# Patient Record
Sex: Male | Born: 1940
Health system: Southern US, Community
[De-identification: ages and names within clinical notes are randomized; demographics above are authoritative.]

## PROBLEM LIST (undated history)

## (undated) ENCOUNTER — Ambulatory Visit: Admission: EM

## (undated) DIAGNOSIS — T8859XA Other complications of anesthesia, initial encounter: Secondary | ICD-10-CM

## (undated) DIAGNOSIS — I509 Heart failure, unspecified: Secondary | ICD-10-CM

## (undated) DIAGNOSIS — C44319 Basal cell carcinoma of skin of other parts of face: Secondary | ICD-10-CM

## (undated) DIAGNOSIS — C61 Malignant neoplasm of prostate: Secondary | ICD-10-CM

## (undated) DIAGNOSIS — T4145XA Adverse effect of unspecified anesthetic, initial encounter: Secondary | ICD-10-CM

## (undated) DIAGNOSIS — C189 Malignant neoplasm of colon, unspecified: Secondary | ICD-10-CM

## (undated) DIAGNOSIS — M1990 Unspecified osteoarthritis, unspecified site: Secondary | ICD-10-CM

## (undated) DIAGNOSIS — IMO0002 Reserved for concepts with insufficient information to code with codable children: Secondary | ICD-10-CM

## (undated) DIAGNOSIS — H6120 Impacted cerumen, unspecified ear: Secondary | ICD-10-CM

## (undated) DIAGNOSIS — K2210 Ulcer of esophagus without bleeding: Secondary | ICD-10-CM

## (undated) DIAGNOSIS — I480 Paroxysmal atrial fibrillation: Secondary | ICD-10-CM

## (undated) HISTORY — DX: Reserved for concepts with insufficient information to code with codable children: IMO0002

## (undated) HISTORY — DX: Unspecified osteoarthritis, unspecified site: M19.90

## (undated) HISTORY — DX: Ulcer of esophagus without bleeding: K22.10

## (undated) HISTORY — DX: Basal cell carcinoma of skin of other parts of face: C44.319

## (undated) HISTORY — DX: Malignant neoplasm of prostate: C61

## (undated) HISTORY — DX: Paroxysmal atrial fibrillation: I48.0

## (undated) HISTORY — PX: CATARACT EXTRACTION: SUR2

## (undated) HISTORY — DX: Heart failure, unspecified: I50.9

## (undated) HISTORY — PX: BASAL CELL CARCINOMA EXCISION: SHX1214

## (undated) HISTORY — DX: Malignant neoplasm of colon, unspecified: C18.9

---

## 1996-11-12 DIAGNOSIS — C61 Malignant neoplasm of prostate: Secondary | ICD-10-CM

## 1996-11-12 HISTORY — DX: Malignant neoplasm of prostate: C61

## 1996-11-12 HISTORY — PX: RADIOACTIVE SEED IMPLANT: SHX5150

## 1999-01-11 HISTORY — PX: COLONOSCOPY: SHX174

## 1999-01-17 ENCOUNTER — Ambulatory Visit (HOSPITAL_COMMUNITY): Admission: RE | Admit: 1999-01-17 | Discharge: 1999-01-17 | Payer: Self-pay | Admitting: Gastroenterology

## 1999-01-17 LAB — HM COLONOSCOPY

## 2001-09-02 ENCOUNTER — Encounter: Payer: Self-pay | Admitting: Internal Medicine

## 2001-09-02 ENCOUNTER — Inpatient Hospital Stay (HOSPITAL_COMMUNITY): Admission: EM | Admit: 2001-09-02 | Discharge: 2001-09-05 | Payer: Self-pay | Admitting: Emergency Medicine

## 2001-09-05 ENCOUNTER — Encounter: Payer: Self-pay | Admitting: Internal Medicine

## 2001-10-02 ENCOUNTER — Encounter: Payer: Self-pay | Admitting: "Specialist/Technologist

## 2001-10-02 ENCOUNTER — Encounter: Admission: RE | Admit: 2001-10-02 | Discharge: 2001-10-02 | Payer: Self-pay | Admitting: "Specialist/Technologist

## 2001-11-17 ENCOUNTER — Encounter: Admission: RE | Admit: 2001-11-17 | Discharge: 2001-11-17 | Payer: Self-pay

## 2002-07-27 ENCOUNTER — Ambulatory Visit (HOSPITAL_BASED_OUTPATIENT_CLINIC_OR_DEPARTMENT_OTHER): Admission: RE | Admit: 2002-07-27 | Discharge: 2002-07-27 | Payer: Self-pay | Admitting: Surgery

## 2002-07-27 ENCOUNTER — Encounter (INDEPENDENT_AMBULATORY_CARE_PROVIDER_SITE_OTHER): Payer: Self-pay | Admitting: Specialist

## 2002-11-12 HISTORY — PX: SHOULDER SURGERY: SHX246

## 2004-03-24 ENCOUNTER — Observation Stay (HOSPITAL_COMMUNITY): Admission: RE | Admit: 2004-03-24 | Discharge: 2004-03-25 | Payer: Self-pay | Admitting: Orthopedic Surgery

## 2005-09-19 ENCOUNTER — Encounter: Admission: RE | Admit: 2005-09-19 | Discharge: 2005-09-19 | Payer: Self-pay | Admitting: Internal Medicine

## 2005-11-12 HISTORY — PX: INGUINAL HERNIA REPAIR: SUR1180

## 2005-11-12 HISTORY — PX: CARDIAC CATHETERIZATION: SHX172

## 2005-12-06 ENCOUNTER — Encounter: Admission: RE | Admit: 2005-12-06 | Discharge: 2005-12-06 | Payer: Self-pay | Admitting: Internal Medicine

## 2006-03-27 ENCOUNTER — Emergency Department (HOSPITAL_COMMUNITY): Admission: EM | Admit: 2006-03-27 | Discharge: 2006-03-27 | Payer: Self-pay | Admitting: Emergency Medicine

## 2006-04-02 ENCOUNTER — Ambulatory Visit (HOSPITAL_COMMUNITY): Admission: RE | Admit: 2006-04-02 | Discharge: 2006-04-02 | Payer: Self-pay | Admitting: Surgery

## 2006-04-14 ENCOUNTER — Encounter: Payer: Self-pay | Admitting: Emergency Medicine

## 2006-04-14 ENCOUNTER — Inpatient Hospital Stay (HOSPITAL_COMMUNITY): Admission: AD | Admit: 2006-04-14 | Discharge: 2006-04-20 | Payer: Self-pay | Admitting: Cardiology

## 2006-04-18 ENCOUNTER — Encounter (INDEPENDENT_AMBULATORY_CARE_PROVIDER_SITE_OTHER): Payer: Self-pay | Admitting: Cardiology

## 2008-11-12 HISTORY — PX: CARDIOVERSION: SHX1299

## 2009-09-25 ENCOUNTER — Inpatient Hospital Stay (HOSPITAL_COMMUNITY): Admission: EM | Admit: 2009-09-25 | Discharge: 2009-09-26 | Payer: Self-pay | Admitting: Emergency Medicine

## 2009-11-01 ENCOUNTER — Ambulatory Visit (HOSPITAL_COMMUNITY): Admission: RE | Admit: 2009-11-01 | Discharge: 2009-11-01 | Payer: Self-pay | Admitting: Cardiovascular Disease

## 2009-12-12 ENCOUNTER — Encounter: Payer: Self-pay | Admitting: Cardiovascular Disease

## 2010-01-24 ENCOUNTER — Telehealth: Payer: Self-pay | Admitting: Cardiovascular Disease

## 2010-01-25 DIAGNOSIS — I509 Heart failure, unspecified: Secondary | ICD-10-CM | POA: Insufficient documentation

## 2010-01-25 DIAGNOSIS — C61 Malignant neoplasm of prostate: Secondary | ICD-10-CM | POA: Insufficient documentation

## 2010-01-25 DIAGNOSIS — K4020 Bilateral inguinal hernia, without obstruction or gangrene, not specified as recurrent: Secondary | ICD-10-CM | POA: Insufficient documentation

## 2010-01-25 DIAGNOSIS — M889 Osteitis deformans of unspecified bone: Secondary | ICD-10-CM | POA: Insufficient documentation

## 2010-02-02 ENCOUNTER — Ambulatory Visit: Payer: Self-pay | Admitting: Cardiovascular Disease

## 2010-02-02 LAB — CONVERTED CEMR LAB

## 2010-02-10 ENCOUNTER — Ambulatory Visit: Payer: Self-pay | Admitting: Cardiovascular Disease

## 2010-02-10 DIAGNOSIS — I480 Paroxysmal atrial fibrillation: Secondary | ICD-10-CM | POA: Insufficient documentation

## 2010-02-13 LAB — CONVERTED CEMR LAB
ALT: 16 units/L (ref 0–53)
CO2: 27 meq/L (ref 19–32)
Calcium: 9.4 mg/dL (ref 8.4–10.5)
Chloride: 105 meq/L (ref 96–112)
Creatinine, Ser: 0.99 mg/dL (ref 0.40–1.50)
Glucose, Bld: 80 mg/dL (ref 70–99)
TSH: 2.126 microintl units/mL (ref 0.350–4.500)
Total Bilirubin: 0.6 mg/dL (ref 0.3–1.2)

## 2010-05-08 ENCOUNTER — Encounter: Payer: Self-pay | Admitting: Cardiovascular Disease

## 2010-10-16 ENCOUNTER — Ambulatory Visit: Payer: Self-pay | Admitting: Cardiovascular Disease

## 2010-12-10 LAB — CONVERTED CEMR LAB
ALT: 19 units/L (ref 0–53)
AST: 21 units/L (ref 0–37)
Bilirubin, Direct: 0.2 mg/dL (ref 0.0–0.3)
Indirect Bilirubin: 0.7 mg/dL (ref 0.0–0.9)
PSA: 0.07 ng/mL (ref <=4.00)
TSH: 2.278 microintl units/mL (ref 0.350–4.500)
Total Bilirubin: 0.9 mg/dL (ref 0.3–1.2)
VLDL: 11 mg/dL (ref 0–40)

## 2010-12-14 NOTE — Assessment & Plan Note (Signed)
Summary: NP6/AMD   Visit Type:  New Patient Referring Provider:  Gollan Primary Provider:  n/a  CC:  no complaints.  History of Present Illness: [name] is a very pleasant 70 year old gentleman, known to me from [facility] heart and vascular [facility], history of atrial fibrillation with cardioversion in December of 2010 who presents to establish care.  [name] has had 2 episodes of fibrillation, in December and then several years prior to that that converted with medical management. His most recent episode did not convert with amiodarone loading and required cardioversion. He has been maintained on low-dose metoprolol tartrate and low-dose amiodarone has been doing well. He has noticed some gum bleeding and blood in his stools and he would like to stop his warfarin. He carefully monitors his heart rates and is symptomatic when in atrial fibrillation and does not feel that he has had any episodes whatsoever.  He otherwise feels well, exercises on a daily basis. He believes his last cholesterol was 170.   he had a cardiac catheterization in June 2007 that showed no significant coronary artery disease. Ejection fraction at that time was 45-55% and he was in atrial fibrillation with a rapid rate.    Preventive Screening-Counseling & Management  Alcohol-Tobacco     Smoking Status: never  Caffeine-Diet-Exercise     Does Patient Exercise: yes      Drug Use:  no.    Current Problems (verified): 1)  Paget's Disease  (ICD-731.0) 2)  Prostate Cancer  (ICD-185) 3)  Inguinal Hernias, Bilateral  (ICD-550.92) 4)  CHF  (ICD-428.0)  Current Medications (verified): 1)  Metoprolol Tartrate 25 Mg Tabs (Metoprolol Tartrate) .... 1/2  Tablet By Mouth Twice A Day 2)  Furosemide 20 Mg Tabs (Furosemide) .... Take One Tablet By Mouth Daily As Needed 3)  Warfarin Sodium 5 Mg Tabs (Warfarin Sodium) .... Use As Directed By Anticoagulation Clinic 4)  Multivitamins  Tabs (Multiple Vitamin) .Marland Kitchen.. 1 Tab  By Mouth Daily 5)  Vitamin D (Ergocalciferol) 50000 Unit Caps (Ergocalciferol) .Marland Kitchen.. 1 Tab Weekly 6)  Amiodarone Hcl 200 Mg Tabs (Amiodarone Hcl) .... Take One Tablet By Mouth Daily 7)  Omega-3 & Omega-6 Fish Oil  Caps (Omega 3-6-9 Fatty Acids) .Marland Kitchen.. 1 By Mouth Two Times A Day 8)  Mag-Ox 400 400 Mg Tabs (Magnesium Oxide) .Marland Kitchen.. 1 By Mouth Two Times A Day  Allergies (verified): No Known Drug Allergies  Past History:  Past Medical History: Last updated: 01/25/2010 Atrial Fibrillation -- DCCV 11/01/09 inguinal hernia -- repair 2007 Congestive heart failure secondary to atrial fibrillation with diastolic dysfunction History of Paget disease in remission  History of prostate cancer in remission Anticoagulation on Coumadin therapy  Past Surgical History: Last updated: 01/25/2010 inguinal hernia repair 1981, 2007 cardiac cath- 2007 -- clean  Family History: Mother: deceased 49: ?a-fib Father: deceased 4: stroke  Social History: Tobacco Use - No.  Alcohol Use - yes Regular Exercise - yes Drug Use - no Retired  Married  Smoking Status:  never Does Patient Exercise:  yes Drug Use:  no  Review of Systems  The patient denies fever, weight loss, weight gain, vision loss, decreased hearing, hoarseness, chest pain, syncope, dyspnea on exertion, peripheral edema, prolonged cough, abdominal pain, incontinence, muscle weakness, depression, and enlarged lymph nodes.    Vital Signs:  Patient profile:   70 year old male Height:      71 inches Weight:      195.50 pounds BMI:     27.37 Pulse rate:  51 / minute Pulse rhythm:   regular BP sitting:   146 / 86  (left arm) Cuff size:   large  Vitals Entered By: Mercer Pod (February 10, 2010 11:10 AM)  Physical Exam  General:  well-appearing gentleman in no apparent distress, HEENT exam is benign, neck is supple with no JVP or carotid bruits, heart sounds are regular with S1-S2 and no murmurs appreciated, lungs are clear to  auscultation with no wheezes Rales, abdominal exam is benign, no significant lower extremity edema, neurologic exam is grossly nonfocal, skin is warm and dry.pulses are equal and symmetrical in his upper and lower extremities.    Impression & Recommendations:  Problem # 1:  ATRIAL FIBRILLATION (ICD-427.31) maintaining normal sinus rhythm after it to fibrillation with DC cardioversion in December 2010. Previous episode of atrial fibrillation in June 2007.  We will decrease his amiodarone to 100 mg daily. He was hoping to stop his amiodarone altogether though he does not want to go back into atrial fibrillation. We will compromise and do a low dose for several months. We will check his LFTs and TSH today. We'll continue him on a low-dose metoprolol tartrate b.i.d. We have suggested to him that he could come off warfarin as his nature fibrillation has been very rare and he has been holding sinus rhythm.  He'll contact us if he has any palpitations concerning for atrial fibrillation. We have suggested to him that he take some amiodarone with him when he travels in case he does convert back to atrial fibrillation. His updated medication list for this problem includes:    Metoprolol Tartrate 25 Mg Tabs (Metoprolol tartrate) .Marland Kitchen... 1/2  tablet by mouth twice a day    Warfarin Sodium 5 Mg Tabs (Warfarin sodium) ..... Use as directed by anticoagulation clinic    Amiodarone Hcl 200 Mg Tabs (Amiodarone hcl) .Marland Kitchen... Take one tablet by mouth daily  Appended Document: NP6/AMD EKG in November 25, 2009 shows normal sinus rhythm, rate 52 beats per minute with no significant ST or T wave changes.  Appended Document: NP6/AMD    Clinical Lists Changes  Orders: Added new Test order of T-Comprehensive Metabolic Panel [phone]) - Signed Added new Test order of T-TSH [phone]) - Signed

## 2010-12-14 NOTE — Medication Information (Signed)
Summary: Coumadin Clinic  Anticoagulant Therapy  Managed by: Inactive PCP: n/a Supervising MD: gollan Indication 1: Atrial Fibrillation Lab Used: [facility] [place] Site: [place] INR RANGE 2.0-3.0          Comments: Per Dr Windell Hummingbird office note 02/10/10 pt is now off of coumadin.  Allergies: No Known Drug Allergies  Anticoagulation Management History:      Positive risk factors for bleeding include an age of 72 years or older.  The bleeding index is 'intermediate risk'.  Positive CHADS2 values include History of CHF.  Negative CHADS2 values include Age > 72 years old.  Anticoagulation responsible provider: gollan.    Anticoagulation Management Assessment/Plan:      The next INR is due 02/09/2010.  Anticoagulation instructions were given to patient.  Results were reviewed/authorized by Inactive.         Prior Anticoagulation Instructions: coumadin 2.5 mg daily with 5 mg on Tues and Thurs

## 2010-12-14 NOTE — Medication Information (Signed)
Summary: CCR/AMD  Anticoagulant Therapy  Managed by: Charlena Cross, RN, BSN Supervising MD: Mariah Milling Indication 1: Atrial Fibrillation Lab Used: [facility] [place] Site: [place] INR POC 1.2 INR RANGE 2.0-3.0  Dietary changes: no    Health status changes: no    Bleeding/hemorrhagic complications: no    Recent/future hospitalizations: no    Any changes in medication regimen? no    Recent/future dental: no  Any missed doses?: no       Is patient compliant with meds? yes      Comments: had bleeding issues on 5 mg daily  Anticoagulation Management History:      The patient comes in today for his initial visit for anticoagulation therapy.  Positive risk factors for bleeding include an age of 72 years or older.  The bleeding index is 'intermediate risk'.  Positive CHADS2 values include History of CHF.  Negative CHADS2 values include Age > 63 years old.  Anticoagulation responsible provider: gollan.  INR POC: 1.2.    Anticoagulation Management Assessment/Plan:      The patient's current anticoagulation dose is Warfarin sodium 5 mg tabs: Use as directed by Anticoagulation Clinic.  The next INR is due 02/09/2010.  Anticoagulation instructions were given to patient.  Results were reviewed/authorized by Charlena Cross, RN, BSN.  He was notified by Charlena Cross, RN, BSN.         Current Anticoagulation Instructions: coumadin 2.5 mg daily with 5 mg on Tues and Thurs

## 2010-12-14 NOTE — Assessment & Plan Note (Signed)
Summary: ROV/AMD   Visit Type:  Follow-up Referring Provider:  Mariah Milling Primary Provider:  n/a  CC:  Denies chest pain, SOB, and or palpitations..  History of Present Illness: [name] [name] is a very pleasant 70 year old gentleman, known to me from [facility] vascular [facility], history of atrial fibrillation with cardioversion in December of 2010 who presents 4 routine followup.  [name] [name] has had 2 episodes of fibrillation, in December 2010 and then several years prior to that that converted with medical management. His most recent episode did not convert with amiodarone loading and required cardioversion. He has been maintained on low-dose metoprolol tartrate and low-dose amiodarone has been doing well.  He carefully monitors his heart rates and is symptomatic when in atrial fibrillation and does not feel that he has had any episodes whatsoever. he would like to discontinue the warfarin.  He otherwise feels well, exercises on a daily basis.   he had a cardiac catheterization in June 2007 that showed no significant coronary artery disease. Ejection fraction at that time was 45-55% and he was in atrial fibrillation with a rapid rate.  EKG shows normal sinus rhythm with a rate of 56 beats per minute, no significant ST or T wave changes    Current Medications (verified): 1)  Metoprolol Tartrate 25 Mg Tabs (Metoprolol Tartrate) .... 1/2  Tablet Daily 2)  Multivitamins  Tabs (Multiple Vitamin) .Marland Kitchen.. 1 Tab By Mouth Daily 3)  Vitamin D (Ergocalciferol) 50000 Unit Caps (Ergocalciferol) .Marland Kitchen.. 1 Tab Weekly 4)  Amiodarone Hcl 200 Mg Tabs (Amiodarone Hcl) .... Take 1/2 Tablet Daily 5)  Omega-3 & Omega-6 Fish Oil  Caps (Omega 3-6-9 Fatty Acids) .Marland Kitchen.. 1 By Mouth Two Times A Day 6)  Mag-Ox 400 400 Mg Tabs (Magnesium Oxide) .Marland Kitchen.. 1 By Mouth Two Times A Day 7)  Multivitamins  Caps (Multiple Vitamin) .Marland Kitchen.. 1 Tablet Daily  Allergies (verified): No Known Drug Allergies  Past History:  Past Medical  History: Last updated: 01/25/2010 Atrial Fibrillation -- DCCV 11/01/09 inguinal hernia -- repair 2007 Congestive heart failure secondary to atrial fibrillation with diastolic dysfunction History of Paget disease in remission  History of prostate cancer in remission Anticoagulation on Coumadin therapy  Past Surgical History: Last updated: 01/25/2010 inguinal hernia repair 1981, 2007 cardiac cath- 2007 -- clean  Family History: Last updated: 2010/02/12 Mother: deceased 92: ?a-fib Father: deceased 70: stroke  Social History: Last updated: February 12, 2010 Tobacco Use - No.  Alcohol Use - yes Regular Exercise - yes Drug Use - no Retired  Married   Risk Factors: Exercise: yes (02-12-2010)  Risk Factors: Smoking Status: never (02-12-2010)  Review of Systems  The patient denies fever, weight loss, weight gain, vision loss, decreased hearing, hoarseness, chest pain, syncope, dyspnea on exertion, peripheral edema, prolonged cough, abdominal pain, incontinence, muscle weakness, depression, and enlarged lymph nodes.    Vital Signs:  Patient profile:   70 year old male Height:      71 inches Weight:      201.50 pounds BMI:     28.21 Pulse rate:   56 / minute BP sitting:   128 / 72  (left arm) Cuff size:   regular  Vitals Entered By: Lysbeth Galas CMA (October 16, 2010 10:49 AM)  Physical Exam  General:  well-appearing gentleman in no apparent distress, HEENT exam is benign, neck is supple with no JVP or carotid bruits, heart sounds are regular with S1-S2 and no murmurs appreciated, lungs are clear to auscultation with no wheezes Rales,  abdominal exam is benign, no significant lower extremity edema, neurologic exam is grossly nonfocal, skin is warm and dry.pulses are equal and symmetrical in his upper and lower extremities. Skin:  Intact without lesions or rashes. Psych:  Normal affect.   Impression & Recommendations:  Problem # 1:  ATRIAL FIBRILLATION (ICD-427.31) is  maintaining normal sinus rhythm. We will keep his medications at their current doses. He takes amiodarone 100 mg in the morning, metoprolol tartrate 12.5 mg in the evening. We did suggest that he could add metoprolol in the morning though he would prefer not to. we had a long discussion about his medications.  His updated medication list for this problem includes:    Metoprolol Tartrate 25 Mg Tabs (Metoprolol tartrate) .Marland Kitchen... 1/2  tablet daily    Amiodarone Hcl 200 Mg Tabs (Amiodarone hcl) .Marland Kitchen... Take 1/2 tablet daily    Aspirin Ec 325 Mg Tbec (Aspirin) .Marland Kitchen... Take one tablet by mouth daily  Orders: T-Lipid Profile ([id number])  Problem # 2:  PAGET'S DISEASE (ICD-731.0) He does not have a primary care physician and we will check labs today including LFTs. Also check a PSA given a history of prostate cancer.  Problem # 3:  PREVENTIVE HEALTH CARE (ICD-V70.0) we will check cholesterol, LFTs, vitamin D at his request as he does not have a primary care physician.  Other Orders: T-Hepatic Function [phone] T-TSH [phone]) T-Vitamin D (25-Hydroxy) [id number]) T-PSA [phone])  Patient Instructions: 1)  Your physician recommends that you schedule a follow-up appointment in: 1 year 2)  Your physician recommends that you continue on your current medications as directed. Please refer to the Current Medication list given to you today.

## 2010-12-14 NOTE — Letter (Signed)
Summary: Medical Record Release  Medical Record Release   Imported By: Harlon Flor 02/03/2010 08:33:19  _____________________________________________________________________  External Attachment:    Type:   Image     Comment:   External Document

## 2010-12-14 NOTE — Progress Notes (Signed)
Summary: Questions  Phone Note Call from Patient Call back at Home Phone [phone]   Caller: Patient Call For: Dr. Mariah Milling Summary of Call: Patient said he called earlier this morning regarding whether or not he could take advil.  He was not in EMR earlier, but a note was sent to RN the 1st time patient called with question . Please call patient back. Initial call taken by: [place],  January 24, 2010 1:53 PM  Follow-up for Phone Call        per Dr. Mariah Milling- no OTC with decongestant; tylenol, advil, moist heat. pt aware. Charlena Cross RN BSN

## 2010-12-14 NOTE — Progress Notes (Signed)
Summary: PHI  PHI   Imported By: Harlon Flor 02/13/2010 13:35:34  _____________________________________________________________________  External Attachment:    Type:   Image     Comment:   External Document

## 2011-02-14 LAB — LIPID PANEL
Cholesterol: 161 mg/dL (ref 0–200)
HDL: 37 mg/dL — ABNORMAL LOW (ref >39)
LDL Cholesterol: 110 mg/dL — ABNORMAL HIGH (ref 0–99)
Total CHOL/HDL Ratio: 4.4 RATIO
Triglycerides: 72 mg/dL (ref <150)
VLDL: 14 mg/dL (ref 0–40)

## 2011-02-14 LAB — COMPREHENSIVE METABOLIC PANEL
AST: 80 U/L — ABNORMAL HIGH (ref 0–37)
Calcium: 8.8 mg/dL (ref 8.4–10.5)
Chloride: 110 mEq/L (ref 96–112)
Creatinine, Ser: 1.31 mg/dL (ref 0.4–1.5)
GFR calc Af Amer: >60 mL/min (ref >60)
Glucose, Bld: 103 mg/dL — ABNORMAL HIGH (ref 70–99)
Sodium: 142 mEq/L (ref 135–145)

## 2011-02-14 LAB — CBC
HCT: 41.3 % (ref 39.0–52.0)
HCT: 43 % (ref 39.0–52.0)
Hemoglobin: 15.1 g/dL (ref 13.0–17.0)
MCV: 98.3 fL (ref 78.0–100.0)
RBC: 4.37 MIL/uL (ref 4.22–5.81)
RDW: 13.5 % (ref 11.5–15.5)
RDW: 13.5 % (ref 11.5–15.5)

## 2011-02-14 LAB — CK TOTAL AND CKMB (NOT AT ARMC)
CK, MB: 2 ng/mL (ref 0.3–4.0)
Total CK: 55 U/L (ref 7–232)
Total CK: 63 U/L (ref 7–232)

## 2011-02-14 LAB — TROPONIN I: Troponin I: 0.01 ng/mL (ref 0.00–0.06)

## 2011-02-14 LAB — DIFFERENTIAL
Basophils Absolute: 0 10*3/uL (ref 0.0–0.1)
Basophils Relative: 0 % (ref 0–1)
Eosinophils Absolute: 0.1 10*3/uL (ref 0.0–0.7)
Eosinophils Relative: 1 % (ref 0–5)
Lymphocytes Relative: 20 % (ref 12–46)
Lymphs Abs: 1.4 10*3/uL (ref 0.7–4.0)
Monocytes Absolute: 0.6 10*3/uL (ref 0.1–1.0)
Monocytes Relative: 8 % (ref 3–12)
Neutrophils Relative %: 70 % (ref 43–77)

## 2011-02-14 LAB — CARDIAC PANEL(CRET KIN+CKTOT+MB+TROPI)
Relative Index: INVALID (ref 0.0–2.5)
Relative Index: INVALID (ref 0.0–2.5)
Troponin I: 0.02 ng/mL (ref 0.00–0.06)

## 2011-02-14 LAB — PROTIME-INR: INR: 2.19 — ABNORMAL HIGH (ref 0.00–1.49)

## 2011-02-14 LAB — BRAIN NATRIURETIC PEPTIDE: Pro B Natriuretic peptide (BNP): 704 pg/mL — ABNORMAL HIGH (ref 0.0–100.0)

## 2011-02-14 LAB — BASIC METABOLIC PANEL
BUN: 19 mg/dL (ref 6–23)
Calcium: 7.9 mg/dL — ABNORMAL LOW (ref 8.4–10.5)
Chloride: 103 mEq/L (ref 96–112)
Creatinine, Ser: 1.08 mg/dL (ref 0.4–1.5)
GFR calc Af Amer: >60 mL/min (ref >60)
Potassium: 3.2 mEq/L — ABNORMAL LOW (ref 3.5–5.1)

## 2011-02-14 LAB — MAGNESIUM: Magnesium: 2 mg/dL (ref 1.5–2.5)

## 2011-02-14 LAB — TSH: TSH: 2.056 u[IU]/mL (ref 0.350–4.500)

## 2011-03-10 ENCOUNTER — Telehealth: Payer: Self-pay | Admitting: Physician Assistant

## 2011-03-10 NOTE — Telephone Encounter (Signed)
Amio 200mg  am, Lopressor 25mg , 1/2 tab pm are PTA meds. Pt noted onset tachypalps approx 7pm, has orders to take Amio 400 and Lopressor 25mg  for HR and Coum 5mg  and Lasix 20mg  for blood thinner/fluid. Took Rx about 7:30 but HR has not come down. HR currently 140s. Feels weak but no CP or SOB. Pt prefers outpatient management. Advised him to increase Amio to 400mg  BID and Lopressor 25mg  TID. He is to hold the Lopressor and Lasix for SBP < 100.

## 2011-03-12 ENCOUNTER — Encounter: Payer: Self-pay | Admitting: Cardiovascular Disease

## 2011-03-12 ENCOUNTER — Ambulatory Visit (INDEPENDENT_AMBULATORY_CARE_PROVIDER_SITE_OTHER): Payer: Medicare Other | Admitting: Cardiovascular Disease

## 2011-03-12 DIAGNOSIS — I4891 Unspecified atrial fibrillation: Secondary | ICD-10-CM

## 2011-03-12 DIAGNOSIS — I509 Heart failure, unspecified: Secondary | ICD-10-CM

## 2011-03-12 MED ORDER — DABIGATRAN ETEXILATE MESYLATE 150 MG PO CAPS: 150.0000 mg | ORAL_CAPSULE | Freq: Two times a day (BID) | ORAL | Status: DC

## 2011-03-12 MED ORDER — FUROSEMIDE 20 MG PO TABS: 20.0000 mg | ORAL_TABLET | Freq: Two times a day (BID) | ORAL | Status: DC

## 2011-03-12 MED ORDER — AMIODARONE HCL 400 MG PO TABS: 400.0000 mg | ORAL_TABLET | Freq: Two times a day (BID) | ORAL | Status: DC

## 2011-03-12 NOTE — Assessment & Plan Note (Signed)
No symptoms of CHF as his atrial fibrillation converted relatively quickly. He will take Lasix p.r.n. For additional episodes of atrial fibrillation.Marland Kitchen

## 2011-03-12 NOTE — Assessment & Plan Note (Signed)
Episode of atrial fibrillation this past weekend, converting with high-dose amiodarone. We have suggested he go back to amiodarone 100 mg daily. He is concerned about the higher dose of 200 mg daily and he would like to stay on the lower dose. I suggested he try to retake the metoprolol 12.5 mg daily. He has not been able to tolerate high doses secondary to bradycardia. If he has a repeat episode, I suggested he take pradaxa.

## 2011-03-12 NOTE — Progress Notes (Signed)
   Patient ID: [name] [name], male    DOB: 1941-03-07, 70 y.o.   MRN: [id number]  HPI Comments: [name] [name] is a very pleasant 70 year old gentleman, history of atrial fibrillation with cardioversion in December of 2010 who presents After a recent episode of atrial fibrillation.  He reports that he was at church on Saturday evening at a dinner when he developed atrial fibrillation with ventricular rate in the 140-160 range. He took amiodarone 400 mg that evening with metoprolol, Lasix. He took a second dose of amiodarone in the morning and converted back to normal sinus rhythm on Sunday morning. He has felt well since that time and has continued on amiodarone 400 mg b.i.d., taking one this morning.  He currently feels well and has no other complaints. He does report having bradycardia on metoprolol 12.5 mg in the evening and took himself off the medication and was only taking amiodarone 100 mg daily.   He otherwise feels well, exercises on a daily basis.     he had a cardiac catheterization in June 2007 that showed no significant coronary artery disease. Ejection fraction at that time was 45-55% and he was in atrial fibrillation with a rapid rate.   EKG shows normal sinus rhythm with a rate of 59 beats per minute, no significant ST or T wave changes      Review of Systems  Constitutional: Negative.   HENT: Negative.   Eyes: Negative.   Respiratory: Negative.   Cardiovascular: Negative.   Gastrointestinal: Negative.   Musculoskeletal: Negative.   Skin: Negative.   Neurological: Negative.   Hematological: Negative.   Psychiatric/Behavioral: Negative.   All other systems reviewed and are negative.    BP 110/82  Pulse 60  Ht 5\' 11"  (1.803 m)  Wt 200 lb (90.719 kg)  BMI 27.89 kg/m2  Physical Exam  Nursing note and vitals reviewed. Constitutional: He is oriented to person, place, and time. He appears well-developed and well-nourished.  HENT:  Head: Normocephalic.  Nose: Nose  normal.  Mouth/Throat: Oropharynx is clear and moist.  Eyes: Conjunctivae are normal. Pupils are equal, round, and reactive to light.  Neck: Normal range of motion. Neck supple. No JVD present.  Cardiovascular: Normal rate, regular rhythm, S1 normal, S2 normal, normal heart sounds and intact distal pulses.  Exam reveals no gallop and no friction rub.   No murmur heard. Pulmonary/Chest: Effort normal and breath sounds normal. No respiratory distress. He has no wheezes. He has no rales. He exhibits no tenderness.  Abdominal: Soft. Bowel sounds are normal. He exhibits no distension. There is no tenderness.  Musculoskeletal: Normal range of motion. He exhibits no edema and no tenderness.  Lymphadenopathy:    He has no cervical adenopathy.  Neurological: He is alert and oriented to person, place, and time. Coordination normal.  Skin: Skin is warm and dry. No rash noted. No erythema.  Psychiatric: He has a normal mood and affect. His behavior is normal. Judgment and thought content normal.           Assessment and Plan

## 2011-03-12 NOTE — Patient Instructions (Addendum)
You are doing well. Consider restarting metoprolol 1/2 dose.  Continue amiodarone 400 mg daily.  Start Pradaxa 150mg  take one tablet in AM and in PM. Please call us if you have new issues that need to be addressed before your next appt.  We will call you for a follow up Appt. In 6 months

## 2011-03-30 NOTE — Op Note (Signed)
[name] [name], NICKERSON                         ACCOUNT NO.:  [id number]   MEDICAL RECORD NO.:  [id number]                   PATIENT TYPE:  AMB   LOCATION:  DAY                                  FACILITY:  [facility]   PHYSICIAN:  Georges Lynch. Darrelyn Hillock, M.D.             DATE OF BIRTH:  Sep 26, 1941   DATE OF PROCEDURE:  03/24/2004  DATE OF DISCHARGE:                                 OPERATIVE REPORT   SURGEON:  Georges Lynch. Darrelyn Hillock, M.D.   ASSISTANT:  Nurse.   PREOPERATIVE DIAGNOSIS:  Traumatic complete retracted tear of the right  rotator cuff tendon.   POSTOPERATIVE DIAGNOSIS:  Traumatic complete retracted tear of the right  rotator cuff tendon.   OPERATION:  1. Partial acromionectomy with acromioplasty.  2. Repair of the complete retracted tear of the rotator cuff tendon on the     right; utilizing a graft jacket, tendon graft and three multi tack     sutures.   DESCRIPTION OF PROCEDURE:  Under general anesthesia, the patient first had 1  g of IV Ancef.  A routine orthopedic prepping and draping of the right  shoulder was carried out.  Incision was made over the anterior aspect of the  right shoulder, bleeders were identified and cauterized.  At this time a  deltoid tendon was stripped from the acromion in usual fashion by sharp  dissection.  I split the proximal portion of the deltoid muscle.  I then  exposed the acromion.  I did a partial acromionectomy and acromioplasty in  the usual fashion.  I then examined the rotator cuff tendon; it was  completely torn and retracted.  I then utilized the bur to bur down lateral  articular surfaces of the humerus, to remove the cartilage, so we could get  good bleeding bone for the graft to take.  I then inserted three multi tack  sutures into the proximal humerus, and then utilized a clamp to bring my  rotator cuff forward, and sutured a 5 x 5 double-thickness graft jacket into  the tendon and into the bone.  I thoroughly irrigated out the area.  We  had  a nice repair.  I then reapproximated the deltoid tendon muscle in the usual  fashion.  I injected 20 cc of 0.5% Marcaine with epinephrine into the wound  site, because we were unable to give him an interscalene block  preoperatively.  The remaining part of the wound was closed in the usual  fashion.  The skin was closed with metal staples.  A sterile Neosporin  dressing was applied.   The patient left the operating room in satisfactory condition.                                               Ronald A. Darrelyn Hillock, M.D.  RAG/MEDQ  D:  03/24/2004  T:  03/24/2004  Job:  [id number]

## 2011-03-30 NOTE — Op Note (Signed)
[name] [name], [name] [name]               ACCOUNT NO.:  [id number]   MEDICAL RECORD NO.:  [id number]          PATIENT TYPE:  AMB   LOCATION:  DAY                          FACILITY:  [facility]   PHYSICIAN:  Thornton Park. Daphine Deutscher, MD  DATE OF BIRTH:  05/31/1941   DATE OF PROCEDURE:  04/02/2006  DATE OF DISCHARGE:                                 OPERATIVE REPORT   PREOPERATIVE DIAGNOSIS:  Recurrent left inguinal hernia.   POSTOPERATIVE DIAGNOSIS:  Left incarcerated femoral hernia.   PROCEDURE:  Repair of incarcerated left femoral hernia with mesh.   SURGEON:  Thornton Park. Daphine Deutscher, MD   ASSISTANT:  None.   ANESTHESIA:  General.   SPECIMENS:  None.   ESTIMATED BLOOD LOSS:  Minimal.   DESCRIPTION OF PROCEDURE:  Malaky Tetrault was seen by me in the office about  a week ago after having presented with an incarcerated hernia the evening  before.  This was partially reduced, but he has continued to have a bulge in  his left inguinal region.  He had had a previous repair of the hernia by Dr.  Kendrick Ranch in the 1980s and I had repaired the right inguinal hernia with  mesh just a few years ago.   He was taken to room #1 on Apr 02, 2006 and given general anesthesia.  The  abdomen was prepped with chlorhexidine and draped in a sterile fashion.  A  small oblique incision was made down paralleling where his cord structures  would be and I went down through fatty tissue, bluntly dissecting and found  the cord structures and felt a very prominent bulge and encircle my finger  around it and found about 4 cm spherical mass mainly comprised of fat that  was being extruded through a defect that appeared to be beneath the inguinal  ligament.  As I studied this and mobilized the cord, this appeared to be an  incarcerated femoral hernia.  I elected to free this up near the neck and  then gently reduce this manually and was able to get this back up into the  abdomen.  With my finger in there, I felt for other hernias  and could not  feel any other preperitoneal defects.  I could not feel an indirect hernia  or a direct hernia.  Nevertheless, I skeletonized the cord and did not see  any indirect hernias.  The internal ring appeared intact.  I elected to  repair this is with a piece of mesh, rolling about a centimeter-and-a-half  piece and sewing it together with a running 2-0 Prolene.  This was a flat  linear-type structure that the long length was pushed up into the abdomen.  I felt like that would be better than one of the puck-sized little  triangular things that come pre-made.  Prior to inserting this though, I  placed 2 sutures anteriorly and 1 suture posteriorly through what would be  Cooper's ligament and the other ones are through the inguinal ligament.  I  then inserted the mesh and used these 3 sutures to tack this so that it was  well-attached to the surrounding structures and hopefully and completely  obliterating that space.  The patient did awaken and cough during that  period of time and there did not appear to be any evidence of a bulge and  this was a crude test of the immediate repair.  We injected the area with  Marcaine.  I did not see any other  after that and any other mesh onlay I did not feel was indicated.  I closed  in layers with 4-0 Vicryl and with a running subcuticular 5-0 Vicryl,  Benzoin and Steri-Strips.  The patient seemed to tolerate the procedure  well.  He was given some Percocet 5/325, #30, to take for pain and will be  seen back in the office in 3-4 weeks.      Thornton Park Daphine Deutscher, MD  Electronically Signed     MBM/MEDQ  D:  04/02/2006  T:  04/03/2006  Job:  [id number]

## 2011-03-30 NOTE — H&P (Signed)
NAMEALDO, SONDGEROTH               ACCOUNT NO.:  [id number]   MEDICAL RECORD NO.:  [id number]          PATIENT TYPE:  INP   LOCATION:  4730                         FACILITY:  [facility]   PHYSICIAN:  Madaline Savage, M.D.DATE OF BIRTH:  08/28/1941   DATE OF ADMISSION:  04/14/2006  DATE OF DISCHARGE:                                HISTORY & PHYSICAL   CHIEF COMPLAINT:  Shortness of breath.   HISTORY OF PRESENT ILLNESS:  Mr. Meir is a 70 year old male who is  followed by Dr. Alwyn Ren and Dr. Holley Raring. In 2002, he had lone atrial  fibrillation. Echocardiogram showed normal LV function with a mildly dilated  left atrium. Exercise Cardiolite study showed no evidence of ischemia. The  patient was put on Coumadin for 3 months but developed some bleeding  problems, mainly minor problems. He asked to be taken off Coumadin. The  patient was taken off all medicines except for an aspirin and has done well.  In May of this year, he had a left inguinal hernia. This was repaired Apr 02, 2006 by Dr. Daphine Deutscher. He has been off aspirin since then. The patient  denies any typical chest pain but he has noticed for the last 3 days, some  increasing dyspnea on exertion and orthopnea for the last 24 hours. In the  emergency room, he is noted to be in atrial fibrillation with a rate of 140.  He is now controlled on Cardizem and feels better. In May of 2007, he was in  sinus rhythm with PVC's. CT scan done today in the emergency room,  preliminary, was negative for pulmonary embolism.   PAST MEDICAL HISTORY:  Remarkable for previous bilateral inguinal hernia  repair in 1981. 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 Cardell Peach in [facility]. He has had a right rotator cuff surgery in the past  by Dr. Darrelyn Hillock. He has a history of prostate cancer and had a seed implant  by Dr. Earlene Plater in the past.   CURRENT MEDICATIONS:  None.   ALLERGIES:  NO KNOWN DRUG ALLERGIES.   SOCIAL HISTORY:  The patient  is married. He [place] They have 3 children. He smokes cigars occasionally. He does  drink a glass or two of wine every day. 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   FAMILY HISTORY:  Remarkable for his father who died of a stroke in 50. His  mother had diabetes and heart failure.   REVIEW OF SYSTEMS:  Essentially unremarkable except for noted above. He has  noted occasional fatigue episodes but says that his heart rate was normal.  He has not had sustained palpitations or tachycardia.   PHYSICAL EXAMINATION:  VITAL SIGNS:  Blood pressure 108/60, pulse is now  100, temperature 97.3, respiratory rate 16.  GENERAL:  A well developed, well nourished man in no acute distress.  HEENT:  Normocephalic. Extraocular movements are intact. Sclerae nonicteric.  Conjunctivae are within normal limits.  NECK:  Without bruit, without JVD.  CHEST:  Reveals  few rales at the left base.  CARDIAC:  Examination revealed a regularly irregular rhythm without obvious  murmur, rub, or gallop.  ABDOMEN:  Nontender. No hepatosplenomegaly. He does have a healing surgical  site at the left inguinal area.  EXTREMITIES:  Without edema. Distal pulses are intact.  NEUROLOGIC:  Examination is grossly intact. He is awake, alert, oriented,  and cooperative. Moves all extremities without __________.   LABORATORY DATA:  BNP is 493, white count 93. Hemoglobin 13.9, hematocrit  40.5. Platelet count 218,000. Urinalysis is unremarkable. INR is 1.0. Sodium  141, potassium 4.2, BUN 14, creatinine 0.9, SGOT is 58, SGPT is 88, alkaline  phosphatase 294, bilirubin 1.4, troponin is less than 0.05.   IMPRESSION:  1.  Atrial fibrillation, recurrent.  2.  Congestive heart failure.  3.  Recent inguinal hernia repair.  4.  Elevated liver function studies, possible secondary to congestive heart      failure.  5.  History of Padgett's disease of the bone,  followed by Dr. Cardell Peach.  6.  History of prostate cancer, followed by Dr. Earlene Plater with a history of seed      implant in the past.   PLAN:  The patient will be admitted to telemetry. He will be seen by Dr.  Elsie Lincoln today. Will need to consider starting anticoagulants. We may want to  either repeat his Cardiolite or consider diagnostic catheterization before  Coumadin is started.      Abelino Derrick, P.A.    ______________________________  Madaline Savage, M.D.    Lenard Lance  D:  04/14/2006  T:  04/14/2006  Job:  [id number]

## 2011-03-30 NOTE — Cardiovascular Report (Signed)
NAMELAVARIUS, [name]               ACCOUNT NO.:  [id number]   MEDICAL RECORD NO.:  [id number]          PATIENT TYPE:  INP   LOCATION:  4730                         FACILITY:  [facility]   PHYSICIAN:  Madaline Savage, M.D.DATE OF BIRTH:  [birth year]   DATE OF PROCEDURE:  04/15/2006  DATE OF DISCHARGE:                              CARDIAC CATHETERIZATION   PROCEDURES PERFORMED:  1.  Selective coronary angiography by Judkins technique.  2.  Retrograde left heart catheterization.  3.  Left ventricular angiography.  4.  Right femoral Angio-Seal closure, successful.   ENTRY SITE:  Right femoral.   DYE USED:  Omnipaque.   MEDICATIONS GIVEN:  1.  Lidocaine.  2.  Digoxin 0.25 mg twice for a total of 0.5 mg.  3.  Intravenous Diltiazem drip at 10 mg per hour, no bolus.   PATIENT PROFILE:  [name] is a delightful 70 year old white married  gentleman who is a medical patient of Dr. Marga Melnick, and a cardiology  patient Dr. Harland Dingwall.  The patient had a history of lone atrial  fibrillation in the year 2002, and at that time a 2-D echocardiogram showed  normal valvular function and normal LVEF of 60%.  There was no evidence of  reversible ischemia on a Cardiolite stress test.  The patient was in his  usual state of health complicated by Paget's disease, prostate cancer status  post seed implantation and rotator cuff repair until 1-2 days prior to  admission to [facility] on April 14, 2006.  At that time, he  experienced shortness of breath, orthopnea and became very symptomatic right  around the time he presented to [facility] emergency room.  There, he was  found to be in atrial fibrillation with rapid ventricular response of 140 a  minute.  He did have some right chest burning pain with it.   PAST MEDICAL HISTORY:  Other past medical history that is pertinent is that  the patient had a left inguinal hernia repair 2 weeks ago and has bruising  of his left groin and  bruising at the surgical site but only minimal  swelling.   Today's cardiac catheterization was performed without complication and  results are described below.   PRESSURES:  The left ventricular pressure was 100/0, end-diastolic pressure  about 12.  Central aortic pressure was 100/60.  There was no significant  aortic valve gradient by pullback technique.   ANGIOGRAPHIC RESULTS:  The patient's coronary arteries were patent  throughout.  Anatomically, the left main coronary artery was medium in size.  The LAD coursed to the apex, giving rise to one diagonal branch.  There was  an intermediate ramus branch of medium size.  The circumflex contained 2  obtuse marginal branches, both of which were medium to even large in size.  The circumflex, however, was nondominant.  The right coronary artery was  dominant, giving rise to one acute marginal branch, a posterolateral branch  and a PDA, and all vessels in the coronary circulation were angiographically  patent.  No luminal irregularities were seen.   The LVEF was 45-55%.  This may in part be related to his heart rate of 126  and being in atrial fibrillation at the time of the injection.  I did a  right femoral artery injection with hand injection of Omnipaque and the  sheath placement was favorable for a closure device. I placed a 6-French  Angio-Seal in his right femoral artery with immediate complete hemostasis.  It was atraumatic with very little pain involved.  The patient reports that  this entire procedure was pleasant.   FINAL DIAGNOSIS:  1.  Angiographically patent coronary arteries.  2.  Left ventricular ejection fraction of 45-55%.  3.  Successful Angio-Seal closure of the right femoral artery.   PLAN:  The patient will need his ventricular response to his atrial  fibrillation slowed down.  He will need Lovenox to Coumadin crossover, and  he will return to the excellent care Dr. Shelva Majestic and Dr. Alwyn Ren after this   hospitalization.           ______________________________  Madaline Savage, M.D.     WHG/MEDQ  D:  04/15/2006  T:  04/15/2006  Job:  [id number]   cc:   Macarthur Critchley. Shelva Majestic, M.D.  Fax: [phone]   Titus Dubin. Alwyn Ren, M.D. [facility]  [phone] [address]  [place]  [place] [address]   Madaline Savage, M.D.  Fax: [phone]

## 2011-03-30 NOTE — Op Note (Signed)
NAMEWOOD, NOVACEK                         ACCOUNT NO.:  [id number]   MEDICAL RECORD NO.:  [id number]                   PATIENT TYPE:  AMB   LOCATION:  DSC                                  FACILITY:  [facility]   PHYSICIAN:  Thornton Park. Daphine Deutscher, M.D.             DATE OF BIRTH:  [birth year]   DATE OF PROCEDURE:  07/27/2002  DATE OF DISCHARGE:                                 OPERATIVE REPORT   CCS #[id number].   PREOPERATIVE DIAGNOSIS:  Right scrotal hernia.   POSTOPERATIVE DIAGNOSIS:  Right indirect scrotal hernia.   PROCEDURE:  Right inguinal herniorrhaphy with mesh.   SURGEON:  Thornton Park. Daphine Deutscher, M.D.   ANESTHESIA:  General.   DESCRIPTION OF PROCEDURE:  The patient was taken to room 8 and given general  anesthesia.  The region was shaved, prepped with Betadine, and draped  sterilely.  I infiltrated the area with 1% lidocaine-Marcaine mixture and  made an oblique incision.  I carried this down to the external oblique.  I  incised the external oblique along the fibers and identified the  ilioinguinal nerve branch and separated it with the superior flap.  I  mobilized the cord and then went proximally and found a very large sac.  I  carefully dissected this from the cord structures, not devascularizing them,  and when I had it up into the abdomen I twisted it off and ligated the apex  with a suture ligature of 2-0 silk.  This was a broad-based indirect hernia.  With my finger inside, I was able to feel the femoral vessels and everything  else seemed to be in order.  There was slight floor weakness.  Twisting this  down I put my suture above the top of the twist and excised the excess sac.  This retracted up into the abdomen.  I then repaired the floor with a piece  of Marlex mesh cut to fit and sutured along the inguinal ligament with a  running 2-0 Prolene carried above the internal ring laterally and then  carried above medially.  The two were overlapped and sutured in place with  horizontal mattresses of 2-0 Prolene.  This was tucked beneath the external  oblique.  The ilioinguinal nerve branch was allowed to return to an anatomic  position.  The external oblique was then closed with running 2-0 Vicryl.  Vicryl 4-0 was used in the subcutaneous space, and then the skin was closed  with interrupted subcuticular 5-0 Vicryl and Benzoin and Steri-Strips.  The  patient seemed to tolerate the procedure well and was taken to the recovery  room in satisfactory condition.                                               Thornton Park Daphine Deutscher, M.D.  MBM/MEDQ  D:  07/27/2002  T:  07/27/2002  Job:  [id number]   cc:   Titus Dubin. Alwyn Ren, M.D. [facility]

## 2011-03-30 NOTE — Discharge Summary (Signed)
[place]  Patient:    [name] [name] Visit Number: [id number] MRN: [id number]          Service Type: MED Location: 2000 2041 01 Attending Physician:  Dolores Patty Dictated by:   Cornell Barman, P.A. Admit Date:  09/02/2001 Disc. Date: 09/05/01   CC:         Mariah Milling, M.D., [facility], [place], [place]  Windy Fast L. Ovidio Hanger, M.D.  Titus Dubin. [name] [name], M.D. [facility], [place], [place]   Discharge Summary  DISCHARGE DIAGNOSES: 1. Paroxysmal atrial fibrillation. 2. Hematuria.  BRIEF ADMISSION HISTORY:  Mr. [name] [name] is a 70 year old white male who presented with rapid-onset atrial fibrillation.  Patient stated that he felt tight in his chest and like he was having difficulty swallowing his food.  The tightness lasted several hours.  He had no associated nausea or diaphoresis. The patients pulse was noted to be irregular by his wife.  The patient denied any exertional chest pain.  PAST MEDICAL HISTORY: 1. Pagets disease, followed by Dr. Mariah Milling in [facility]. 2. History of prostate cancer, status post seed implant in 1997 by    Dr. Windy Fast L. Davis III. 3. Status post herniorrhaphy in 1983. 4. Status post colonoscopy in 1999, which was negative.  HOSPITAL COURSE: #1 - CARDIOVASCULAR:  The patient presented with new-onset atrial fibrillation with a rapid ventricular response.  Patient was started on a Cardizem drip as well as heparin and Coumadin.  The patient did convert to sinus rhythm on Cardizem.  Currently, the patient is on oral Cardizem.  We did have cardiology see the patient; Dr. Veneda Melter saw the patient on September 04, 2001.  He reviewed the patients echo, which revealed mild mitral regurgitation and mild left atrial enlargement but normal LV function and no wall motion abnormalities.  He did agree to pursue a rest stress Cardiolite to rule out ischemia as a precipitating factor.  He did note that the patients  thyroid function tests were within normal limits.  The patient had a rest stress Cardiolite which was negative for ischemia and did reveal his EF to be 61%. We have converted the patient from heparin to Lovenox and will discharge the patient home with Lovenox and Coumadin and to follow up with Dr. Titus Dubin. Hopper next week.  #2 - UROLOGY:  The patient did develop some hematuria which was minimal and we will defer to Dr. Earlene [name] for further evaluation.  LABORATORY DATA AT DISCHARGE:  Pro time was 14.0.  INR was 1.1.  Hemoglobin 14, hematocrit 40.7, platelet count was 217,000.  PSA was 0.13.  Alkaline phosphatase was mildly elevated at 125.  Cardiac enzymes were negative.  Total cholesterol was 171, triglycerides 87, HDL 50, LDL was 104.  T4 was 6.9, T3 percent uptake was 39.2, TSH was 1.852.  MEDICATIONS AT DISCHARGE: 1. Lovenox 80 mg subcu every 12 hours for the next five days. 2. Coumadin 5 mg two tabs on Friday, 7.5 mg on Saturday and Sunday.  We will    have him follow up for a pro time on Monday. 3. Cardizem 240 mg q.d.  FOLLOWUP:  Follow up for a pro time and INR on Monday, October 28th, results to Dr. Alwyn [name] for management of his Coumadin dosing.  Follow up with Dr. Alwyn [name] in 7 to 10 days.  Follow up with Drs. [name] [name] and [place] as planned. Dictated by:   Cornell Barman, P.A. Attending Physician:  Dolores Patty DD:  09/05/01 TD:  09/05/01 Job: [id number]

## 2011-03-30 NOTE — H&P (Signed)
[name] [name]. [facility]  Patient:    [name] [name], [name] [name] Visit Number: [id number] MRN: [id number]          Service Type: MED Location: 2000 2041 01 Attending Physician:  Dolores Patty Dictated by:   Titus Dubin. Alwyn Ren, M.D. [facility] Admit Date:  09/02/2001   CC:         Coralyn Pear, M.D.             Ronald L. Ovidio Hanger, M.D.             James L. Randa Evens, M.D.                         History and Physical  HISTORY OF PRESENT ILLNESS:  [name] is a 70 year old white male who presented acutely September 02, 2001 with rapid onset atrial fibrillation.  The symptoms began approximately 6 p.m. September 01, 2001 during the evening meal.  He felt "tightness as if food was not going down."  The tightness lasted several hours.  He had no associated nausea or diaphoresis.  Irregular pulse was noted by his wife.  He took an aspirin at bedtime, but awoke approximately 1 a.m. with back pain which was relieved with repeat aspirin. He does have chronic neck and lower back pain at night, for which he will take Advil occasionally.  He also has had dyspepsia intermittently, for which he takes Tums.  He will also use a supplement acidophilus for diet-related dyspepsia.  He denies any exertional chest pain.  He has not been physically active recently.  He awoke with fatigue this morning; his irregular pulse persisted.  He states that "I feel great" and denied any chest pain at the time of the exam.  PAST HISTORY:  Pagets disease, for which he is followed by Dr. Mariah Milling in [facility].  He has also had prostate cancer, for which he had a seed implant in 1997 by Dr. Darvin Neighbours.  FAMILY HISTORY:  Positive for diabetes and dysrhythmia in his mother and colon cancer and breast cancer in his sister, and stroke in his father.  Other past history other than that outlined above includes herniorrhaphy in 1983.  He had a colonoscopy in 1999 which was negative; this was performed  by Dr. Carman Ching.  MEDICATIONS:  He is presently on no medications.  ALLERGIES:  He denies any drug allergies.  SOCIAL HISTORY:  He drinks socially.  He has had an occasional cigar, but does not smoke regularly.  REVIEW OF SYSTEMS:  As outlined above.  The remainder of the review of systems was negative.  He is monitored by Dr. Earlene Plater for his prostate cancer.  Dr. Mariah Milling monitors his alkaline phosphatase in reference to his Pagets.  PHYSICAL EXAMINATION:  GENERAL:  He appears fatigued but not acutely ill.  VITAL SIGNS:  Weight was 194 pounds, temperature 98.1, pulse 112 and irregular, respiratory rate 17, and blood pressure 90/62.  HEENT:  Ophthalmologic exam was negative, as was the otolaryngology exam. Thyroid was normal to palpation.  CHEST:  Clear to auscultation.  HEART:  He had an irregularly irregular rhythm.  All pulses were intact and there were no bruits or aneurysms suggested.  NEUROLOGIC:  Homans sign was negative.  He had no peripheral edema.  ABDOMEN:  Nontender with no organomegaly or masses.  GENITOURINARY:  Exam deferred, as they were not pertaining to this admission and are done routinely by Dr. Darvin Neighbours.  MUSCULOSKELETAL:  Unremarkable with no evidence of a significant thoracolumbar dysfunction.  SKIN:  Warm and dry.  NEUROPSYCHIATRIC:  Negative except for the evidence of some denial in reference to his present problem.  His wife had encouraged him to be seen in the emergency room October 21, but he had insisted that this be deferred and the problem watched.  LABORATORY:  EKG revealed new onset atrial fibrillation without significant ventricular dysrhythmia.  Because atrial fibrillation was associated with chest tightness, he will be admitted to telemetry with cardiac enzymes.  Cardizem drip will be initiated. Thyroid functions will be checked.  If the dysphagia persists after the initiation of proton pump inhibitor, a GI consult may  be necessary.  At this time he denies any dysphagia or chest pain, but further evaluation by cardiology or gastroenterology may be necessary. Dictated by:   Titus Dubin. Alwyn Ren, M.D. [facility] Attending Physician:  Dolores Patty DD:  09/03/01 TD:  09/03/01 Job: [id number]

## 2011-03-30 NOTE — Discharge Summary (Signed)
NAMESLYVESTER, LATONA               ACCOUNT NO.:  [id number]   MEDICAL RECORD NO.:  [id number]          PATIENT TYPE:  INP   LOCATION:  4730                         FACILITY:  [facility]   PHYSICIAN:  Madaline Savage, M.D.DATE OF BIRTH:  1941-07-28   DATE OF ADMISSION:  04/14/2006  DATE OF DISCHARGE:  04/20/2006                                 DISCHARGE SUMMARY   Mr. Gottschall is a 70 year old male who is followed by Dr. Alwyn Ren and Dr.  Shelva Majestic for Cardiology.  He has a history of lone atrial fib in 2002.  He  had an echocardiogram at that time that showed normal LV function, mild  dilated left atrium.  His exercise Cardiolite study showed no evidence of  ischemia.  He was put on Coumadin for 3 months but he had some bleeding  problems.  He asked to be taken off Coumadin, which he was, and he had had  no further problems with atrial fib.  In May of this year, he had a left  inguinal hernia repair by Dr. Daphine Deutscher on Apr 02, 2006.  He had been on  aspirin but he had been off since his surgery.  He apparently had some  dyspnea on exertion and orthopnea for 3 days prior to coming to the  emergency room.  In the emergency room he was found to be in atrial  fibrillation at a rate of 140.  He was put on IV Cardizem, seen by Dr.  Elsie Lincoln, put on IV heparin, admitted and recommended for cardiac  catheterization.  Cardiac catheterization was performed on April 15, 2006, by  Dr. Elsie Lincoln.  His coronaries were normal, his LV function was fair, EF of 45-  55%.  He was in atrial fibrillation with rapid ventricular response.  Post  cath he was put on Lovenox and Coumadin was started the day of his cath.  He  was given some digoxin and this was continued.  He was also placed on beta-  blockers.  On the early morning of April 17, 2006, he converted to sinus  rhythm.  He was seen by Dr. Elsie Lincoln on April 17, 2006.  We decided to place him  on flecainide for PAF prevention and Coumadin was continued.  He was given  the  option of going home on Lovenox to Coumadin, but because of his previous  history of bleeding problems he was hesitant to go home on self-injections.  On April 20, 2006, his INR was 2.2, which was therapeutic.  He was seen by Dr.  Nanetta Batty, considered stable for discharge home.  Also to note,  apparently on admission he had an abnormal D-dimer.  It was decided that he  should go for a CT scan of his chest.  He did that.  He did have a blastic  T10, this was thought it was because of his history of Paget's disease.   COUMADIN DOSING:  On April 15, 2006, he received 5 mg, on April 16, 2006, 7.5  mg, April 17, 2006, 10 mg, April 18, 2006, 10 mg, and April 19, 2006, 10 mg.  LABS:  On April 17, 2006, his INR was 1.2, April 18, 2006, it was 1.6, and April 19, 2006, it was 2.2.   OTHER LABS:  His hemoglobin was 13.7 and hematocrit 39.5, WBCs 5.4 and  platelets 207.  His sodium was 141, potassium 4, BUN 14, creatinine 1.1,  glucose was 101.  Albumin was 2.1 and total protein was 5.8.  AST was 9, ALP  was 38.  CK MB #1 47/1.3, troponin 0.03, #2 40/0.8, troponin 0.04, #3  40/1.1, troponin of 0.03.  TSH was 1.178, D-dimer was 1.31, BNP was 493.  On  April 14, 2006, the calcium was 8.9.  He had a chest x-ray April 14, 2006, which  showed mild interstitial edema.  Abdominal film showed no obstruction or  free air, probable bony changes or Paget's disease in the pelvis.  CT of his  chest on April 14, 2006, showed no evidence of pulmonary emboli, prominent  interstitial markings, question interstitial edema thoracic T10 vertebral  body, cannot exclude metastatic disease.   DISCHARGE MEDICATIONS:  1.  Coumadin 7.5 mg once a day between 5 and 6 p.m.  2.  Lanoxin 0.125 mg once a day.  3.  Flecainide 50 mg twice a day.   To note, the patient is hesitant about taking any higher doses of Coumadin  secondary to his bleeding problems in the past.  He is to return to see Dr.  Shelva Majestic, he will give his office a call.  He  should have his prothrombin  time checked on Monday.   DISCHARGE DIAGNOSES:  1.  Atrial fibrillation with rapid ventricular response, converted to sinus      rhythm spontaneously on Cardizem and beta-blockers.  Patient now on      flecainide to prevent further episodes, this is the second episode in 5      years.  2.  Congestive heart failure on admission with normal to low normal ejection      fraction, probably related to rapid ventricular response of atrial      fibrillation at the time of admission.  3.  History of Paget's disease.  4.  History of prostate cancer followed by Dr. Earlene Plater with a history of seed      implant in the past.  5.  Recent of inguinal hernia repair.  6.  Initial elevated liver function test decreased after patient stabilized.  7.  Status post cardiac catheterization with no coronary artery disease.      Lezlie Octave, N.P.    ______________________________  Madaline Savage, M.D.    BB/MEDQ  D:  04/20/2006  T:  04/21/2006  Job:  [id number]   cc:   Titus Dubin. Alwyn Ren, M.D. [facility]  [phone] [place]  [place] [address]   Macarthur Critchley. Shelva Majestic, M.D.  Fax: [phone]

## 2011-03-30 NOTE — Consult Note (Signed)
[name] [name], [name] [name]               ACCOUNT NO.:  [id number]   MEDICAL RECORD NO.:  [id number]          PATIENT TYPE:  EMS   LOCATION:  ED                           FACILITY:  [facility]   PHYSICIAN:  Angelia Mould. Derrell Lolling, M.D.DATE OF BIRTH:  1941-09-24   DATE OF CONSULTATION:  03/27/2006  DATE OF DISCHARGE:                                   CONSULTATION   CHIEF COMPLAINT:  Painful mass, left groin.   HISTORY OF PRESENT ILLNESS:  This is a 70 year old white man who had  bilateral inguinal hernia repairs in 1981 and had repair of a recurrent  right inguinal hernia by Dr. Wenda Low in 2003.  He has not had any  problems of the left side.  Yesterday, he did lots of heavy lifting with hay  bales.  Today at noon, he noted some pain and a bulge in his left groin, and  it has gotten progressively worse during the day.  He vomited once.  He  called Dr. Ovidio Kin this evening, and Dr. Ezzard Standing advised to come to the  [facility] Emergency Room.  When he got here, I was called to evaluate him.  He is still having severe pain when I saw him. He is also having some reflux  symptoms and feels like he needs to pass gas.  He had a normal bowel  movement this morning and has not seen any blood in stools.   PAST HISTORY:  1.  Bilateral inguinal hernia repair 1981.  He does not think that mesh was      used.  Repair recurrent right inguinal hernia with mesh by Dr. Wenda Low on September 15, 200; no problems on the right side since.  2.  Atrial fibrillation 2002, not sustained.  Followed by Dr. Harland Dingwall.  3.  Paget's disease of bone followed by Dr. Cardell Peach, rheumatologist in [facility].  4.  Right rotator cuff surgery by Dr. Worthy Rancher.  5.  Carcinoma of the prostate treated with radioactive seed implantation by      Dr. Gaynelle Arabian.   MEDICATIONS:  None.   DRUG ALLERGIES:  None known.   FAMILY HISTORY:  Father died age 7, had a stroke.  Mother died, had  congestive heart  failure, heart rhythm problems and diabetes.  Sister had  breast and colon cancer.   SOCIAL HISTORY:  The patient and his wife live in [place]. They have three  children.  He is a retired Pensions consultant for [employer].  He smokes cigars.  He has two glasses of wine per day.   REVIEW OF SYSTEMS:  Fifteen system review of system is performed and is  noncontributory except as above.   PHYSICAL EXAMINATION:  GENERAL:  Pleasant, older gentleman who appears fit  for his age.  He is in moderate distress from pain. He was very cooperative.  VITAL SIGNS:  Temperature 98.2, pulse 72, respirations 16, blood pressure  152/88.  HEENT:  EYES:  Sclera clear. Extraocular movements intact.  Ears, nose,  mouth and throat:  Nose, lips, tongue and oropharynx are without gross  lesions.  NECK:  Supple, nontender.  No mass.  LUNGS:  Clear to auscultation.  No chest wall tenderness.  HEART:  Regular rate and rhythm.  No murmur.  Radial and femoral pulses are  palpable.  ABDOMEN:  Soft, active bowel sounds, not distended, nontender.  GENITOURINARY:  The patient has a tender mass in his left groin but no  overlying skin change.  Penis, scrotum and testes are normal.  There is no  evidence of tenderness or mass on the right groin.  With some pressure, I  was able to reduce this hernia and felt it pop back in, and he immediately  felt better.  EXTREMITIES:  He moves all four extremities without pain or deformity.  NEUROLOGIC:  No gross motor sensory deficits.   LABORATORY DATA:  Hemoglobin 15.1, white blood cell count 7600.   ASSESSMENT:  Incarcerated left inguinal hernia, successfully reduced with  resolution of symptoms.  Low probability of compromised bowel.   PLAN:  1.  I had a long discussion with the patient and his patient's wife about      options for intervention including surgery tonight, admission overnight      with surgery tomorrow or discharge home with return to office  in 24-48      hours for elective scheduling of his left inguinal hernia.  2.  After observation for about 15 to 20 minutes, he became completely      asymptomatic, stated he felt well and wanted to go home, that the nausea      had resolved, and he thought he would be fine.  He was given advice      regarding symptoms and to return to emergency room if he develops any      recurrent pain or bulge.  We are going to get him up and ambulate him      before he goes home to make sure that he is doing all right.  3.  He is instructed to call our office tomorrow morning and to see either      Dr. Daphine Deutscher or me in the office either this Thursday or Friday to plan      elective surgery. He seems to understand all of these issues well, and      all of his questions were answered.      Angelia Mould. Derrell Lolling, M.D.  Electronically Signed     HMI/MEDQ  D:  03/27/2006  T:  03/28/2006  Job:  [id number]

## 2011-06-05 ENCOUNTER — Encounter: Payer: Self-pay | Admitting: Cardiovascular Disease

## 2011-08-10 ENCOUNTER — Telehealth: Payer: Self-pay | Admitting: Cardiovascular Disease

## 2011-08-10 NOTE — Telephone Encounter (Signed)
Pt would like to have some labs drawn. PSA,Alkin Fosfate along with other normal labs

## 2011-08-13 NOTE — Telephone Encounter (Signed)
Looks like we haven't drawn labs in a while, and he is on lasix too, do you want me to order bmet and lipid/lft and can send to [facility] ?

## 2011-08-15 NOTE — Telephone Encounter (Signed)
He may want to ask PMD. We do not do PSA. Maybe they would do it all at once and we can look at their labs

## 2011-08-16 ENCOUNTER — Telehealth: Payer: Self-pay

## 2011-08-16 MED ORDER — AMIODARONE HCL 200 MG PO TABS: ORAL_TABLET | ORAL | Status: DC

## 2011-08-16 NOTE — Telephone Encounter (Signed)
Refill sent for amiodarone 200 mg per Dr. Mariah Milling take 1/2 tablet daily.

## 2011-08-16 NOTE — Telephone Encounter (Signed)
Refill sent for amiodarone 200 mg take one half tablet daily.

## 2011-08-16 NOTE — Telephone Encounter (Signed)
What dose of amiodarone is the patient to be on?  See last office visit for amiodarone dose.  The patient was taken amiodarone 100 mg daily but looks like you want the patient on amiodarone 400 mg daily.  Please advise the pharmacy is calling with a refill.

## 2011-08-16 NOTE — Telephone Encounter (Signed)
Just need to send a refill for amiodarone.

## 2011-08-16 NOTE — Telephone Encounter (Signed)
Attempted to contact pt, LMOM TCB.  

## 2011-08-16 NOTE — Telephone Encounter (Signed)
Spoke to pt's wife, notified of below. She is ok with [place] giving an order for labs at his next ov 10/29 and pt can have drawn at PMD's.

## 2011-08-31 ENCOUNTER — Encounter: Payer: Self-pay | Admitting: Cardiovascular Disease

## 2011-09-10 ENCOUNTER — Ambulatory Visit: Payer: No Typology Code available for payment source | Admitting: Cardiovascular Disease

## 2011-09-20 ENCOUNTER — Encounter: Payer: Self-pay | Admitting: Cardiovascular Disease

## 2011-09-24 ENCOUNTER — Encounter: Payer: Self-pay | Admitting: Cardiovascular Disease

## 2011-09-24 ENCOUNTER — Ambulatory Visit (INDEPENDENT_AMBULATORY_CARE_PROVIDER_SITE_OTHER): Payer: Medicare Other | Admitting: Cardiovascular Disease

## 2011-09-24 DIAGNOSIS — I509 Heart failure, unspecified: Secondary | ICD-10-CM

## 2011-09-24 DIAGNOSIS — I4891 Unspecified atrial fibrillation: Secondary | ICD-10-CM

## 2011-09-24 DIAGNOSIS — E559 Vitamin D deficiency, unspecified: Secondary | ICD-10-CM

## 2011-09-24 DIAGNOSIS — M889 Osteitis deformans of unspecified bone: Secondary | ICD-10-CM

## 2011-09-24 DIAGNOSIS — E785 Hyperlipidemia, unspecified: Secondary | ICD-10-CM

## 2011-09-24 NOTE — Assessment & Plan Note (Signed)
He reports having low vitamin D. He is on supplementation. We will check this for him today.

## 2011-09-24 NOTE — Assessment & Plan Note (Signed)
He does not have a primary care physician and we will check LFTs including alkaline phosphatase.

## 2011-09-24 NOTE — Patient Instructions (Signed)
You are doing well. No medication changes were made.  We will take some blood today.  Please call us if you have new issues that need to be addressed before your next appt.  The office will contact you for a follow up Appt. In 12 months

## 2011-09-24 NOTE — Progress Notes (Signed)
Patient ID: [name] [name], male    DOB: 1941-07-13, 70 y.o.   MRN: [id number]  HPI Comments: [name] [name] is a very pleasant 70 year old gentleman, history of padgets, atrial fibrillation with cardioversion in December of 2010 who presents for routine followup.  He reports having one short episode of atrial fibrillation since his last visit. It occurred in July 2012. He took amiodarone 400 mg times one and he converted to sinus rhythm 3 hours later. He has not had any further episodes since that time and is maintained on amiodarone 100 mg daily. He is not taking metoprolol on a regular basis as he typically has bradycardia.  He otherwise feels well, exercises on a daily basis.     he had a cardiac catheterization in June 2007 that showed no significant coronary artery disease. Ejection fraction at that time was 45-55% and he was in atrial fibrillation with a rapid rate.   EKG shows normal sinus rhythm with a rate of 58 beats per minute, no significant ST or T wave changes    Outpatient Encounter Prescriptions as of 09/24/2011  Medication Sig Dispense Refill  . amiodarone (PACERONE) 200 MG tablet Take 0.5 to 1 tablet daily.  90 tablet  3  . aspirin 325 MG EC tablet Take 325 mg by mouth daily.        . Cholecalciferol (VITAMIN D3) 2000 UNITS capsule Take 2,000 Units by mouth. Take two daily       . Magnesium 400 MG CAPS Take 400 mg by mouth 1 dose over 46 hours.        . metoprolol tartrate (LOPRESSOR) 25 MG tablet Take 12.5 mg by mouth as needed.       . Multiple Vitamin (MULTIVITAMIN) tablet Take 1 tablet by mouth daily.        . Omega 3-6-9 Fatty Acids (OMEGA 3-6-9 COMPLEX) CAPS Take 1 capsule by mouth 2 (two) times daily.        . dabigatran (PRADAXA) 150 MG CAPS Take 1 capsule (150 mg total) by mouth as needed. Take when in a fib.  14 capsule  0     Review of Systems  Constitutional: Negative.   HENT: Negative.   Eyes: Negative.   Respiratory: Negative.   Cardiovascular: Negative.    Gastrointestinal: Negative.   Musculoskeletal: Negative.   Skin: Negative.   Neurological: Negative.   Hematological: Negative.   Psychiatric/Behavioral: Negative.   All other systems reviewed and are negative.    BP 138/72  Pulse 58  Ht 5\' 10"  (1.778 m)  Wt 200 lb 6.4 oz (90.901 kg)  BMI 28.75 kg/m2  Physical Exam  Nursing note and vitals reviewed. Constitutional: He is oriented to person, place, and time. He appears well-developed and well-nourished.  HENT:  Head: Normocephalic.  Nose: Nose normal.  Mouth/Throat: Oropharynx is clear and moist.  Eyes: Conjunctivae are normal. Pupils are equal, round, and reactive to light.  Neck: Normal range of motion. Neck supple. No JVD present.  Cardiovascular: Normal rate, regular rhythm, S1 normal, S2 normal, normal heart sounds and intact distal pulses.  Exam reveals no gallop and no friction rub.   No murmur heard. Pulmonary/Chest: Effort normal and breath sounds normal. No respiratory distress. He has no wheezes. He has no rales. He exhibits no tenderness.  Abdominal: Soft. Bowel sounds are normal. He exhibits no distension. There is no tenderness.  Musculoskeletal: Normal range of motion. He exhibits no edema and no tenderness.  Lymphadenopathy:    He  has no cervical adenopathy.  Neurological: He is alert and oriented to person, place, and time. Coordination normal.  Skin: Skin is warm and dry. No rash noted. No erythema.  Psychiatric: He has a normal mood and affect. His behavior is normal. Judgment and thought content normal.           Assessment and Plan

## 2011-09-24 NOTE — Assessment & Plan Note (Signed)
One episode in July otherwise has maintained normal sinus rhythm. We'll continue his medications including amiodarone 100 mg daily.Marland Kitchen

## 2011-09-25 ENCOUNTER — Encounter: Payer: Self-pay | Admitting: Cardiovascular Disease

## 2011-09-25 LAB — LIPID PANEL
HDL: 80 mg/dL (ref >39)
LDL Calculated: 128 mg/dL — ABNORMAL HIGH (ref 0–99)
VLDL Cholesterol Cal: 12 mg/dL (ref 5–40)

## 2011-09-25 LAB — HEPATIC FUNCTION PANEL
AST: 25 IU/L (ref 0–40)
Albumin: 4.5 g/dL (ref 3.5–4.8)

## 2011-09-25 LAB — VITAMIN D 25 HYDROXY (VIT D DEFICIENCY, FRACTURES): Vit D, 25-Hydroxy: 57 ng/mL (ref 30.0–100.0)

## 2011-09-28 ENCOUNTER — Telehealth: Payer: Self-pay | Admitting: Cardiovascular Disease

## 2011-09-28 NOTE — Telephone Encounter (Signed)
Pt calling about labs that he had drawn last week

## 2011-10-01 NOTE — Telephone Encounter (Signed)
Pt calling back wanting labs results. Wants call back today.

## 2011-10-02 NOTE — Telephone Encounter (Signed)
Called pt back, spoke to wife, read letter to her regarding his labs. Gave results and Dr. Windell Hummingbird rec's to start statin due to elevated lipids. She states that she and pt watch the ratio, and "are not into taking meds unless absolutely necessary."

## 2012-02-13 DIAGNOSIS — M2040 Other hammer toe(s) (acquired), unspecified foot: Secondary | ICD-10-CM | POA: Diagnosis not present

## 2012-04-16 DIAGNOSIS — M2040 Other hammer toe(s) (acquired), unspecified foot: Secondary | ICD-10-CM | POA: Diagnosis not present

## 2012-04-16 DIAGNOSIS — L97509 Non-pressure chronic ulcer of other part of unspecified foot with unspecified severity: Secondary | ICD-10-CM | POA: Diagnosis not present

## 2012-04-21 ENCOUNTER — Telehealth: Payer: Self-pay

## 2012-04-21 ENCOUNTER — Other Ambulatory Visit: Payer: Self-pay

## 2012-04-21 ENCOUNTER — Ambulatory Visit (INDEPENDENT_AMBULATORY_CARE_PROVIDER_SITE_OTHER): Payer: Medicare Other

## 2012-04-21 VITALS — BP 110/70 | HR 61 | Ht 71.0 in | Wt 194.0 lb

## 2012-04-21 DIAGNOSIS — I4891 Unspecified atrial fibrillation: Secondary | ICD-10-CM

## 2012-04-21 HISTORY — PX: HAMMER TOE SURGERY: SHX385

## 2012-04-21 NOTE — Progress Notes (Signed)
Pt having toe amputation 04/23/12. Here for pre op EKG.  Has hx intermittent atrial fib.  Today he is in NSR.  Reviewed with Dr. Mariah Milling.  Ok to hold Pradaxa if taking.  Pt says he only takes PRN atrial fib. Has only taken this 2x since January.  Dr. Mariah Milling did warn pt to tell surgeon to not give pt too many fluids during surg since this amy trigger atrial fib intraoperatively. Will also document this on form.  No complaints.  Will fax letter to surgeon.

## 2012-04-21 NOTE — Telephone Encounter (Signed)
Discussed with Dr. Mariah Milling, who suggests pt come in anyway for baseline EKG, to document rhythm pre op.  Wife informed.  Understanding verb. Will come in this am for EKG only at 1000.

## 2012-04-21 NOTE — Telephone Encounter (Signed)
Rec'd letter from [facility] requesting cardiac clearance for toe amputation scheduled for 04/23/12.  Pt is taking Pradaxa so Dr. Mariah Milling wanted pt to come in for EKG to see if he is in NSR. If in NSR, he would feel better about pt holding the Pradaxa for surg.    I called wife to tell her this.  She says pt is not taking Pradaxa. Says he only takes this "as needed when he is out of rhythm" and he has only had to take this 2 x since January. Says he stays in a predominant sinus rhythm.  Today she says he is in NSR.    I will talk with Dr. Mariah Milling to see if pt still needs EKG. If not we will go ahead and send clearance letter.  Understanding verb.

## 2012-04-22 DIAGNOSIS — Z7982 Long term (current) use of aspirin: Secondary | ICD-10-CM | POA: Diagnosis not present

## 2012-04-22 DIAGNOSIS — M2040 Other hammer toe(s) (acquired), unspecified foot: Secondary | ICD-10-CM | POA: Diagnosis not present

## 2012-04-22 DIAGNOSIS — L989 Disorder of the skin and subcutaneous tissue, unspecified: Secondary | ICD-10-CM | POA: Diagnosis not present

## 2012-04-22 DIAGNOSIS — I4891 Unspecified atrial fibrillation: Secondary | ICD-10-CM | POA: Diagnosis not present

## 2012-04-23 DIAGNOSIS — M2040 Other hammer toe(s) (acquired), unspecified foot: Secondary | ICD-10-CM | POA: Diagnosis not present

## 2012-04-23 DIAGNOSIS — L97509 Non-pressure chronic ulcer of other part of unspecified foot with unspecified severity: Secondary | ICD-10-CM | POA: Diagnosis not present

## 2012-04-23 DIAGNOSIS — Z7982 Long term (current) use of aspirin: Secondary | ICD-10-CM | POA: Diagnosis not present

## 2012-04-23 DIAGNOSIS — I4891 Unspecified atrial fibrillation: Secondary | ICD-10-CM | POA: Diagnosis not present

## 2012-04-23 DIAGNOSIS — L851 Acquired keratosis [keratoderma] palmaris et plantaris: Secondary | ICD-10-CM | POA: Diagnosis not present

## 2012-04-23 DIAGNOSIS — L989 Disorder of the skin and subcutaneous tissue, unspecified: Secondary | ICD-10-CM | POA: Diagnosis not present

## 2012-04-29 NOTE — Addendum Note (Signed)
Addended by: Antonieta Iba on: 04/29/2012 08:38 AM   Modules accepted: Level of Service

## 2012-08-04 DIAGNOSIS — Z85828 Personal history of other malignant neoplasm of skin: Secondary | ICD-10-CM | POA: Diagnosis not present

## 2012-08-04 DIAGNOSIS — D1739 Benign lipomatous neoplasm of skin and subcutaneous tissue of other sites: Secondary | ICD-10-CM | POA: Diagnosis not present

## 2012-08-28 ENCOUNTER — Ambulatory Visit (INDEPENDENT_AMBULATORY_CARE_PROVIDER_SITE_OTHER): Payer: Medicare Other | Admitting: Cardiovascular Disease

## 2012-08-28 ENCOUNTER — Encounter: Payer: Self-pay | Admitting: Cardiovascular Disease

## 2012-08-28 VITALS — BP 150/78 | HR 62 | Ht 71.0 in | Wt 196.0 lb

## 2012-08-28 DIAGNOSIS — E785 Hyperlipidemia, unspecified: Secondary | ICD-10-CM | POA: Diagnosis not present

## 2012-08-28 DIAGNOSIS — M889 Osteitis deformans of unspecified bone: Secondary | ICD-10-CM

## 2012-08-28 DIAGNOSIS — I509 Heart failure, unspecified: Secondary | ICD-10-CM | POA: Diagnosis not present

## 2012-08-28 DIAGNOSIS — I4891 Unspecified atrial fibrillation: Secondary | ICD-10-CM | POA: Diagnosis not present

## 2012-08-28 DIAGNOSIS — C61 Malignant neoplasm of prostate: Secondary | ICD-10-CM

## 2012-08-28 MED ORDER — AMIODARONE HCL 200 MG PO TABS: 200.0000 mg | ORAL_TABLET | Freq: Two times a day (BID) | ORAL | Status: DC | PRN

## 2012-08-28 MED ORDER — DABIGATRAN ETEXILATE MESYLATE 150 MG PO CAPS: 150.0000 mg | ORAL_CAPSULE | Freq: Two times a day (BID) | ORAL | Status: DC

## 2012-08-28 MED ORDER — METOPROLOL TARTRATE 25 MG PO TABS: 25.0000 mg | ORAL_TABLET | Freq: Two times a day (BID) | ORAL | Status: DC | PRN

## 2012-08-28 NOTE — Assessment & Plan Note (Signed)
3 episodes this year of atrial fibrillation lasting less than 12 hours. With each episode he takes amiodarone 400 mg with metoprolol and this seems to convert him. He also takes pradaxa when he has these episodes. He felt he had bradycardia and fatigue on amiodarone 100 mg daily and has stopped medication.

## 2012-08-28 NOTE — Assessment & Plan Note (Signed)
We'll give him the clinic number of urology in [facility] for followup.

## 2012-08-28 NOTE — Assessment & Plan Note (Signed)
Recent amputation of second toe on the left foot.

## 2012-08-28 NOTE — Assessment & Plan Note (Signed)
No clinical signs of heart failure. He appears relatively euvolemic.

## 2012-08-28 NOTE — Assessment & Plan Note (Addendum)
Cholesterol is high. We have suggested he start red yeast rice if he does not want a statin. He will call us for a lipid panel after he has been on red yeast rice for several months.

## 2012-08-28 NOTE — Patient Instructions (Addendum)
You are doing well. No medication changes were made.  Please continue amiodarone and metoprolol as needed  Try red yeast rice for cholesterol Urology : [name] [name], [facility]       Please call us if you have new issues that need to be addressed before your next appt.  Your physician wants you to follow-up in: 6 months.  You will receive a reminder letter in the mail two months in advance. If you don't receive a letter, please call our office to schedule the follow-up appointment.

## 2012-08-28 NOTE — Progress Notes (Signed)
Patient ID: [name] [name], male    DOB: 1941-11-02, 71 y.o.   MRN: [id number]  HPI Comments: [name] is a 71 year old gentleman, history of padgets, toe amputation on the left , paroxysmal atrial fibrillation with cardioversion in December of 2010, with numerous brief episodes since that time, who presents for routine followup.  He reports having 3 episodes of atrial fibrillation over the course of the past year, each episode lasting 8-9 hours. One episode 01/23/2012, one episode May 10, July 26 as well. He has stopped taking his amiodarone since 06/12/2012 as he found lethargic with a heart rate of 50 on one occasion. He has noticed some outpatient periodically lasting for up to 20 minutes at a time.  Is otherwise active with no complaints, able to exert himself and exercise without symptoms    cardiac catheterization in June 2007 that showed no significant coronary artery disease. Ejection fraction at that time was 45-55% and he was in atrial fibrillation with a rapid rate.   EKG shows normal sinus rhythm with a rate of 62 beats per minute, no significant ST or T wave changes    Outpatient Encounter Prescriptions as of 08/28/2012  Medication Sig Dispense Refill  . amiodarone (PACERONE) 200 MG tablet  currently not taking , only when necessary for atrial fibrillation   180 tablet  3  . Ascorbic Acid (VITAMIN C) 1000 MG tablet Take 1,000 mg by mouth daily.      Marland Kitchen aspirin 81 MG tablet Take 81 mg by mouth daily.      . Coenzyme Q10 (CO Q-10) 300 MG CAPS Take 300 mg by mouth daily.      . dabigatran (PRADAXA) 150 MG CAPS Take 1 capsule (150 mg total) by mouth every 12 (twelve) hours. Take when in a fib.  60 capsule  3  . Magnesium 400 MG CAPS Take 400 mg by mouth 1 day or 1 dose.       . metoprolol tartrate (LOPRESSOR) 25 MG tablet  takes when necessary for atrial fibrillation   180 tablet  3  . Multiple Vitamin (MULTIVITAMIN) tablet Take 1 tablet by mouth daily.        . Omega 3-6-9 Fatty  Acids (OMEGA 3-6-9 COMPLEX) CAPS Take 1 capsule by mouth 2 (two) times daily.        . Vitamin D, Ergocalciferol, (DRISDOL) 50000 UNITS CAPS Take 50,000 Units by mouth daily.        Review of Systems  Constitutional: Negative.   HENT: Negative.   Eyes: Negative.   Respiratory: Negative.   Cardiovascular: Positive for palpitations.  Gastrointestinal: Negative.   Musculoskeletal: Negative.   Skin: Negative.   Neurological: Negative.   Hematological: Negative.   Psychiatric/Behavioral: Negative.   All other systems reviewed and are negative.    BP 150/78  Pulse 62  Ht 5\' 11"  (1.803 m)  Wt 196 lb (88.905 kg)  BMI 27.34 kg/m2  Physical Exam  Nursing note and vitals reviewed. Constitutional: He is oriented to person, place, and time. He appears well-developed and well-nourished.  HENT:  Head: Normocephalic.  Nose: Nose normal.  Mouth/Throat: Oropharynx is clear and moist.  Eyes: Conjunctivae normal are normal. Pupils are equal, round, and reactive to light.  Neck: Normal range of motion. Neck supple. No JVD present.  Cardiovascular: Normal rate, regular rhythm, S1 normal, S2 normal, normal heart sounds and intact distal pulses.  Exam reveals no gallop and no friction rub.   No murmur heard. Pulmonary/Chest: Effort  normal and breath sounds normal. No respiratory distress. He has no wheezes. He has no rales. He exhibits no tenderness.  Abdominal: Soft. Bowel sounds are normal. He exhibits no distension. There is no tenderness.  Musculoskeletal: Normal range of motion. He exhibits no edema and no tenderness.  Lymphadenopathy:    He has no cervical adenopathy.  Neurological: He is alert and oriented to person, place, and time. Coordination normal.  Skin: Skin is warm and dry. No rash noted. No erythema.  Psychiatric: He has a normal mood and affect. His behavior is normal. Judgment and thought content normal.           Assessment and Plan

## 2012-11-12 DIAGNOSIS — K2210 Ulcer of esophagus without bleeding: Secondary | ICD-10-CM

## 2012-11-12 HISTORY — DX: Ulcer of esophagus without bleeding: K22.10

## 2013-01-22 ENCOUNTER — Telehealth: Payer: Self-pay | Admitting: *Deleted

## 2013-01-22 DIAGNOSIS — I4891 Unspecified atrial fibrillation: Secondary | ICD-10-CM

## 2013-01-22 MED ORDER — METOPROLOL TARTRATE 25 MG PO TABS: 25.0000 mg | ORAL_TABLET | Freq: Two times a day (BID) | ORAL | Status: DC | PRN

## 2013-01-22 MED ORDER — AMIODARONE HCL 200 MG PO TABS: 200.0000 mg | ORAL_TABLET | Freq: Two times a day (BID) | ORAL | Status: DC | PRN

## 2013-01-22 NOTE — Telephone Encounter (Signed)
Reviewed with Dr. Mariah Milling. No changes in therapy order. RX refills for amiodarone 200 mg as directed and metoprolol tart 25 mg as directed sent to [facility] in [place]. I have left a message for the patient on his cell number.

## 2013-01-22 NOTE — Telephone Encounter (Signed)
Pt calling c/o of Afib since 8:00 pm wants to know what he should do.

## 2013-01-22 NOTE — Telephone Encounter (Signed)
I spoke with the patient. He states that he went in a-fib about 8 pm last night with rates of 125-156 bpm. He took amiodarone 400 mg and metoprolol tart 25 mg at around 8:10 pm, he repeated amiodarone 400 mg at 11:30 pm, and then repeated amiodarone 400 mg and metoprolol tart 25 mg around 7 am this morning. He converted to sinus rhythm about 11 am. He did not take any pradaxa. He did take ASA 325 mg one tab last night and again this morning. He does state that his amio expired in 2/14 and his metoprolol expired 8/12. I advised I will review with Dr. Mariah Milling and call him back. He is agreeable.

## 2013-01-22 NOTE — Telephone Encounter (Signed)
Pt would like a call back on his cell [phone]

## 2013-01-27 ENCOUNTER — Emergency Department (HOSPITAL_COMMUNITY)
Admission: EM | Admit: 2013-01-27 | Discharge: 2013-01-27 | Disposition: A | Discharge status: Home / Self Care | Payer: Medicare Other | Attending: Emergency Medicine | Admitting: Emergency Medicine

## 2013-01-27 ENCOUNTER — Encounter (HOSPITAL_COMMUNITY): Payer: Self-pay | Admitting: *Deleted

## 2013-01-27 DIAGNOSIS — Z87891 Personal history of nicotine dependence: Secondary | ICD-10-CM | POA: Insufficient documentation

## 2013-01-27 DIAGNOSIS — Z8546 Personal history of malignant neoplasm of prostate: Secondary | ICD-10-CM | POA: Insufficient documentation

## 2013-01-27 DIAGNOSIS — Z79899 Other long term (current) drug therapy: Secondary | ICD-10-CM | POA: Insufficient documentation

## 2013-01-27 DIAGNOSIS — Z8719 Personal history of other diseases of the digestive system: Secondary | ICD-10-CM | POA: Insufficient documentation

## 2013-01-27 DIAGNOSIS — Z8739 Personal history of other diseases of the musculoskeletal system and connective tissue: Secondary | ICD-10-CM | POA: Diagnosis not present

## 2013-01-27 DIAGNOSIS — I509 Heart failure, unspecified: Secondary | ICD-10-CM | POA: Insufficient documentation

## 2013-01-27 DIAGNOSIS — R1310 Dysphagia, unspecified: Secondary | ICD-10-CM | POA: Diagnosis not present

## 2013-01-27 DIAGNOSIS — Z8679 Personal history of other diseases of the circulatory system: Secondary | ICD-10-CM | POA: Diagnosis not present

## 2013-01-27 DIAGNOSIS — Z7982 Long term (current) use of aspirin: Secondary | ICD-10-CM | POA: Diagnosis not present

## 2013-01-27 LAB — BASIC METABOLIC PANEL
Chloride: 105 mEq/L (ref 96–112)
Creatinine, Ser: 1.03 mg/dL (ref 0.50–1.35)
GFR calc Af Amer: 81 mL/min — ABNORMAL LOW (ref >90)
GFR calc non Af Amer: 70 mL/min — ABNORMAL LOW (ref >90)
Potassium: 4.4 mEq/L (ref 3.5–5.1)

## 2013-01-27 LAB — CBC WITH DIFFERENTIAL/PLATELET
Basophils Absolute: 0 10*3/uL (ref 0.0–0.1)
Basophils Relative: 1 % (ref 0–1)
Eosinophils Absolute: 0.1 10*3/uL (ref 0.0–0.7)
MCHC: 34.9 g/dL (ref 30.0–36.0)
Neutro Abs: 6 10*3/uL (ref 1.7–7.7)
Neutrophils Relative %: 79 % — ABNORMAL HIGH (ref 43–77)
RDW: 13.5 % (ref 11.5–15.5)

## 2013-01-27 LAB — PROTIME-INR: Prothrombin Time: 13.7 seconds (ref 11.6–15.2)

## 2013-01-27 LAB — APTT: aPTT: 32 seconds (ref 24–37)

## 2013-01-27 MED ORDER — SODIUM CHLORIDE 0.9 % IV SOLN
INTRAVENOUS | Status: DC
  Administered 2013-01-27: 15:00:00 via INTRAVENOUS

## 2013-01-27 NOTE — ED Notes (Signed)
Dr. Lynelle Doctor made aware of pt arrival to dept.

## 2013-01-27 NOTE — ED Notes (Addendum)
Pt reports "feeling like something is stuck in throat." reports eating a chunk of cantelope and it sticking in esophagus. Pt has hx of same. Reports after drinking water, he vomited and noted bright red blood in vomit. Pt also reports sharp pain in sternal area after vomiting blood.

## 2013-01-27 NOTE — ED Provider Notes (Signed)
History     CSN: [id number]  Arrival date & time 01/27/13  1306   First MD Initiated Contact with Patient 01/27/13 1408      Chief Complaint  Patient presents with  . Dysphagia     HPI Pt has had trouble with swallowing for years.  He usually needs to swallow liquids before he eats.  He knows that if he does not chew well, food will get stuck.  Today he was swallowing a piece of cantelope when it became lodged in his esophagus.  Pt had to  Past Medical History  Diagnosis Date  . CHF (congestive heart failure)   . Arrhythmia     Hx. of A-Fib.  . Paget's disease     H/O  . Inguinal hernia   . Prostate cancer     in remission    Past Surgical History  Procedure Laterality Date  . Cardioversion  2010    A-Fib  . Cardiac catheterization  2007    No significant CAD  . Inguinal hernia repair  2007  . Hammer toe surgery  04/21/2012    History reviewed. No pertinent family history.  History  Substance Use Topics  . Smoking status: Former Smoker -- 20 years    Types: Cigars    Quit date: 03/11/2009  . Smokeless tobacco: Not on file  . Alcohol Use: .5 - 1 oz/week    1-2 drink(s) per week      Review of Systems  All other systems reviewed and are negative.    Allergies  Review of patient's allergies indicates no known allergies.  Home Medications   Current Outpatient Rx  Name  Route  Sig  Dispense  Refill  . amiodarone (PACERONE) 200 MG tablet   Oral   Take 400 mg by mouth 3 (three) times daily as needed (for heart arrythmia).          Marland Kitchen aspirin EC 325 MG tablet   Oral   Take 325 mg by mouth daily as needed (for heart arrythmia).          Marland Kitchen aspirin EC 81 MG tablet   Oral   Take 81 mg by mouth every morning.         . cholecalciferol (VITAMIN D) 1000 UNITS tablet   Oral   Take 5,000 Units by mouth every morning.          . Coenzyme Q10 (CO Q-10) 300 MG CAPS   Oral   Take 300 mg by mouth every morning.          . fish oil-omega-3 fatty  acids 1000 MG capsule   Oral   Take 2 g by mouth every morning.         . Glucosamine HCl-MSM 1500-500 MG/30ML LIQD   Oral   Take 10 mLs by mouth every morning.          . Magnesium 400 MG CAPS   Oral   Take 400 mg by mouth every morning.          . metoprolol tartrate (LOPRESSOR) 25 MG tablet   Oral   Take 25 mg by mouth 2 (two) times daily as needed (for heart arrythmia).          . Multiple Vitamin (MULTIVITAMIN WITH MINERALS) TABS   Oral   Take 1 tablet by mouth every morning.         . vitamin C (ASCORBIC ACID) 500 MG tablet   Oral   Take  1,000 mg by mouth every morning.           BP 138/75  Pulse 85  Temp(Src) 98.5 F (36.9 C) (Oral)  Resp 18  SpO2 100%  Physical Exam  Nursing note and vitals reviewed. Constitutional: He appears well-developed and well-nourished. No distress.  HENT:  Head: Normocephalic and atraumatic.  Right Ear: External ear normal.  Left Ear: External ear normal.  Eyes: Conjunctivae are normal. Right eye exhibits no discharge. Left eye exhibits no discharge. No scleral icterus.  Neck: Neck supple. No tracheal deviation present.  Cardiovascular: Normal rate, regular rhythm and intact distal pulses.   Pulmonary/Chest: Effort normal and breath sounds normal. No stridor. No respiratory distress. He has no wheezes. He has no rales.  Abdominal: Soft. Bowel sounds are normal. He exhibits no distension. There is no tenderness. There is no rebound and no guarding.  Musculoskeletal: He exhibits no edema and no tenderness.  Neurological: He is alert. He has normal strength. No sensory deficit. Cranial nerve deficit:  no gross defecits noted. He exhibits normal muscle tone. He displays no seizure activity. Coordination normal.  Skin: Skin is warm and dry. No rash noted.  Psychiatric: He has a normal mood and affect.    ED Course  Procedures (including critical care time)  Labs Reviewed  CBC WITH DIFFERENTIAL - Abnormal; Notable for the  following:    Neutrophils Relative 79 (*)    All other components within normal limits  BASIC METABOLIC PANEL - Abnormal; Notable for the following:    GFR calc non Af Amer 70 (*)    GFR calc Af Amer 81 (*)    All other components within normal limits  PROTIME-INR  APTT   No results found.   1. Dysphagia       MDM  Pt able to swallow without difficulty.  D/w Dr. Matthias Hughs and he will assist in outpatient follow up.  Pt likely has some type of esophageal stricture.        Celene Kras, MD 01/27/13 [phone]

## 2013-01-28 ENCOUNTER — Other Ambulatory Visit: Payer: Self-pay | Admitting: Gastroenterology

## 2013-01-28 DIAGNOSIS — Z800 Family history of malignant neoplasm of digestive organs: Secondary | ICD-10-CM | POA: Diagnosis not present

## 2013-01-28 DIAGNOSIS — R1310 Dysphagia, unspecified: Secondary | ICD-10-CM | POA: Diagnosis not present

## 2013-01-28 DIAGNOSIS — R4702 Dysphasia: Secondary | ICD-10-CM

## 2013-02-04 ENCOUNTER — Ambulatory Visit
Admission: RE | Admit: 2013-02-04 | Discharge: 2013-02-04 | Disposition: A | Discharge status: Home / Self Care | Payer: Medicare Other | Source: Ambulatory Visit | Attending: Gastroenterology | Admitting: Gastroenterology

## 2013-02-04 DIAGNOSIS — K449 Diaphragmatic hernia without obstruction or gangrene: Secondary | ICD-10-CM | POA: Diagnosis not present

## 2013-02-04 DIAGNOSIS — R4702 Dysphasia: Secondary | ICD-10-CM

## 2013-02-18 ENCOUNTER — Other Ambulatory Visit: Payer: Self-pay | Admitting: Gastroenterology

## 2013-02-19 NOTE — Addendum Note (Signed)
Addended by: Samuel Germany. on: 02/19/2013 08:21 AM   Modules accepted: Orders

## 2013-02-24 ENCOUNTER — Encounter (HOSPITAL_COMMUNITY)
Admission: RE | Disposition: A | Discharge status: Home / Self Care | Payer: Self-pay | Source: Ambulatory Visit | Attending: Gastroenterology

## 2013-02-24 ENCOUNTER — Encounter (HOSPITAL_COMMUNITY): Payer: Self-pay | Admitting: *Deleted

## 2013-02-24 ENCOUNTER — Ambulatory Visit (HOSPITAL_COMMUNITY)
Admission: RE | Admit: 2013-02-24 | Discharge: 2013-02-24 | Disposition: A | Discharge status: Home / Self Care | Payer: Medicare Other | Source: Ambulatory Visit | Attending: Gastroenterology | Admitting: Gastroenterology

## 2013-02-24 ENCOUNTER — Ambulatory Visit (HOSPITAL_COMMUNITY): Payer: Medicare Other

## 2013-02-24 DIAGNOSIS — E539 Vitamin B deficiency, unspecified: Secondary | ICD-10-CM | POA: Diagnosis not present

## 2013-02-24 DIAGNOSIS — Z79899 Other long term (current) drug therapy: Secondary | ICD-10-CM | POA: Diagnosis not present

## 2013-02-24 DIAGNOSIS — I509 Heart failure, unspecified: Secondary | ICD-10-CM | POA: Insufficient documentation

## 2013-02-24 DIAGNOSIS — K209 Esophagitis, unspecified without bleeding: Secondary | ICD-10-CM | POA: Diagnosis not present

## 2013-02-24 DIAGNOSIS — K2210 Ulcer of esophagus without bleeding: Secondary | ICD-10-CM | POA: Insufficient documentation

## 2013-02-24 DIAGNOSIS — K219 Gastro-esophageal reflux disease without esophagitis: Secondary | ICD-10-CM | POA: Diagnosis not present

## 2013-02-24 DIAGNOSIS — Z7982 Long term (current) use of aspirin: Secondary | ICD-10-CM | POA: Diagnosis not present

## 2013-02-24 DIAGNOSIS — E785 Hyperlipidemia, unspecified: Secondary | ICD-10-CM | POA: Diagnosis not present

## 2013-02-24 DIAGNOSIS — K222 Esophageal obstruction: Secondary | ICD-10-CM | POA: Insufficient documentation

## 2013-02-24 DIAGNOSIS — I4891 Unspecified atrial fibrillation: Secondary | ICD-10-CM | POA: Insufficient documentation

## 2013-02-24 DIAGNOSIS — Z87891 Personal history of nicotine dependence: Secondary | ICD-10-CM | POA: Insufficient documentation

## 2013-02-24 DIAGNOSIS — K2289 Other specified disease of esophagus: Secondary | ICD-10-CM | POA: Insufficient documentation

## 2013-02-24 DIAGNOSIS — M889 Osteitis deformans of unspecified bone: Secondary | ICD-10-CM | POA: Diagnosis not present

## 2013-02-24 DIAGNOSIS — Z8546 Personal history of malignant neoplasm of prostate: Secondary | ICD-10-CM | POA: Insufficient documentation

## 2013-02-24 DIAGNOSIS — K228 Other specified diseases of esophagus: Secondary | ICD-10-CM | POA: Insufficient documentation

## 2013-02-24 HISTORY — PX: SAVORY DILATION: SHX5439

## 2013-02-24 HISTORY — PX: ESOPHAGOGASTRODUODENOSCOPY: SHX5428

## 2013-02-24 SURGERY — EGD (ESOPHAGOGASTRODUODENOSCOPY)
Anesthesia: Moderate Sedation

## 2013-02-24 MED ORDER — SODIUM CHLORIDE 0.9 % IV SOLN: INTRAVENOUS | Status: DC

## 2013-02-24 MED ORDER — DIPHENHYDRAMINE HCL 50 MG/ML IJ SOLN
INTRAMUSCULAR | Status: AC
  Filled 2013-02-24: qty 1

## 2013-02-24 MED ORDER — FENTANYL CITRATE 0.05 MG/ML IJ SOLN
INTRAMUSCULAR | Status: AC
  Filled 2013-02-24: qty 4

## 2013-02-24 MED ORDER — MIDAZOLAM HCL 10 MG/2ML IJ SOLN
INTRAMUSCULAR | Status: AC
  Filled 2013-02-24: qty 4

## 2013-02-24 MED ORDER — SODIUM CHLORIDE 0.9 % IV SOLN
INTRAVENOUS | Status: DC
  Administered 2013-02-24: 500 mL via INTRAVENOUS

## 2013-02-24 MED ORDER — BUTAMBEN-TETRACAINE-BENZOCAINE 2-2-14 % EX AERO
INHALATION_SPRAY | CUTANEOUS | Status: DC | PRN
  Administered 2013-02-24: 2 via TOPICAL

## 2013-02-24 MED ORDER — MIDAZOLAM HCL 10 MG/2ML IJ SOLN
INTRAMUSCULAR | Status: DC | PRN
  Administered 2013-02-24 (×2): 2 mg via INTRAVENOUS

## 2013-02-24 MED ORDER — FENTANYL CITRATE 0.05 MG/ML IJ SOLN
INTRAMUSCULAR | Status: DC | PRN
  Administered 2013-02-24 (×2): 25 ug via INTRAVENOUS

## 2013-02-24 NOTE — H&P (Signed)
  Subjective:   Patient is a 72 y.o. male presents withesophageal stricture.this was demonstrated on barium swallow Procedure including risks and benefits discussed in office.  Patient Active Problem List   Diagnosis Date Noted  . Hyperlipidemia 08/28/2012  . Vitamin d deficiency 09/24/2011  . ATRIAL FIBRILLATION 02/10/2010  . PROSTATE CANCER 01/25/2010  . CHF 01/25/2010  . INGUINAL HERNIAS, BILATERAL 01/25/2010  . PAGET'S DISEASE 01/25/2010   Past Medical History  Diagnosis Date  . CHF (congestive heart failure)   . Arrhythmia     Hx. of A-Fib.  . Paget's disease     H/O  . Inguinal hernia   . Prostate cancer     in remission    Past Surgical History  Procedure Laterality Date  . Cardioversion  2010    A-Fib  . Cardiac catheterization  2007    No significant CAD  . Inguinal hernia repair  2007  . Hammer toe surgery  04/21/2012    Prescriptions prior to admission  Medication Sig Dispense Refill  . cholecalciferol (VITAMIN D) 1000 UNITS tablet Take 5,000 Units by mouth every morning.       . Coenzyme Q10 (CO Q-10) 300 MG CAPS Take 300 mg by mouth every morning.       . fish oil-omega-3 fatty acids 1000 MG capsule Take 2 g by mouth every morning.      . Glucosamine HCl-MSM 1500-500 MG/30ML LIQD Take 10 mLs by mouth every morning.       . Multiple Vitamin (MULTIVITAMIN WITH MINERALS) TABS Take 1 tablet by mouth every morning.      . vitamin C (ASCORBIC ACID) 500 MG tablet Take 1,000 mg by mouth every morning.      Marland Kitchen amiodarone (PACERONE) 200 MG tablet Take 400 mg by mouth 3 (three) times daily as needed (for heart arrythmia).       Marland Kitchen aspirin EC 325 MG tablet Take 325 mg by mouth daily as needed (for heart arrythmia).       Marland Kitchen aspirin EC 81 MG tablet Take 81 mg by mouth every morning.      . Magnesium 400 MG CAPS Take 400 mg by mouth every morning.       . metoprolol tartrate (LOPRESSOR) 25 MG tablet Take 25 mg by mouth 2 (two) times daily as needed (for heart arrythmia).         No Known Allergies  History  Substance Use Topics  . Smoking status: Former Smoker -- 20 years    Types: Cigars    Quit date: 03/11/2009  . Smokeless tobacco: Not on file  . Alcohol Use: .5 - 1 oz/week    1-2 drink(s) per week    History reviewed. No pertinent family history.   Objective:   Patient Vitals for the past 8 hrs:  BP Temp Temp src Pulse Resp SpO2 Height Weight  02/24/13 0814 133/71 mmHg 98 F (36.7 C) Oral 59 20 95 % 5\' 11"  (1.803 m) 88.905 kg (196 lb)         See MD Preop evaluation      Assessment:   1. Esophageal stricture versus Schatski ring.  Plan:   EGD would Savary dilatation under flouro. Risk and benefits of the procedure discuss with the patient again.

## 2013-02-24 NOTE — Op Note (Signed)
[facility] [address] [place], [address]   ENDOSCOPY PROCEDURE REPORT  PATIENT: [name], [name]  MR#: [id number] BIRTHDATE: 1941-07-25 , 71  yrs. old GENDER: Male ENDOSCOPIST:Joyelle Siedlecki, MD REFERRED BY:  Dr. Julien Nordmann PROCEDURE DATE:  02/24/2013 PROCEDURE:    EGD with Savary dilatation ASA CLASS:  class 2 INDICATIONS:  continue dysphagia of solids with barium swallow suggesting stricture or ring at the GE junction MEDICATION:   fentanyl 50 mcg, versed 4 milligrams TOPICAL ANESTHETIC:   Cetacaine spray  DESCRIPTION OF PROCEDURE:   the Pentax upper scope was inserted blindly into the esophagus with swallowing. The entire distal esophagus was inflamed with marked erosive esophagitis with several linear ulcers just above the GE junction. The scope pass through the strictured area with gentle pressure. Complete endoscopy was performed. The duodenum include the bulb and 2nd portion was normal. The pyloric channel antrum body and fundus of the stomach were normal. The Savary guidewire was placed through the scope in the scope was withdrawn using fluoroscopic guidance to maintain position of guidewire. With the patient's neck extended weekend passed 12 mm, 12.8 mm, 14 mm, and 15 mm dilators using fluoroscopic guidance. Small amount heme was seen with the 15 mm dilator in the guidewire was withdrawn. There were no immediate complications.     COMPLICATIONS: None  ENDOSCOPIC IMPRESSION: 1. Esophageal stricture. This was dilated to 15 mm. I suspect this was a reflux stricture given this degree of esophagitis. 2. Marked erosive esophagitis. Patient has been clinically asymptomatic but clearly needs to be on PPI therapy.  RECOMMENDATIONS: 1. Routine postdilation orders. 2. Will begin daily Nexium therapy. 3. Will follow back in the office of 2 months .    _______________________________ eSigned:  Carman Ching, MD 02/24/2013 9:37 AM   CC Dr Maryland Pink

## 2013-02-25 ENCOUNTER — Encounter (HOSPITAL_COMMUNITY): Payer: Self-pay | Admitting: Gastroenterology

## 2013-04-29 DIAGNOSIS — K222 Esophageal obstruction: Secondary | ICD-10-CM | POA: Diagnosis not present

## 2013-07-08 ENCOUNTER — Emergency Department (HOSPITAL_COMMUNITY): Payer: Medicare Other

## 2013-07-08 ENCOUNTER — Emergency Department (HOSPITAL_COMMUNITY)
Admission: EM | Admit: 2013-07-08 | Discharge: 2013-07-08 | Disposition: A | Discharge status: Home / Self Care | Payer: Medicare Other | Attending: Emergency Medicine | Admitting: Emergency Medicine

## 2013-07-08 ENCOUNTER — Encounter (HOSPITAL_COMMUNITY): Payer: Self-pay | Admitting: *Deleted

## 2013-07-08 DIAGNOSIS — I499 Cardiac arrhythmia, unspecified: Secondary | ICD-10-CM | POA: Insufficient documentation

## 2013-07-08 DIAGNOSIS — Z79899 Other long term (current) drug therapy: Secondary | ICD-10-CM | POA: Insufficient documentation

## 2013-07-08 DIAGNOSIS — J029 Acute pharyngitis, unspecified: Secondary | ICD-10-CM | POA: Insufficient documentation

## 2013-07-08 DIAGNOSIS — B9789 Other viral agents as the cause of diseases classified elsewhere: Secondary | ICD-10-CM | POA: Diagnosis not present

## 2013-07-08 DIAGNOSIS — Z8719 Personal history of other diseases of the digestive system: Secondary | ICD-10-CM | POA: Diagnosis not present

## 2013-07-08 DIAGNOSIS — I509 Heart failure, unspecified: Secondary | ICD-10-CM | POA: Insufficient documentation

## 2013-07-08 DIAGNOSIS — R509 Fever, unspecified: Secondary | ICD-10-CM | POA: Diagnosis not present

## 2013-07-08 DIAGNOSIS — R5381 Other malaise: Secondary | ICD-10-CM | POA: Insufficient documentation

## 2013-07-08 DIAGNOSIS — Z8679 Personal history of other diseases of the circulatory system: Secondary | ICD-10-CM | POA: Diagnosis not present

## 2013-07-08 DIAGNOSIS — Z8546 Personal history of malignant neoplasm of prostate: Secondary | ICD-10-CM | POA: Diagnosis not present

## 2013-07-08 DIAGNOSIS — Z8739 Personal history of other diseases of the musculoskeletal system and connective tissue: Secondary | ICD-10-CM | POA: Insufficient documentation

## 2013-07-08 DIAGNOSIS — Z87891 Personal history of nicotine dependence: Secondary | ICD-10-CM | POA: Insufficient documentation

## 2013-07-08 DIAGNOSIS — R52 Pain, unspecified: Secondary | ICD-10-CM | POA: Insufficient documentation

## 2013-07-08 DIAGNOSIS — B349 Viral infection, unspecified: Secondary | ICD-10-CM

## 2013-07-08 LAB — URINALYSIS, ROUTINE W REFLEX MICROSCOPIC
Glucose, UA: NEGATIVE mg/dL
Hgb urine dipstick: NEGATIVE
Ketones, ur: NEGATIVE mg/dL
Protein, ur: NEGATIVE mg/dL
pH: 6 (ref 5.0–8.0)

## 2013-07-08 LAB — COMPREHENSIVE METABOLIC PANEL
AST: 16 U/L (ref 0–37)
Albumin: 3.2 g/dL — ABNORMAL LOW (ref 3.5–5.2)
Calcium: 9 mg/dL (ref 8.4–10.5)
Creatinine, Ser: 0.94 mg/dL (ref 0.50–1.35)
Total Protein: 6.3 g/dL (ref 6.0–8.3)

## 2013-07-08 LAB — CBC WITH DIFFERENTIAL/PLATELET
Eosinophils Absolute: 0.1 10*3/uL (ref 0.0–0.7)
Eosinophils Relative: 2 % (ref 0–5)
Hemoglobin: 13.7 g/dL (ref 13.0–17.0)
Lymphocytes Relative: 12 % (ref 12–46)
Lymphs Abs: 0.8 10*3/uL (ref 0.7–4.0)
MCH: 34.9 pg — ABNORMAL HIGH (ref 26.0–34.0)
MCV: 94.1 fL (ref 78.0–100.0)
Monocytes Relative: 18 % — ABNORMAL HIGH (ref 3–12)
RBC: 3.93 MIL/uL — ABNORMAL LOW (ref 4.22–5.81)

## 2013-07-08 LAB — RAPID STREP SCREEN (MED CTR MEBANE ONLY): Streptococcus, Group A Screen (Direct): NEGATIVE

## 2013-07-08 MED ORDER — ACETAMINOPHEN 325 MG PO TABS
650.0000 mg | ORAL_TABLET | Freq: Once | ORAL | Status: AC
  Administered 2013-07-08: 650 mg via ORAL
  Filled 2013-07-08: qty 2

## 2013-07-08 NOTE — ED Provider Notes (Signed)
CSN: [id number]     Arrival date & time 07/08/13  0135 History   First MD Initiated Contact with Patient 07/08/13 0153     Chief Complaint  Patient presents with  . Generalized Body Aches  . Fever   (Consider location/radiation/quality/duration/timing/severity/associated sxs/prior Treatment) HPI Comments: Patient with past medical history significant for atrial fibrillation presents with complaints of fevers and chills off and on for the past several days. He states that his throat feels a little branching but denies any other specific complaints. He denies cough, abdominal pain, urinary complaints, vomiting or diarrhea. He denies ill contacts  Patient is a 72 y.o. male presenting with fever. The history is provided by the patient.  Fever Max temp prior to arrival:  102 Temp source:  Oral Severity:  Moderate Onset quality:  Sudden Duration:  3 days Timing:  Intermittent Progression:  Worsening Chronicity:  New Relieved by:  Nothing Worsened by:  Nothing tried Ineffective treatments:  None tried Associated symptoms: chills and sore throat   Associated symptoms: no chest pain and no congestion     Past Medical History  Diagnosis Date  . CHF (congestive heart failure)   . Arrhythmia     Hx. of A-Fib.  . Paget's disease     H/O  . Inguinal hernia   . Prostate cancer     in remission   Past Surgical History  Procedure Laterality Date  . Cardioversion  2010    A-Fib  . Cardiac catheterization  2007    No significant CAD  . Inguinal hernia repair  2007  . Hammer toe surgery  04/21/2012  . Esophagogastroduodenoscopy N/A 02/24/2013    Procedure: ESOPHAGOGASTRODUODENOSCOPY (EGD);  Surgeon: Vertell Novak., MD;  Location: [facility];  Service: Endoscopy;  Laterality: N/A;  . Savory dilation N/A 02/24/2013    Procedure: SAVORY DILATION;  Surgeon: Vertell Novak., MD;  Location: [facility];  Service: Endoscopy;  Laterality: N/A;   No family history on file. History   Substance Use Topics  . Smoking status: Former Smoker -- 20 years    Types: Cigars    Quit date: 03/11/2009  . Smokeless tobacco: Not on file  . Alcohol Use: .5 - 1 oz/week    1-2 drink(s) per week    Review of Systems  Constitutional: Positive for fever and chills.  HENT: Positive for sore throat. Negative for congestion.   Cardiovascular: Negative for chest pain.  All other systems reviewed and are negative.    Allergies  Review of patient's allergies indicates no known allergies.  Home Medications   Current Outpatient Rx  Name  Route  Sig  Dispense  Refill  . amiodarone (PACERONE) 200 MG tablet   Oral   Take 400 mg by mouth 3 (three) times daily as needed (for heart arrythmia).          Marland Kitchen aspirin EC 325 MG tablet   Oral   Take 325 mg by mouth daily as needed (for heart arrythmia).          Marland Kitchen aspirin EC 81 MG tablet   Oral   Take 81 mg by mouth every morning.         . cholecalciferol (VITAMIN D) 1000 UNITS tablet   Oral   Take 5,000 Units by mouth every morning.          . Coenzyme Q10 (CO Q-10) 300 MG CAPS   Oral   Take 300 mg by mouth every morning.          Marland Kitchen  fish oil-omega-3 fatty acids 1000 MG capsule   Oral   Take 2 g by mouth every morning.         . Glucosamine HCl-MSM 1500-500 MG/30ML LIQD   Oral   Take 10 mLs by mouth every morning.          . Magnesium 400 MG CAPS   Oral   Take 400 mg by mouth every morning.          . metoprolol tartrate (LOPRESSOR) 25 MG tablet   Oral   Take 25 mg by mouth 2 (two) times daily as needed (for heart arrythmia).          . Multiple Vitamin (MULTIVITAMIN WITH MINERALS) TABS   Oral   Take 1 tablet by mouth every morning.         . vitamin C (ASCORBIC ACID) 500 MG tablet   Oral   Take 1,000 mg by mouth every morning.          BP 134/62  Pulse 84  Temp(Src) 101.3 F (38.5 C) (Oral)  Resp 18  Ht 5\' 11"  (1.803 m)  Wt 193 lb (87.544 kg)  BMI 26.93 kg/m2  SpO2 93% Physical Exam   Nursing note and vitals reviewed. Constitutional: He is oriented to person, place, and time. He appears well-developed and well-nourished. No distress.  HENT:  Head: Normocephalic and atraumatic.  Oropharynx is mildly erythematous without exudates.  Neck: Normal range of motion. Neck supple.  Cardiovascular: Normal rate.   No murmur heard. Pulmonary/Chest: Effort normal and breath sounds normal. No respiratory distress.  Abdominal: Soft. Bowel sounds are normal. He exhibits no distension. There is no tenderness.  Musculoskeletal: Normal range of motion. He exhibits no edema.  Lymphadenopathy:    He has no cervical adenopathy.  Neurological: He is alert and oriented to person, place, and time.  Skin: Skin is warm and dry. He is not diaphoretic.    ED Course  Procedures (including critical care time) Labs Review Labs Reviewed  RAPID STREP SCREEN  COMPREHENSIVE METABOLIC PANEL  CBC WITH DIFFERENTIAL  URINALYSIS, ROUTINE W REFLEX MICROSCOPIC   Imaging Review No results found.  patient presents with complaints of chill MDM  No diagnosis found. Patient presents with complaints of fever, chills, and generalized malaise over the past 2 or 3 days. He has no specific complaints with the exception of a scratchy throat. Workup reveals no elevation of white count, clear urine, negative strep test, and chest x-ray suggestive of a possible viral process. I doubt this is a bacterial pneumonia. He will be discharged to home with continued Tylenol and Motrin. Should he worsen he is to return to the emergency department.     Geoffery Lyons, MD 07/08/13 [id number]

## 2013-07-08 NOTE — ED Notes (Signed)
Pt states since Saturday started having body aches, yesterday started feeling better, then woke up late last night w/ fever, sore throat and worsening of body aches.

## 2013-07-10 LAB — CULTURE, GROUP A STREP

## 2013-08-03 DIAGNOSIS — Z85828 Personal history of other malignant neoplasm of skin: Secondary | ICD-10-CM | POA: Diagnosis not present

## 2013-08-03 DIAGNOSIS — L570 Actinic keratosis: Secondary | ICD-10-CM | POA: Diagnosis not present

## 2013-08-03 DIAGNOSIS — L821 Other seborrheic keratosis: Secondary | ICD-10-CM | POA: Diagnosis not present

## 2013-09-01 ENCOUNTER — Encounter: Payer: Self-pay | Admitting: Family Medicine

## 2013-09-01 ENCOUNTER — Ambulatory Visit (INDEPENDENT_AMBULATORY_CARE_PROVIDER_SITE_OTHER): Payer: Medicare Other | Admitting: Family Medicine

## 2013-09-01 VITALS — BP 144/82 | HR 64 | Temp 98.3°F | Ht 71.0 in | Wt 197.5 lb

## 2013-09-01 DIAGNOSIS — C61 Malignant neoplasm of prostate: Secondary | ICD-10-CM

## 2013-09-01 DIAGNOSIS — I4891 Unspecified atrial fibrillation: Secondary | ICD-10-CM

## 2013-09-01 DIAGNOSIS — M889 Osteitis deformans of unspecified bone: Secondary | ICD-10-CM | POA: Diagnosis not present

## 2013-09-01 DIAGNOSIS — E785 Hyperlipidemia, unspecified: Secondary | ICD-10-CM

## 2013-09-01 NOTE — Progress Notes (Signed)
Subjective:    Patient ID: [name] [name], male    DOB: 1941/02/16, 72 y.o.   MRN: [id number]  HPI CC: new pt to establish  H/o paroxysmal afib - takes cocktail as below when he has afib.  H/o afib induced CHF but pt thinks this is stable.  Normally not on meds. Afib cocktail: amidarone 400mg  bid, metoprolol 25mg  tid, calm plus, pradaxa bid, and lasix 20mg  daily. Last saw Dr. Mariah Milling 1 year ago - due for follow up.  H/o basal cell cancers - followed yearly by dermatologist - completing 5FU course on face  Preventative: No recent CPE Colonoscopy 2004 Tetanus 2011  Married, retired, gets regular exercise. 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  Past histories reviewed and updated if relevant as below. Patient Active Problem List   Diagnosis Date Noted  . Hyperlipidemia 08/28/2012  . Vitamin d deficiency 09/24/2011  . ATRIAL FIBRILLATION 02/10/2010  . PROSTATE CANCER 01/25/2010  . CHF 01/25/2010  . INGUINAL HERNIAS, BILATERAL 01/25/2010  . PAGET'S DISEASE 01/25/2010   Past Medical History  Diagnosis Date  . CHF (congestive heart failure)     history of this  . Paroxysmal atrial fibrillation   . Paget's disease     Dr. Cardell Peach [facility])  . Prostate cancer 1998    s/p seed implants Earlene Plater) in remission  . Basal cell carcinoma of cheek   . Arthritis    Past Surgical History  Procedure Laterality Date  . Cardioversion  2010    A-Fib  . Cardiac catheterization  2007    No significant CAD  . Inguinal hernia repair  2007  . Hammer toe surgery  04/21/2012    amputation for paget's  . Esophagogastroduodenoscopy N/A 02/24/2013    Procedure: ESOPHAGOGASTRODUODENOSCOPY (EGD);  Surgeon: Vertell Novak., MD;  Location: [facility];  Service: Endoscopy  . Savory dilation N/A 02/24/2013     Procedure: SAVORY DILATION;  Surgeon: Vertell Novak., MD;  Location: [facility];  Service: Endoscopy;  Laterality: N/A;  . Shoulder surgery Right 2004    RTC  . Radioactive seed implant  1998    Prostate cancer Earlene Plater)   History  Substance Use Topics  . Smoking status: Former Smoker -- 20 years    Types: Cigars    Quit date: 03/11/2009  . Smokeless tobacco: Never Used  . Alcohol Use: 0.5 oz/week    1 drink(s) per week     Comment: Occasional   Family History  Problem Relation Age of Onset  . Cancer Brother     prostate  . Arthritis Mother   . Stroke Father   . Arrhythmia Mother   . Diabetes Mother   . CAD Neg Hx    No Known Allergies Current Outpatient Prescriptions on File Prior to Visit  Medication Sig Dispense Refill  . aspirin EC 81 MG tablet Take 81 mg by mouth every morning.      . cholecalciferol (VITAMIN D) 1000 UNITS tablet Take 5,000 Units by mouth every morning.       . fish oil-omega-3 fatty acids 1000 MG capsule Take 2 g by mouth every morning.      Marland Kitchen ibuprofen (ADVIL,MOTRIN) 200 MG tablet Take 600 mg by mouth every 6 (six) hours as needed for pain.      . Magnesium 400 MG  CAPS Take 400 mg by mouth every morning.       . Multiple Vitamin (MULTIVITAMIN WITH MINERALS) TABS Take 1 tablet by mouth every morning.      . vitamin C (ASCORBIC ACID) 500 MG tablet Take 1,000 mg by mouth every morning.      Marland Kitchen amiodarone (PACERONE) 200 MG tablet Take 400 mg by mouth 2 (two) times daily as needed (for heart arrythmia).       Marland Kitchen aspirin EC 325 MG tablet Take 325 mg by mouth daily as needed (for heart arrythmia).       . Glucosamine HCl-MSM 1500-500 MG/30ML LIQD Take 10 mLs by mouth every morning.       . metoprolol tartrate (LOPRESSOR) 25 MG tablet Take 25 mg by mouth 3 (three) times daily as needed (for heart arrythmia).        No current facility-administered medications on file prior to visit.     Review of Systems  Constitutional: Negative for fever, chills,  activity change, appetite change, fatigue and unexpected weight change.  HENT: Negative for hearing loss.   Eyes: Negative for visual disturbance.  Respiratory: Negative for cough, chest tightness, shortness of breath and wheezing.   Cardiovascular: Positive for palpitations (last afib flare 06/2013). Negative for chest pain and leg swelling.  Gastrointestinal: Negative for nausea, vomiting, abdominal pain, diarrhea, constipation, blood in stool and abdominal distention.  Genitourinary: Negative for hematuria and difficulty urinating.  Musculoskeletal: Negative for arthralgias, myalgias and neck pain.  Skin: Negative for rash.  Neurological: Negative for dizziness, seizures, syncope and headaches.  Hematological: Negative for adenopathy. Does not bruise/bleed easily.  Psychiatric/Behavioral: Negative for dysphoric mood. The patient is not nervous/anxious.        Objective:   Physical Exam  Nursing note and vitals reviewed. Constitutional: He is oriented to person, place, and time. He appears well-developed and well-nourished. No distress.  HENT:  Head: Normocephalic and atraumatic.  Right Ear: Hearing, tympanic membrane, external ear and ear canal normal.  Left Ear: Hearing, tympanic membrane, external ear and ear canal normal.  Nose: Nose normal.  Mouth/Throat: Oropharynx is clear and moist. No oropharyngeal exudate.  Eyes: Conjunctivae and EOM are normal. Pupils are equal, round, and reactive to light. No scleral icterus.  Neck: Normal range of motion. Neck supple. Carotid bruit is not present. No thyromegaly present.  Cardiovascular: Normal rate, regular rhythm, normal heart sounds and intact distal pulses.   No murmur heard. Pulses:      Radial pulses are 2+ on the right side, and 2+ on the left side.  Pulmonary/Chest: Effort normal and breath sounds normal. No respiratory distress. He has no wheezes. He has no rales.  Musculoskeletal: Normal range of motion. He exhibits no edema.   Lymphadenopathy:    He has no cervical adenopathy.  Neurological: He is alert and oriented to person, place, and time.  CN grossly intact, station and gait intact  Skin: Skin is warm and dry. No rash noted. There is erythema.  Healing skin irritation bilateral cheeks  Psychiatric: He has a normal mood and affect. His behavior is normal. Judgment and thought content normal.       Assessment & Plan:

## 2013-09-01 NOTE — Patient Instructions (Signed)
Check to see if you're due to see Dr. Mariah Milling. Return in 2-3 months for fasting blood work and afterwards for [employer] visit. Call us with questions.  Good to meet you today.

## 2013-09-02 NOTE — Assessment & Plan Note (Signed)
Will need yearly PSA. 

## 2013-09-02 NOTE — Assessment & Plan Note (Signed)
Off cholesterol medicine. Will need to check FLP when he returns for wellness exam.

## 2013-09-02 NOTE — Assessment & Plan Note (Signed)
intermittent episodes, 2-3 per year.  Has Afib cocktail he uses per cards with each episode that helps control. Prior intolerance to daily amiodarone. I asked him to call cardiology to schedule f/u.

## 2013-09-02 NOTE — Assessment & Plan Note (Signed)
Followed by Dr. Cardell Peach in [facility]. Will need yearly ALP.

## 2013-09-22 ENCOUNTER — Telehealth: Payer: Self-pay | Admitting: *Deleted

## 2013-09-22 NOTE — Telephone Encounter (Signed)
Left message for pt to call and make appt to see Dr. Mariah Milling, as he has not been seen in over a year.

## 2013-09-22 NOTE — Telephone Encounter (Signed)
Patient needs a letter to his insurance company stating that his medication Amiodarone 200mg  and metoprolol 25mg  are only to be taken for emergency medication when heart is in afib. [employer] fax #[phone]  Attn: Waynetta Sandy

## 2013-09-25 ENCOUNTER — Ambulatory Visit (INDEPENDENT_AMBULATORY_CARE_PROVIDER_SITE_OTHER): Payer: Medicare Other | Admitting: Cardiovascular Disease

## 2013-09-25 ENCOUNTER — Encounter: Payer: Self-pay | Admitting: Cardiovascular Disease

## 2013-09-25 VITALS — BP 130/72 | HR 68 | Ht 71.0 in | Wt 195.5 lb

## 2013-09-25 DIAGNOSIS — E785 Hyperlipidemia, unspecified: Secondary | ICD-10-CM

## 2013-09-25 DIAGNOSIS — I4891 Unspecified atrial fibrillation: Secondary | ICD-10-CM

## 2013-09-25 DIAGNOSIS — I509 Heart failure, unspecified: Secondary | ICD-10-CM | POA: Diagnosis not present

## 2013-09-25 NOTE — Assessment & Plan Note (Signed)
He scheduled to have his cholesterol checked in January 2015. Suggested he retry red yeast rice if greater than 200. He does not want a statin. Encouraged him to increase his fiber and oatmeal. Other options include zetia or binding agents

## 2013-09-25 NOTE — Progress Notes (Signed)
Patient ID: [name] [name], male    DOB: Feb 26, 1941, 72 y.o.   MRN: [id number]  HPI Comments: [name] [name] is a 72 year old gentleman, history of padgets, toe amputation on the left , hyperlipidemia , paroxysmal atrial fibrillation with cardioversion in December of 2010, with rare brief episodes of atrial fibrillation since that time, who presents for routine followup.  Since his last clinic visit in 2013, he has had 2 short episodes of atrial fibrillation in 01/21/2013 lasting 15 hours and 06/26/2015 lasting less than 24 hours. In August, he had had his esophagus stretched and he feels that this attributed to his recurrent arrhythmia.  He is unable to tolerate amiodarone on a regular basis secondary to bradycardia with heart rates in the high 40s, low 50s. He had lethargy associated with his bradycardia .Unable to tolerate metoprolol and regular basis.   In general he feels well with no complaints. Some stress at home as his daughter has cancer    cardiac catheterization in June 2007 that showed no significant coronary artery disease. Ejection fraction at that time was 45-55% and he was in atrial fibrillation with a rapid rate.   EKG shows normal sinus rhythm with a rate of 68 beats per minute, no significant ST or T wave changes    Outpatient Encounter Prescriptions as of 09/25/2013  Medication Sig  . amiodarone (PACERONE) 200 MG tablet Take 400 mg by mouth 2 (two) times daily as needed (for heart arrythmia).   Marland Kitchen aspirin EC 325 MG tablet Take 325 mg by mouth daily as needed (for heart arrythmia).   Marland Kitchen aspirin EC 81 MG tablet Take 81 mg by mouth daily.   . cholecalciferol (VITAMIN D) 1000 UNITS tablet Take 5,000 Units by mouth every morning.   . dabigatran (PRADAXA) 75 MG CAPS capsule Take 75 mg by mouth 2 (two) times daily as needed.  . fish oil-omega-3 fatty acids 1000 MG capsule Take 2 g by mouth every morning.  . furosemide (LASIX) 20 MG tablet Take 20 mg by mouth daily. Take on second  day of A-fib  . Glucosamine HCl-MSM 1500-500 MG/30ML LIQD Take 10 mLs by mouth every morning.   . Magnesium 400 MG CAPS Take 400 mg by mouth every morning.   . metoprolol tartrate (LOPRESSOR) 25 MG tablet Take 25 mg by mouth 3 (three) times daily as needed (for heart arrythmia).   . Multiple Vitamin (MULTIVITAMIN WITH MINERALS) TABS Take 1 tablet by mouth every morning.  . NON FORMULARY 2 scoop 2 (two) times daily as needed (when in A-fib). Calm Plus (fast magnesium intake) 2 rounded teaspoons in 8 ounces of water twice daily  . NON FORMULARY Take 450 mg by mouth daily. Tumeric  . vitamin C (ASCORBIC ACID) 500 MG tablet Take 1,000 mg by mouth every morning.     Review of Systems  Constitutional: Negative.   HENT: Negative.   Eyes: Negative.   Respiratory: Negative.   Gastrointestinal: Negative.   Endocrine: Negative.   Musculoskeletal: Negative.   Skin: Negative.   Allergic/Immunologic: Negative.   Neurological: Negative.   Hematological: Negative.   Psychiatric/Behavioral: Negative.   All other systems reviewed and are negative.    BP 130/72  Pulse 68  Ht 5\' 11"  (1.803 m)  Wt 195 lb 8 oz (88.678 kg)  BMI 27.28 kg/m2  Physical Exam  Nursing note and vitals reviewed. Constitutional: He is oriented to person, place, and time. He appears well-developed and well-nourished.  HENT:  Head: Normocephalic.  Nose:  Nose normal.  Mouth/Throat: Oropharynx is clear and moist.  Eyes: Conjunctivae are normal. Pupils are equal, round, and reactive to light.  Neck: Normal range of motion. Neck supple. No JVD present.  Cardiovascular: Normal rate, regular rhythm, S1 normal, S2 normal, normal heart sounds and intact distal pulses.  Exam reveals no gallop and no friction rub.   No murmur heard. Pulmonary/Chest: Effort normal and breath sounds normal. No respiratory distress. He has no wheezes. He has no rales. He exhibits no tenderness.  Abdominal: Soft. Bowel sounds are normal. He exhibits  no distension. There is no tenderness.  Musculoskeletal: Normal range of motion. He exhibits no edema and no tenderness.  Lymphadenopathy:    He has no cervical adenopathy.  Neurological: He is alert and oriented to person, place, and time. Coordination normal.  Skin: Skin is warm and dry. No rash noted. No erythema.  Psychiatric: He has a normal mood and affect. His behavior is normal. Judgment and thought content normal.      Assessment and Plan

## 2013-09-25 NOTE — Assessment & Plan Note (Addendum)
Rare episodes. Treated with medical management as needed for each episode. He has found that taking amiodarone with metoprolol will convert him back to normal sinus rhythm. He is symptomatic when in atrial fibrillation, checks his heart rate frequently. Letter was dictated for him today for his insurance company.

## 2013-09-25 NOTE — Patient Instructions (Addendum)
You are doing well. No medication changes were made.  Please call us if you have new issues that need to be addressed before your next appt.  Your physician wants you to follow-up in: 12 months.  You will receive a reminder letter in the mail two months in advance. If you don't receive a letter, please call our office to schedule the follow-up appointment. 

## 2013-11-16 ENCOUNTER — Other Ambulatory Visit (INDEPENDENT_AMBULATORY_CARE_PROVIDER_SITE_OTHER): Payer: Medicare Other

## 2013-11-16 DIAGNOSIS — C61 Malignant neoplasm of prostate: Secondary | ICD-10-CM | POA: Diagnosis not present

## 2013-11-16 DIAGNOSIS — M889 Osteitis deformans of unspecified bone: Secondary | ICD-10-CM | POA: Diagnosis not present

## 2013-11-16 DIAGNOSIS — E785 Hyperlipidemia, unspecified: Secondary | ICD-10-CM

## 2013-11-16 DIAGNOSIS — I509 Heart failure, unspecified: Secondary | ICD-10-CM

## 2013-11-16 DIAGNOSIS — I4891 Unspecified atrial fibrillation: Secondary | ICD-10-CM | POA: Diagnosis not present

## 2013-11-16 LAB — COMPREHENSIVE METABOLIC PANEL
ALT: 20 U/L (ref 0–53)
AST: 24 U/L (ref 0–37)
Albumin: 4.2 g/dL (ref 3.5–5.2)
Alkaline Phosphatase: 93 U/L (ref 39–117)
BILIRUBIN TOTAL: 1 mg/dL (ref 0.3–1.2)
BUN: 16 mg/dL (ref 6–23)
CHLORIDE: 106 meq/L (ref 96–112)
CO2: 29 mEq/L (ref 19–32)
CREATININE: 1 mg/dL (ref 0.4–1.5)
Calcium: 9.3 mg/dL (ref 8.4–10.5)
GFR: 81.34 mL/min (ref >60.00)
Glucose, Bld: 90 mg/dL (ref 70–99)
Potassium: 4.3 mEq/L (ref 3.5–5.1)
Sodium: 141 mEq/L (ref 135–145)
Total Protein: 6.8 g/dL (ref 6.0–8.3)

## 2013-11-16 LAB — CBC WITH DIFFERENTIAL/PLATELET
BASOS PCT: 0.6 % (ref 0.0–3.0)
Basophils Absolute: 0 10*3/uL (ref 0.0–0.1)
EOS PCT: 2.8 % (ref 0.0–5.0)
Eosinophils Absolute: 0.1 10*3/uL (ref 0.0–0.7)
HEMATOCRIT: 44.6 % (ref 39.0–52.0)
HEMOGLOBIN: 15.2 g/dL (ref 13.0–17.0)
LYMPHS ABS: 1.5 10*3/uL (ref 0.7–4.0)
Lymphocytes Relative: 28.8 % (ref 12.0–46.0)
MCHC: 34 g/dL (ref 30.0–36.0)
MCV: 96.4 fl (ref 78.0–100.0)
MONO ABS: 0.5 10*3/uL (ref 0.1–1.0)
Monocytes Relative: 10.1 % (ref 3.0–12.0)
Neutro Abs: 2.9 10*3/uL (ref 1.4–7.7)
Neutrophils Relative %: 57.7 % (ref 43.0–77.0)
Platelets: 190 10*3/uL (ref 150.0–400.0)
RBC: 4.63 Mil/uL (ref 4.22–5.81)
RDW: 14.1 % (ref 11.5–14.6)
WBC: 5.1 10*3/uL (ref 4.5–10.5)

## 2013-11-16 LAB — LIPID PANEL
CHOL/HDL RATIO: 3
CHOLESTEROL: 211 mg/dL — AB (ref 0–200)
HDL: 63.9 mg/dL (ref >39.00)
TRIGLYCERIDES: 62 mg/dL (ref 0.0–149.0)
VLDL: 12.4 mg/dL (ref 0.0–40.0)

## 2013-11-16 LAB — TSH: TSH: 3.47 u[IU]/mL (ref 0.35–5.50)

## 2013-11-16 LAB — PSA: PSA: 0.08 ng/mL — ABNORMAL LOW (ref 0.10–4.00)

## 2013-11-16 LAB — LDL CHOLESTEROL, DIRECT: Direct LDL: 132.9 mg/dL

## 2013-11-19 ENCOUNTER — Ambulatory Visit (INDEPENDENT_AMBULATORY_CARE_PROVIDER_SITE_OTHER): Payer: Medicare Other | Admitting: Family Medicine

## 2013-11-19 ENCOUNTER — Encounter: Payer: Self-pay | Admitting: Family Medicine

## 2013-11-19 VITALS — BP 148/90 | HR 64 | Temp 97.6°F | Ht 71.0 in | Wt 195.8 lb

## 2013-11-19 DIAGNOSIS — I4891 Unspecified atrial fibrillation: Secondary | ICD-10-CM

## 2013-11-19 DIAGNOSIS — Z23 Encounter for immunization: Secondary | ICD-10-CM

## 2013-11-19 DIAGNOSIS — Z1211 Encounter for screening for malignant neoplasm of colon: Secondary | ICD-10-CM

## 2013-11-19 DIAGNOSIS — C61 Malignant neoplasm of prostate: Secondary | ICD-10-CM

## 2013-11-19 DIAGNOSIS — E785 Hyperlipidemia, unspecified: Secondary | ICD-10-CM

## 2013-11-19 DIAGNOSIS — Z0000 Encounter for general adult medical examination without abnormal findings: Secondary | ICD-10-CM

## 2013-11-19 NOTE — Patient Instructions (Addendum)
Pass by lab for stool kit today Bring me copy of advanced directives to update your chart. Pneumonia shot today. Good to see you today, call us with questions. Return as needed or in 1 year for next wellness exam.

## 2013-11-19 NOTE — Assessment & Plan Note (Signed)
Rare episodes, last 01/2013 per patient.  Off all meds.  Followed by cards.

## 2013-11-19 NOTE — Progress Notes (Signed)
Pre-visit discussion using our clinic review tool. No additional management support is needed unless otherwise documented below in the visit note.  

## 2013-11-19 NOTE — Assessment & Plan Note (Signed)
Discussed looking into RYR 600mg  daily to BID.  Desires to avoid statin.

## 2013-11-19 NOTE — Assessment & Plan Note (Signed)
I have personally reviewed the Medicare Annual Wellness questionnaire and have noted 1. The patient's medical and social history 2. Their use of alcohol, tobacco or illicit drugs 3. Their current medications and supplements 4. The patient's functional ability including ADL's, fall risks, home safety risks and hearing or visual impairment. 5. Diet and physical activity 6. Evidence for depression or mood disorders The patients weight, height, BMI have been recorded in the chart.  Hearing and vision has been addressed. I have made referrals, counseling and provided education to the patient based review of the above and I have provided the pt with a written personalized care plan for preventive services. See scanned questionairre. Advanced directives discussed: pt will bring me copy of his advanced directive.  Reviewed preventative protocols and updated unless pt declined. Declines most immunizations. pneuvmoax today. iFOB today.

## 2013-11-19 NOTE — Assessment & Plan Note (Signed)
Dre/psa reassuring today.

## 2013-11-19 NOTE — Progress Notes (Signed)
Subjective:    Patient ID: [name] [name], male    DOB: December 10, 1940, 73 y.o.   MRN: [id number]  HPI CC: medicare wellness  Hearing and vision screens passed today No falls in last year. No depression/anhedonia, sadness.  Preventative:  Colonoscopy 2004 [name]).  Hesitant to repeat 2/2 friend who died from infection after colonoscopy. Would like iFOB Prostate cancer - s/p seeding 1998, does get PSA (Dr. [name]) last saw 3-5 years ago. Flu - declines Td 2011 Pneumovax - would like today. zostavax - declines Advanced directives: has at home.  Will bring me copy.  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  Past histories reviewed and updated if relevant as below. Patient Active Problem List   Diagnosis Date Noted  . Hyperlipidemia 08/28/2012  . Vitamin d deficiency 09/24/2011  . Atrial fibrillation 02/10/2010  . PROSTATE CANCER 01/25/2010  . CHF 01/25/2010  . PAGET'S DISEASE 01/25/2010   Past Medical History  Diagnosis Date  . CHF (congestive heart failure)     history of this  . Paroxysmal atrial fibrillation   . Paget's disease     Dr. [name] [facility])  . Prostate cancer 1998    s/p seed implants [name]) in remission  . Basal cell carcinoma of cheek   . Arthritis    Past Surgical History  Procedure Laterality Date  . Cardioversion  2010    A-Fib  . Cardiac catheterization  2007    No significant CAD  . Inguinal hernia repair  2007  . Hammer toe surgery  04/21/2012    amputation for paget's  . Esophagogastroduodenoscopy N/A 02/24/2013    Procedure: ESOPHAGOGASTRODUODENOSCOPY (EGD);  Surgeon: [name], MD;  Location: [facility];  Service: Endoscopy  .  Savory dilation N/A 02/24/2013    Procedure: SAVORY DILATION;  Surgeon: [name], MD;  Location: [facility];  Service: Endoscopy;  Laterality: N/A;  . Shoulder surgery Right 2004    RTC  . Radioactive seed implant  1998    Prostate cancer [name])  . Basal cell carcinoma excision     History  Substance Use Topics  . Smoking status: Former Smoker -- 20 years    Types: Cigars    Quit date: 03/11/2009  . Smokeless tobacco: Never Used  . Alcohol Use: 0.5 oz/week    1 drink(s) per week     Comment: Occasional   Family History  Problem Relation Age of Onset  . Cancer Brother     prostate  . Arthritis Mother   . Stroke Father   . Arrhythmia Mother   . Diabetes Mother   . CAD Neg Hx    No Known Allergies Current Outpatient Prescriptions on File Prior to Visit  Medication Sig Dispense Refill  . aspirin EC 325 MG tablet Take 325 mg by mouth daily as needed (for heart arrythmia).       . cholecalciferol (VITAMIN D) 1000 UNITS tablet Take 5,000 Units by mouth every morning.       . fish oil-omega-3 fatty acids 1000 MG capsule Take 2 g by mouth every morning.      . Glucosamine HCl-MSM 1500-500 MG/30ML LIQD Take 10 mLs by mouth every morning.       Marland Kitchen  Magnesium 400 MG CAPS Take 400 mg by mouth every morning.       . Multiple Vitamin (MULTIVITAMIN WITH MINERALS) TABS Take 1 tablet by mouth every morning.      . NON FORMULARY 2 scoop 2 (two) times daily as needed (when in A-fib). Calm Plus (fast magnesium intake) 2 rounded teaspoons in 8 ounces of water twice daily      . NON FORMULARY Take 450 mg by mouth daily. Tumeric      . vitamin C (ASCORBIC ACID) 500 MG tablet Take 1,000 mg by mouth every morning.      Marland Kitchen amiodarone (PACERONE) 200 MG tablet Take 400 mg by mouth 2 (two) times daily as needed (for heart arrythmia).       Marland Kitchen aspirin EC 81 MG tablet Take 81 mg by mouth daily.       . dabigatran (PRADAXA) 75 MG CAPS capsule Take 75 mg by mouth 2 (two) times daily as needed.        . furosemide (LASIX) 20 MG tablet Take 20 mg by mouth daily. Take on second day of A-fib      . metoprolol tartrate (LOPRESSOR) 25 MG tablet Take 25 mg by mouth 3 (three) times daily as needed (for heart arrythmia).        No current facility-administered medications on file prior to visit.     Review of Systems  Constitutional: Negative for fever, chills, activity change, appetite change, fatigue and unexpected weight change.  HENT: Negative for hearing loss.   Eyes: Negative for visual disturbance.  Respiratory: Negative for cough, chest tightness, shortness of breath and wheezing.   Cardiovascular: Negative for chest pain, palpitations and leg swelling.  Gastrointestinal: Negative for nausea, vomiting, abdominal pain, diarrhea, constipation, blood in stool and abdominal distention.  Genitourinary: Negative for hematuria and difficulty urinating.  Musculoskeletal: Negative for arthralgias, myalgias and neck pain.  Skin: Negative for rash.  Neurological: Negative for dizziness, seizures, syncope and headaches.  Hematological: Negative for adenopathy. Does not bruise/bleed easily.  Psychiatric/Behavioral: Negative for dysphoric mood. The patient is not nervous/anxious.        Objective:   Physical Exam  Nursing note and vitals reviewed. Constitutional: He is oriented to person, place, and time. He appears well-developed and well-nourished. No distress.  HENT:  Head: Normocephalic and atraumatic.  Right Ear: Hearing, tympanic membrane, external ear and ear canal normal.  Left Ear: Hearing, tympanic membrane, external ear and ear canal normal.  Nose: Nose normal.  Mouth/Throat: Oropharynx is clear and moist. No oropharyngeal exudate.  Eyes: Conjunctivae and EOM are normal. Pupils are equal, round, and reactive to light. No scleral icterus.  Neck: Normal range of motion. Neck supple. Carotid bruit is not present. No thyromegaly present.  Cardiovascular: Normal rate, regular rhythm,  normal heart sounds and intact distal pulses.   No murmur heard. Pulses:      Radial pulses are 2+ on the right side, and 2+ on the left side.  Pulmonary/Chest: Effort normal and breath sounds normal. No respiratory distress. He has no wheezes. He has no rales.  Abdominal: Soft. Bowel sounds are normal. He exhibits no distension and no mass. There is no tenderness. There is no rebound and no guarding.  Genitourinary: Rectum normal and prostate normal. Rectal exam shows no external hemorrhoid, no internal hemorrhoid, no fissure, no mass, no tenderness and anal tone normal. Prostate is not enlarged and not tender.  Musculoskeletal: Normal range of motion. He exhibits no edema.  Lymphadenopathy:  He has no cervical adenopathy.  Neurological: He is alert and oriented to person, place, and time.  CN grossly intact, station and gait intact  Skin: Skin is warm and dry. No rash noted.  Psychiatric: He has a normal mood and affect. His behavior is normal. Judgment and thought content normal.       Assessment & Plan:

## 2013-11-19 NOTE — Addendum Note (Signed)
Addended by: Royann Shivers A on: 11/19/2013 01:11 PM   Modules accepted: Orders

## 2013-11-19 NOTE — Assessment & Plan Note (Signed)
Continue 5000 IU daily.  

## 2013-11-23 DIAGNOSIS — M889 Osteitis deformans of unspecified bone: Secondary | ICD-10-CM | POA: Diagnosis not present

## 2014-02-03 IMAGING — RF DG ESOPHAGUS
14 of 24 series · 14 of 24 positions shown · non-contrast
Comparison: 05/26/2009

CLINICAL DATA: Dysphagia.

ESOPHAGUS/BARIUM SWALLOW/TABLET STUDY
TECHNIQUE: Double contrast esophagram employed.
Fluoroscopy time:  2.4 minutes

[Series 1: run · 1 of 10 slices shown (1 of 14)]
[im 1/10]
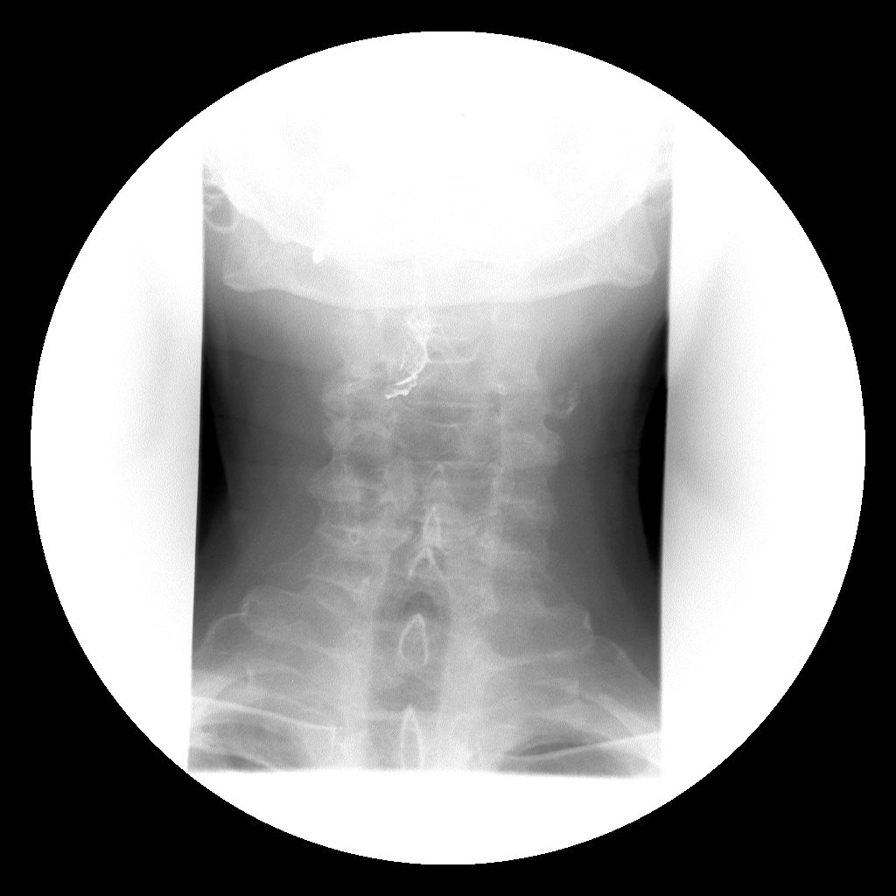

[Series 3: run · 1 of 1 slices shown (2 of 14)]
[im 1/1]
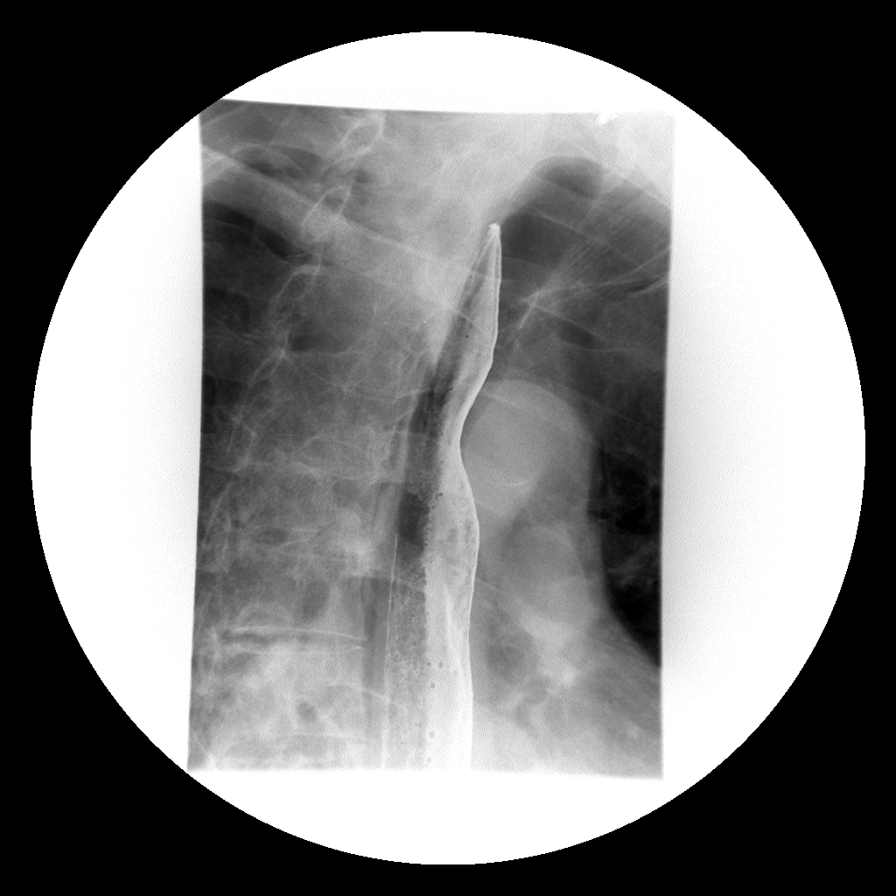

[Series 5: run · 1 of 1 slices shown (3 of 14)]
[im 1/1]
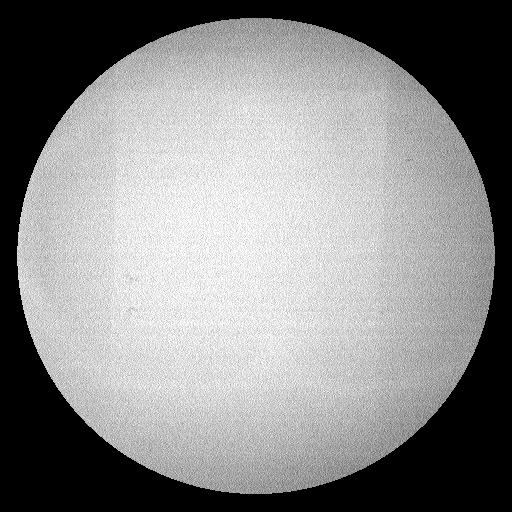

[Series 7: run · 1 of 1 slices shown (4 of 14)]
[im 1/1]
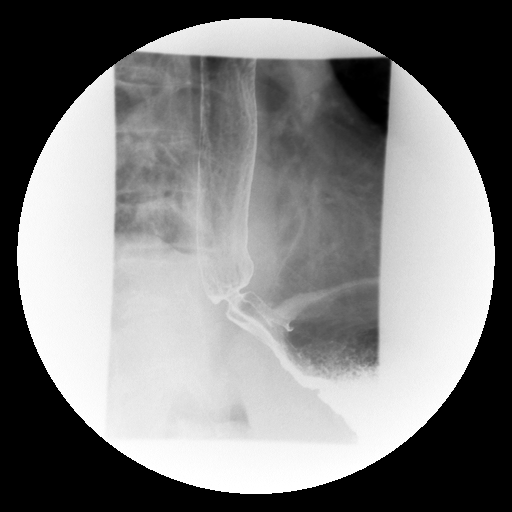

[Series 8: run · 1 of 1 slices shown (5 of 14)]
[im 1/1]
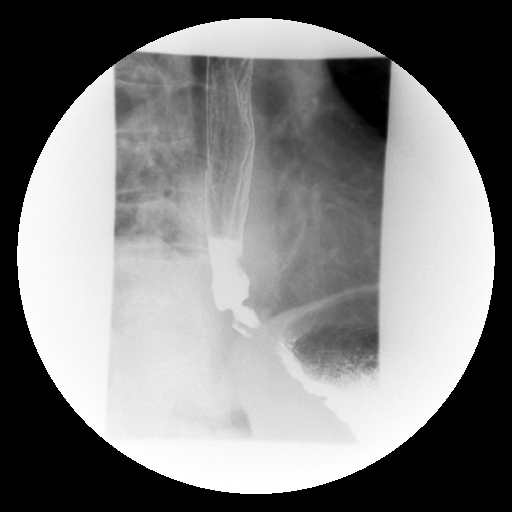

[Series 10: run · 1 of 1 slices shown (6 of 14)]
[im 1/1]
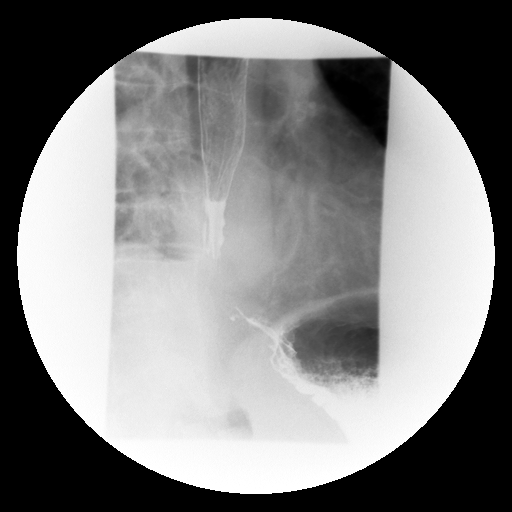

[Series 12: run · 1 of 1 slices shown (7 of 14)]
[im 1/1]
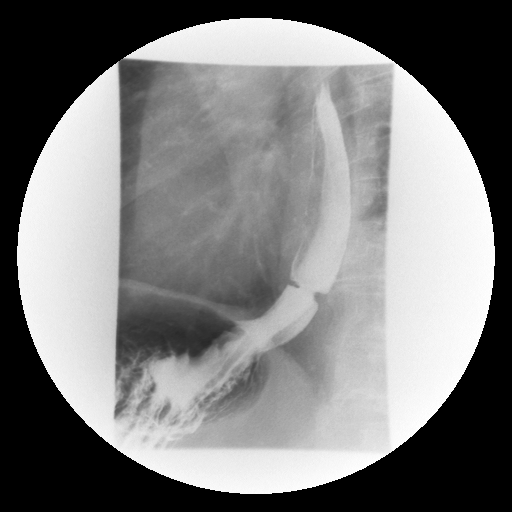

[Series 13: run · 1 of 1 slices shown (8 of 14)]
[im 1/1]
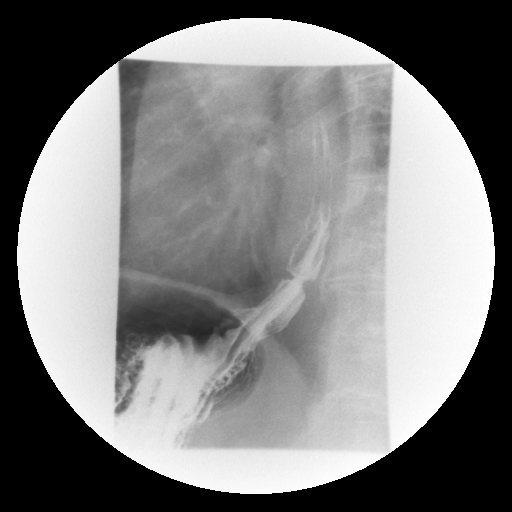

[Series 15: run · 1 of 1 slices shown (9 of 14)]
[im 1/1]
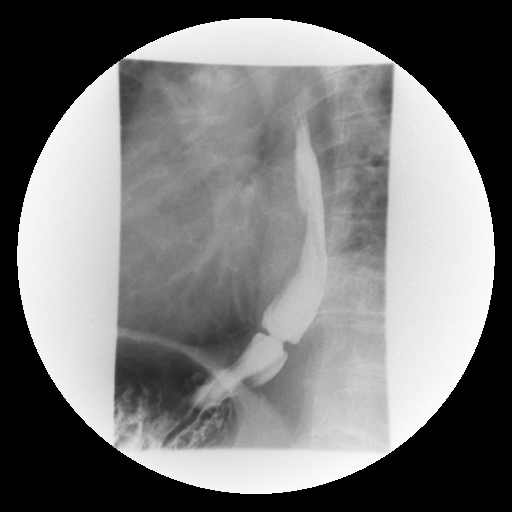

[Series 17: run · 1 of 1 slices shown (10 of 14)]
[im 1/1]
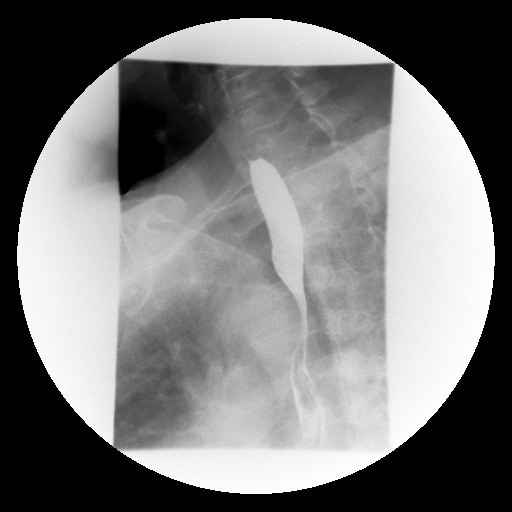

[Series 19: run · 1 of 1 slices shown (11 of 14)]
[im 1/1]
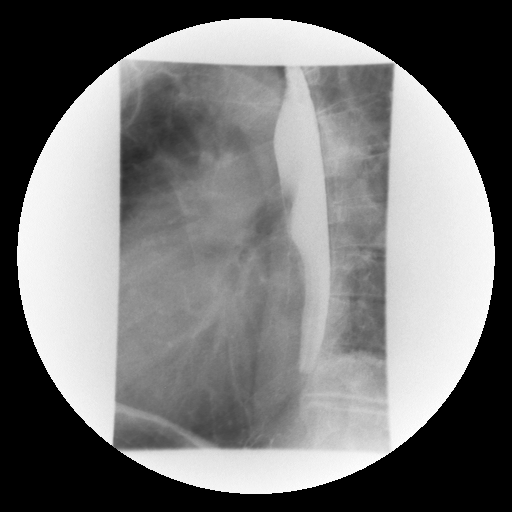

[Series 20: run · 1 of 1 slices shown (12 of 14)]
[im 1/1]
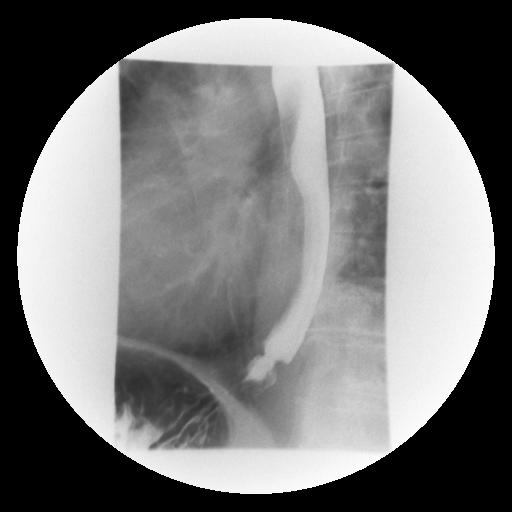

[Series 22: run · 1 of 1 slices shown (13 of 14)]
[im 1/1]
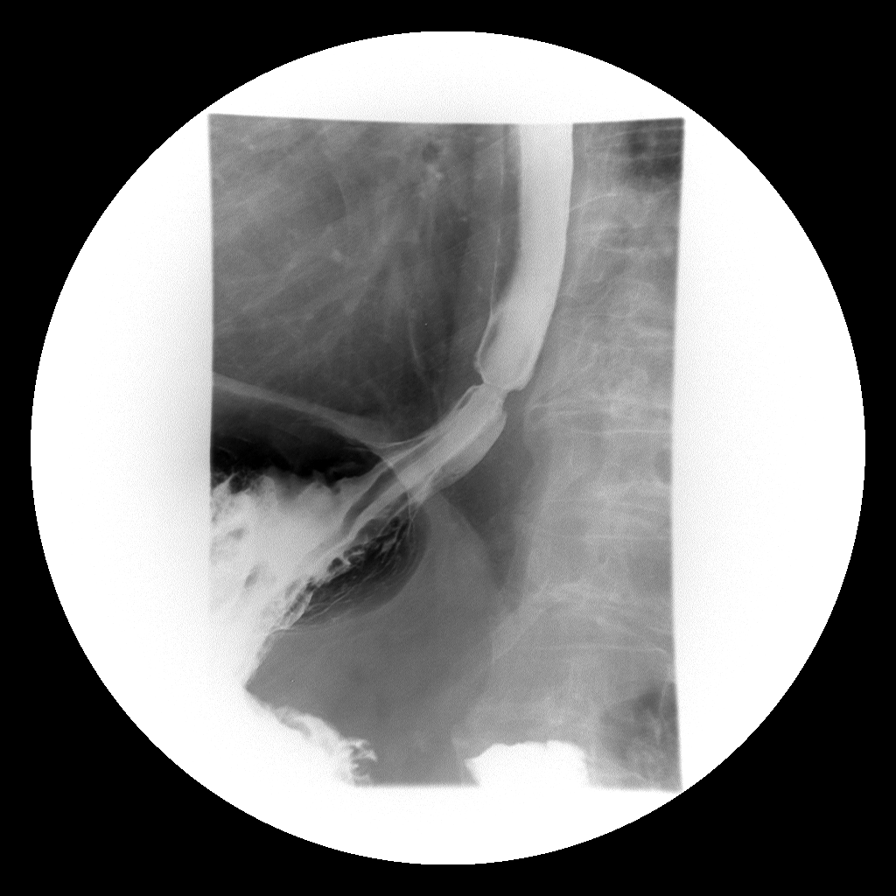

[Series 24: run · 1 of 1 slices shown (14 of 14)]
[im 1/1]
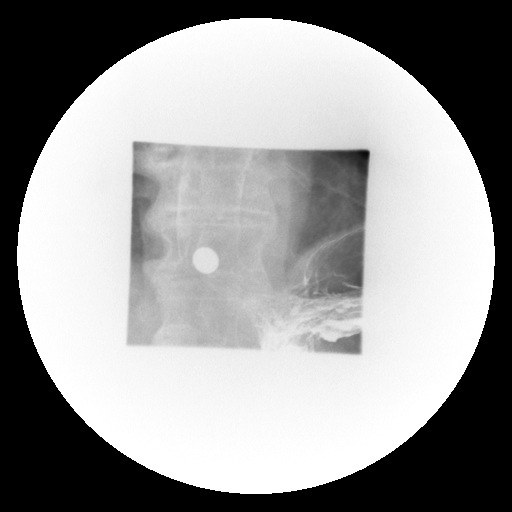

[14 of 24 positions shown; findings below may reference images not displayed]

FINDINGS: Pharyngeal phase of swallowing normal.  Degenerative loss
of intervertebral disc height at C5-6.

Primary peristaltic waves in the esophagus were preserved.

Small hiatal hernia noted along with a 9 mm in diameter Schatzki
ring (distal esophageal mucosal ring).  Predictably, a 13 mm barium
tablet impacted at this ring.

No definite distal esophageal ulceration or other stricturing
identified.
IMPRESSION: 1.  9 mm in diameter distal esophageal mucosal ring (Schatzki
ring).  This is likely to cause dysphagia.
2.  Small type 1 hiatal hernia.

## 2014-03-29 DIAGNOSIS — M2040 Other hammer toe(s) (acquired), unspecified foot: Secondary | ICD-10-CM | POA: Diagnosis not present

## 2014-03-29 DIAGNOSIS — M79609 Pain in unspecified limb: Secondary | ICD-10-CM | POA: Diagnosis not present

## 2014-06-11 ENCOUNTER — Telehealth: Payer: Self-pay

## 2014-06-11 ENCOUNTER — Encounter: Payer: Self-pay | Admitting: Cardiovascular Disease

## 2014-06-11 ENCOUNTER — Ambulatory Visit (INDEPENDENT_AMBULATORY_CARE_PROVIDER_SITE_OTHER): Payer: Medicare Other

## 2014-06-11 DIAGNOSIS — I4891 Unspecified atrial fibrillation: Secondary | ICD-10-CM | POA: Diagnosis not present

## 2014-06-11 MED ORDER — METOPROLOL TARTRATE 12.5 MG HALF TABLET: 12.5000 mg | ORAL_TABLET | Freq: Two times a day (BID) | ORAL | Status: DC

## 2014-06-11 NOTE — Progress Notes (Signed)
1.) Reason for visit: [name] [name] reports being in afib since Wednesday with a HR up to 186.                2.) Name of MD requesting visit: Dr. Rockey Situ  3.) H&P:  Per Dr. Donivan Scull last note, " He is unable to tolerate amiodarone on a regular basis secondary to bradycardia with heart rates in the high 40s, low 50s. He had lethargy associated with his bradycardia .Unable to tolerate metoprolol and regular basis."  4.) ROS related to problem: Reports that he has amiodarone 400mg  and metoprolol 25mg  to be taken in case of emergency, but they have expired. States that he has been taking these since Wed, but his HR is in the 120s this am. EKG shows HR 53, [name] [name] is no longer symptomatic at visit.   [name] [name] is hesitant to start amiodarone long term, as his brother in law developed lung damage and he is concerned about side effects. [name] [name] previously tried amiodarone 100mg  daily, but this also dropped his HR into the 40s.     [name] [name] reports h/o radiation for prostate cancer and subsequent thinning of the skin in his rectal area.  [name] [name] has pradaxa to take when he is in afib, but reports blood in his stool when he takes this.  Describes the amount of blood as "small, and  makes the water in the bowl pink."  [name] [name] asks for samples of this, as he is out of this med.    5.) Assessment and plan per MD:  Dr. Rockey Situ reviewed EKG and [name] [name] chart and recommends that [name] [name] try amiodarone 100mg  daily or metoprolol 12.5mg  BID. [name] [name] prefers to try metoprolol 12.5mg , as he has several friends that take this med w/ no adverse side effects.  He asks that 12.5mg  pills be sent in as, as he has difficulty cutting pills in 1/2.  Spoke w/ his pharmacy to see if they can cut these for him, but they state they cannot do this, but will have [name] [name] purchase a pill cutter to ensure he is getting the correct dosage.  Advised [name] [name] to sched f/u visit w/ Dr. Rockey Situ next week.  [name] [name] would like to wait until the end of August, as he they are going on vacation next week and he would like to  see if the metoprolol helps w/ symptoms.

## 2014-06-11 NOTE — Telephone Encounter (Signed)
Pt states he has been in Afib since Wednesday

## 2014-06-11 NOTE — Telephone Encounter (Signed)
Spoke w/ pt.  He reports that he has been in afib since Wed, w/ HR up to 186 at that time.  He has been taking amiodarone 400mg  and metoprolol 25mg  since that time w/ no relief.  Reports HR today is in 120s.  Pt to come in for nurse visit for EKG as soon as he can get here.

## 2014-06-11 NOTE — Patient Instructions (Signed)
Please schedule appt w/ Dr. Rockey Situ in 2 weeks.

## 2014-07-05 ENCOUNTER — Encounter: Payer: Self-pay | Admitting: Cardiovascular Disease

## 2014-07-05 ENCOUNTER — Ambulatory Visit: Payer: Medicare Other | Admitting: Cardiovascular Disease

## 2014-07-05 ENCOUNTER — Ambulatory Visit (INDEPENDENT_AMBULATORY_CARE_PROVIDER_SITE_OTHER): Payer: Medicare Other | Admitting: Cardiovascular Disease

## 2014-07-05 VITALS — BP 140/72 | HR 58 | Ht 71.0 in | Wt 201.2 lb

## 2014-07-05 DIAGNOSIS — C61 Malignant neoplasm of prostate: Secondary | ICD-10-CM

## 2014-07-05 DIAGNOSIS — M889 Osteitis deformans of unspecified bone: Secondary | ICD-10-CM | POA: Diagnosis not present

## 2014-07-05 DIAGNOSIS — I4891 Unspecified atrial fibrillation: Secondary | ICD-10-CM

## 2014-07-05 DIAGNOSIS — E785 Hyperlipidemia, unspecified: Secondary | ICD-10-CM | POA: Diagnosis not present

## 2014-07-05 NOTE — Assessment & Plan Note (Signed)
Long discussion today about his cholesterol which is more than 200. He prefers no medication at this time and we'll work on his diet and exercise. We did discuss doing a calcium score/CT scan of the heart without contrast. He will consider this

## 2014-07-05 NOTE — Assessment & Plan Note (Addendum)
Rare episodes of atrial fibrillation. We discussed the various ways to manage his atrial fibrillation. He will continue to take amiodarone and extra metoprolol as needed to restore normal sinus rhythm. As symptoms are very rare, he is not on chronic anticoagulation

## 2014-07-05 NOTE — Assessment & Plan Note (Signed)
Recent alkaline phosphatase is within normal range

## 2014-07-05 NOTE — Assessment & Plan Note (Signed)
Recent PSA level is very low

## 2014-07-05 NOTE — Progress Notes (Signed)
Patient ID: [name] [name], male    DOB: 1941/02/09, 73 y.o.   MRN: [id number]  HPI Comments: [name] [name] is a 73 year old gentleman, history of padgets, toe amputation on the left , hyperlipidemia , paroxysmal atrial fibrillation with cardioversion in December of 2010, with rare brief episodes of atrial fibrillation since that time, who presents for routine followup. Previous stress at home, daughter had cancer  In followup today, he reports having recent episode of atrial fibrillation 06/11/2014. This lasted less than one day. He does take amiodarone as needed to restore normal sinus rhythm.  Since the event at the end of July, he is taking metoprolol 12.5 mg twice a day He also takes extra metoprolol for breakthrough arrhythmia  Previous episodes of atrial fibrillation in 01/21/2013 lasting 15 hours and 06/26/2015 lasting less than 24 hours.  In August 2014, he had had his esophagus stretched and he feels that this attributed to his recurrent arrhythmia.  He is unable to tolerate amiodarone on a regular basis secondary to bradycardia with heart rates in the high 40s, low 50s.  In general he feels well with no complaints.    cardiac catheterization in June 2007 that showed no significant coronary artery disease. Ejection fraction at that time was 45-55% and he was in atrial fibrillation with a rapid rate.   EKG shows normal sinus rhythm with a rate of 58 beats per minute, no significant ST or T wave changes    Outpatient Encounter Prescriptions as of 07/05/2014  Medication Sig  . aspirin EC 81 MG tablet Take 81 mg by mouth daily.   . cholecalciferol (VITAMIN D) 1000 UNITS tablet Take 5,000 Units by mouth every morning.   . fish oil-omega-3 fatty acids 1000 MG capsule Take 2 g by mouth every morning.  . Glucosamine HCl-MSM 1500-500 MG/30ML LIQD Take 10 mLs by mouth every morning.   . Magnesium 400 MG CAPS Take 400 mg by mouth every morning.   . metoprolol tartrate (LOPRESSOR) 12.5 mg  TABS tablet Take 0.5 tablets (12.5 mg total) by mouth 2 (two) times daily.  . Multiple Vitamin (MULTIVITAMIN WITH MINERALS) TABS Take 1 tablet by mouth every morning.  . NON FORMULARY 2 scoop 2 (two) times daily as needed (when in A-fib). Calm Plus (fast magnesium intake) 2 rounded teaspoons in 8 ounces of water twice daily  . NON FORMULARY Take 450 mg by mouth daily. Tumeric  . vitamin C (ASCORBIC ACID) 500 MG tablet Take 1,000 mg by mouth every morning.    Review of Systems  Constitutional: Negative.   HENT: Negative.   Eyes: Negative.   Respiratory: Negative.   Cardiovascular: Negative.   Gastrointestinal: Negative.   Endocrine: Negative.   Musculoskeletal: Negative.   Skin: Negative.   Allergic/Immunologic: Negative.   Neurological: Negative.   Hematological: Negative.   Psychiatric/Behavioral: Negative.   All other systems reviewed and are negative.   BP 140/72  Pulse 58  Ht 5\' 11"  (1.803 m)  Wt 201 lb 4 oz (91.286 kg)  BMI 28.08 kg/m2  Physical Exam  Nursing note and vitals reviewed. Constitutional: He is oriented to person, place, and time. He appears well-developed and well-nourished.  HENT:  Head: Normocephalic.  Nose: Nose normal.  Mouth/Throat: Oropharynx is clear and moist.  Eyes: Conjunctivae are normal. Pupils are equal, round, and reactive to light.  Neck: Normal range of motion. Neck supple. No JVD present.  Cardiovascular: Normal rate, regular rhythm, S1 normal, S2 normal, normal heart sounds and intact distal  pulses.  Exam reveals no gallop and no friction rub.   No murmur heard. Pulmonary/Chest: Effort normal and breath sounds normal. No respiratory distress. He has no wheezes. He has no rales. He exhibits no tenderness.  Abdominal: Soft. Bowel sounds are normal. He exhibits no distension. There is no tenderness.  Musculoskeletal: Normal range of motion. He exhibits no edema and no tenderness.  Lymphadenopathy:    He has no cervical adenopathy.   Neurological: He is alert and oriented to person, place, and time. Coordination normal.  Skin: Skin is warm and dry. No rash noted. No erythema.  Psychiatric: He has a normal mood and affect. His behavior is normal. Judgment and thought content normal.      Assessment and Plan

## 2014-07-05 NOTE — Patient Instructions (Signed)
You are doing well. No medication changes were made.  Please call us if you have new issues that need to be addressed before your next appt.  Your physician wants you to follow-up in: 12 months.  You will receive a reminder letter in the mail two months in advance. If you don't receive a letter, please call our office to schedule the follow-up appointment. 

## 2014-07-07 IMAGING — CR DG CHEST 2V
2 series · 2 of 2 positions shown · non-contrast
Comparison: 09/25/2009

CLINICAL DATA: Body aches, fever.

CHEST - 2 VIEW

[w chest pa]
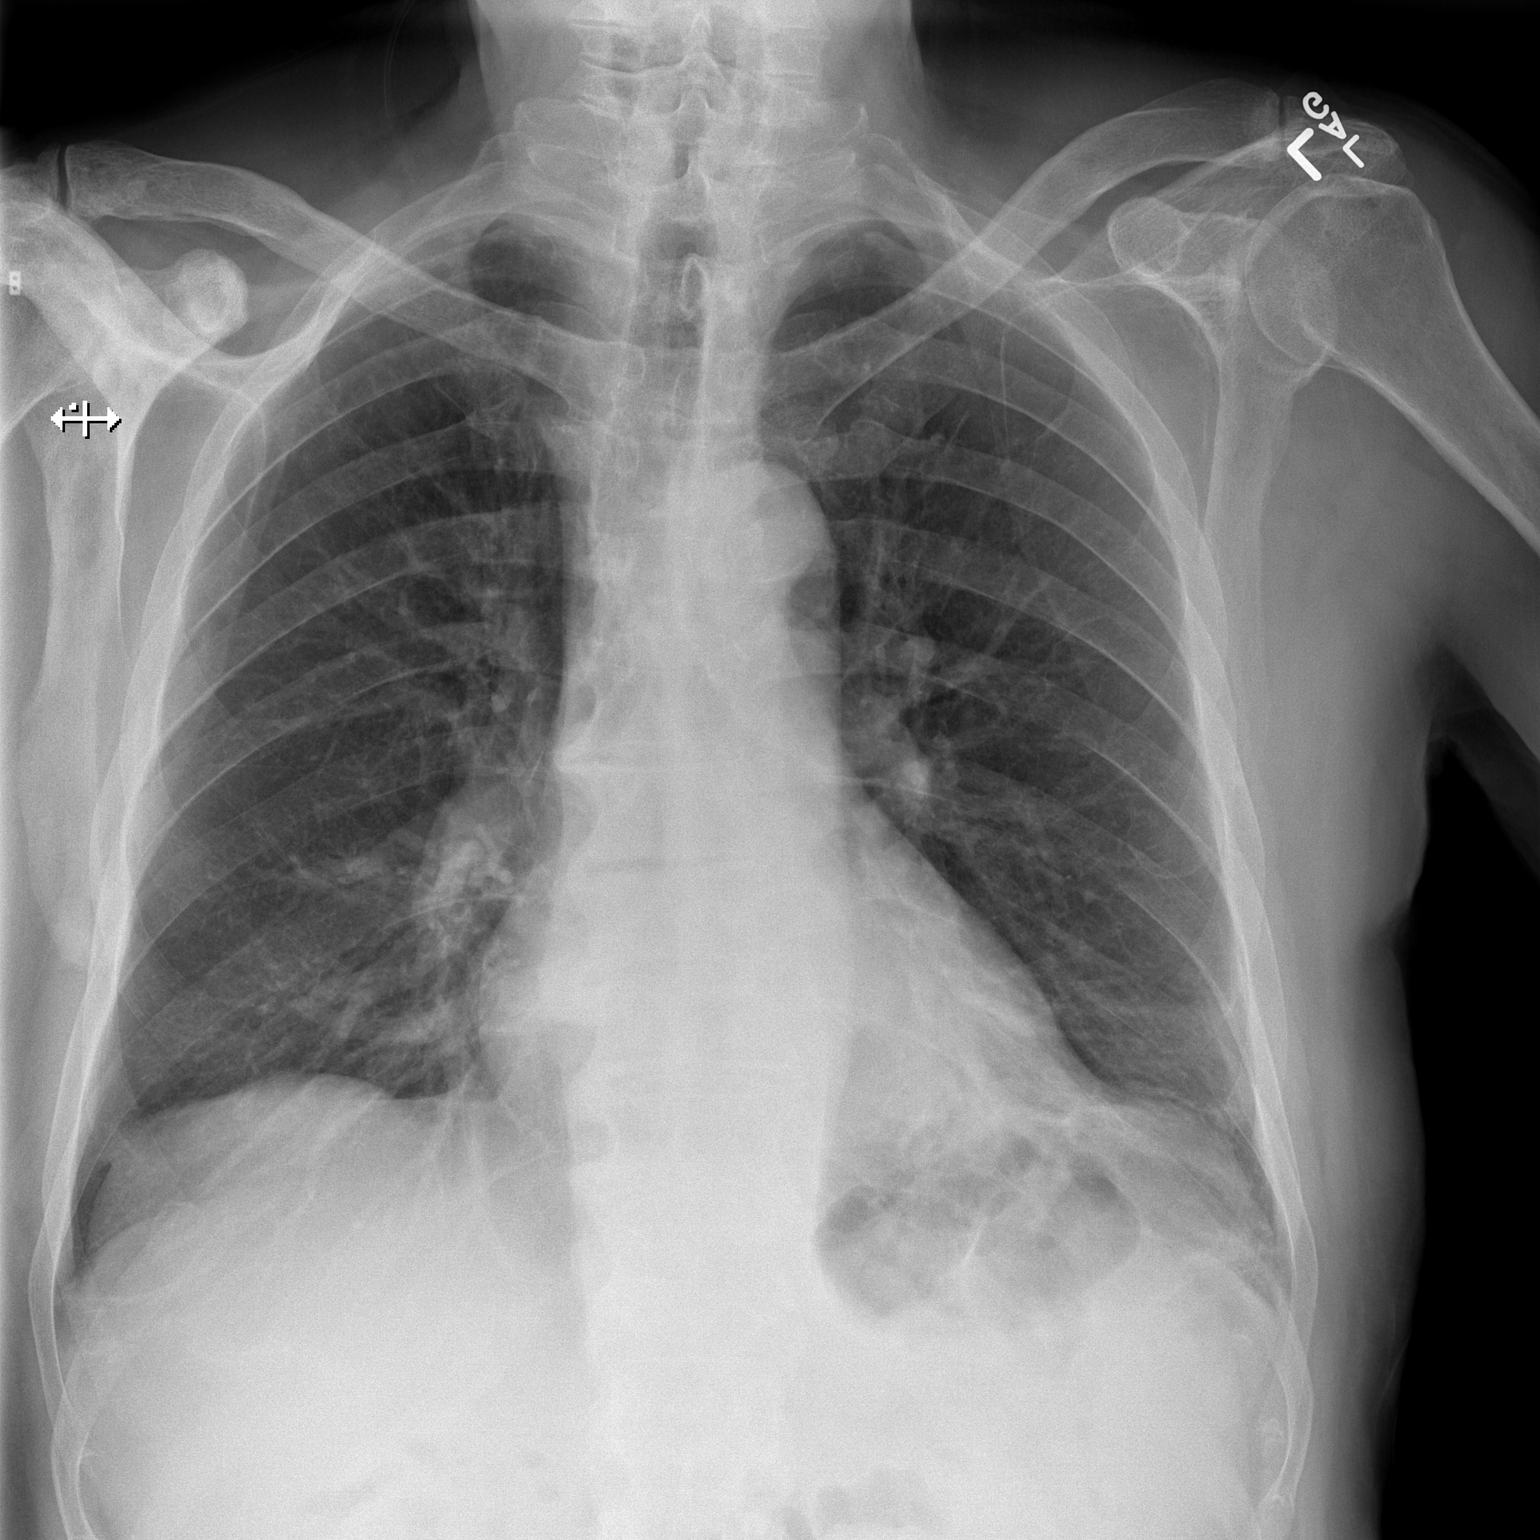

[w chest lat]
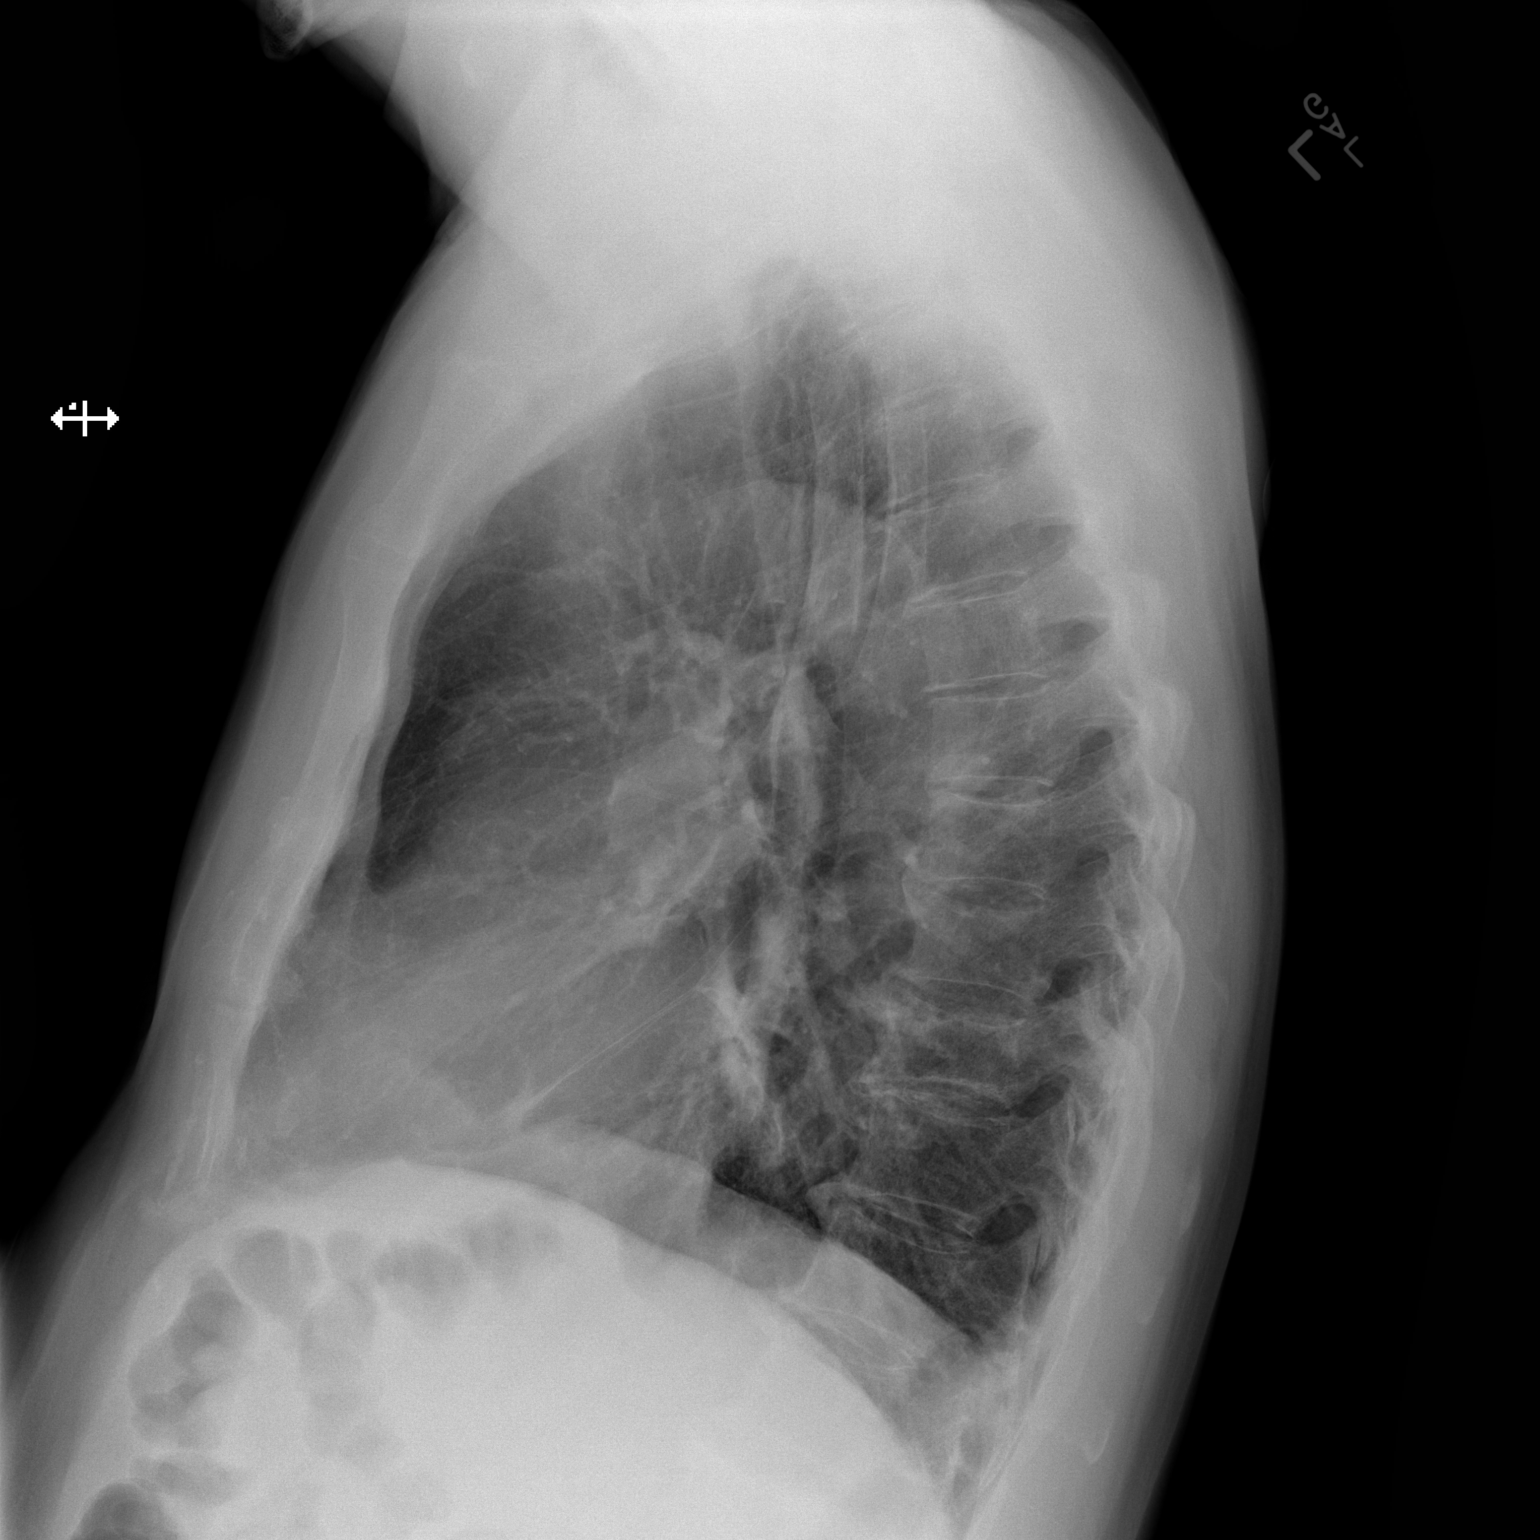

[2 of 2 positions shown; findings below may reference images not displayed]

FINDINGS: Cardiomediastinal contours are within normal range.  Mild
interstitial coarsening.  Mild left lung base opacity.  No pleural
effusion or pneumothorax.  Multilevel degenerative changes.
IMPRESSION: Mild left lung base opacity; atelectasis versus infiltrate.

Mild interstitial prominence may reflect chronic change or
atypical/viral infection.

## 2014-08-04 DIAGNOSIS — Z85828 Personal history of other malignant neoplasm of skin: Secondary | ICD-10-CM | POA: Diagnosis not present

## 2014-08-04 DIAGNOSIS — D485 Neoplasm of uncertain behavior of skin: Secondary | ICD-10-CM | POA: Diagnosis not present

## 2014-08-04 DIAGNOSIS — D239 Other benign neoplasm of skin, unspecified: Secondary | ICD-10-CM | POA: Diagnosis not present

## 2014-08-04 DIAGNOSIS — L820 Inflamed seborrheic keratosis: Secondary | ICD-10-CM | POA: Diagnosis not present

## 2014-08-11 DIAGNOSIS — D235 Other benign neoplasm of skin of trunk: Secondary | ICD-10-CM | POA: Diagnosis not present

## 2014-09-30 DIAGNOSIS — Z23 Encounter for immunization: Secondary | ICD-10-CM | POA: Diagnosis not present

## 2015-05-09 ENCOUNTER — Other Ambulatory Visit: Payer: Self-pay

## 2015-08-26 DIAGNOSIS — D485 Neoplasm of uncertain behavior of skin: Secondary | ICD-10-CM | POA: Diagnosis not present

## 2015-08-26 DIAGNOSIS — C44519 Basal cell carcinoma of skin of other part of trunk: Secondary | ICD-10-CM | POA: Diagnosis not present

## 2015-08-26 DIAGNOSIS — Z85828 Personal history of other malignant neoplasm of skin: Secondary | ICD-10-CM | POA: Diagnosis not present

## 2015-08-26 DIAGNOSIS — D1801 Hemangioma of skin and subcutaneous tissue: Secondary | ICD-10-CM | POA: Diagnosis not present

## 2015-08-26 DIAGNOSIS — L821 Other seborrheic keratosis: Secondary | ICD-10-CM | POA: Diagnosis not present

## 2015-08-26 DIAGNOSIS — D225 Melanocytic nevi of trunk: Secondary | ICD-10-CM | POA: Diagnosis not present

## 2015-08-26 DIAGNOSIS — L812 Freckles: Secondary | ICD-10-CM | POA: Diagnosis not present

## 2015-09-09 DIAGNOSIS — C44519 Basal cell carcinoma of skin of other part of trunk: Secondary | ICD-10-CM | POA: Diagnosis not present

## 2015-09-16 ENCOUNTER — Telehealth: Payer: Self-pay | Admitting: Cardiovascular Disease

## 2015-09-16 NOTE — Telephone Encounter (Signed)
3 attempts made to schedule from recall list.  LMOV to call office for scheduling.      Deleting recall.

## 2016-01-16 ENCOUNTER — Other Ambulatory Visit: Payer: Self-pay

## 2016-01-16 MED ORDER — METOPROLOL TARTRATE 25 MG PO TABS: 25.0000 mg | ORAL_TABLET | Freq: Two times a day (BID) | ORAL | Status: DC

## 2016-01-16 NOTE — Telephone Encounter (Signed)
Pt needs amiodarone refilled, but I did not see on med list. 90 day supply

## 2016-01-16 NOTE — Telephone Encounter (Signed)
Pt requiring appointment not see since 2015.

## 2016-01-18 ENCOUNTER — Telehealth: Payer: Self-pay | Admitting: Cardiovascular Disease

## 2016-01-18 ENCOUNTER — Other Ambulatory Visit: Payer: Self-pay

## 2016-01-18 ENCOUNTER — Other Ambulatory Visit: Payer: Self-pay | Admitting: *Deleted

## 2016-01-18 ENCOUNTER — Other Ambulatory Visit: Payer: Self-pay | Admitting: Cardiovascular Disease

## 2016-01-18 MED ORDER — AMIODARONE HCL 200 MG PO TABS: 200.0000 mg | ORAL_TABLET | Freq: Two times a day (BID) | ORAL | Status: DC | PRN

## 2016-01-18 MED ORDER — AMIODARONE HCL 200 MG PO TABS: 200.0000 mg | ORAL_TABLET | ORAL | Status: DC | PRN

## 2016-01-18 NOTE — Telephone Encounter (Signed)
Pt called, stating that he has been at his pharmacy for over an hr waiting on refill for his amiodarone.  This med was not on pt's med list, so there was some confusion on pt's dose and if he indeed is to be on this med. Pt's last ov 06/2014. Pt takes amiodarone 200 mg prn for atrial fibrillation, reports that he is back in afib, amio & metoprolol usually break it in a few days. He has some leftover amio at home, but it expired in 8/16 and he would like an updated rx until his next appt. Sent in amiodarone 200 mg 1 po prn for afib, #30 w/ no refills.  Advised pt to keep his upcoming appt w/ Dr. Rockey Situ and call back if we can be of further assistance.

## 2016-01-18 NOTE — Telephone Encounter (Signed)
Pt requesting Amiodarone 200 mg Refill pt has not been seen since 2015.  Pt does not have medication on med-list. Please advise.

## 2016-01-25 ENCOUNTER — Other Ambulatory Visit: Payer: Self-pay

## 2016-01-25 MED ORDER — METOPROLOL TARTRATE 25 MG PO TABS: 25.0000 mg | ORAL_TABLET | Freq: Two times a day (BID) | ORAL | Status: DC

## 2016-01-31 ENCOUNTER — Telehealth: Payer: Self-pay | Admitting: Family Medicine

## 2016-01-31 NOTE — Telephone Encounter (Signed)
LM for pt to sch AWV with Katha Cabal, [place]

## 2016-02-27 ENCOUNTER — Telehealth: Payer: Self-pay | Admitting: Cardiovascular Disease

## 2016-02-27 NOTE — Telephone Encounter (Signed)
lmov for patient to call back and reschedule the appointment  Due to Provider not being in office.

## 2016-03-02 ENCOUNTER — Ambulatory Visit (INDEPENDENT_AMBULATORY_CARE_PROVIDER_SITE_OTHER): Payer: Medicare Other | Admitting: Cardiovascular Disease

## 2016-03-02 ENCOUNTER — Encounter: Payer: Self-pay | Admitting: Cardiovascular Disease

## 2016-03-02 VITALS — BP 134/74 | HR 62 | Ht 71.0 in | Wt 198.2 lb

## 2016-03-02 DIAGNOSIS — I4891 Unspecified atrial fibrillation: Secondary | ICD-10-CM

## 2016-03-02 DIAGNOSIS — Z634 Disappearance and death of family member: Secondary | ICD-10-CM

## 2016-03-02 DIAGNOSIS — E785 Hyperlipidemia, unspecified: Secondary | ICD-10-CM

## 2016-03-02 DIAGNOSIS — F4321 Adjustment disorder with depressed mood: Secondary | ICD-10-CM | POA: Diagnosis not present

## 2016-03-02 MED ORDER — FUROSEMIDE 20 MG PO TABS: 20.0000 mg | ORAL_TABLET | Freq: Every day | ORAL | Status: DC | PRN

## 2016-03-02 MED ORDER — METOPROLOL TARTRATE 25 MG PO TABS: 25.0000 mg | ORAL_TABLET | Freq: Two times a day (BID) | ORAL | Status: DC | PRN

## 2016-03-02 MED ORDER — AMIODARONE HCL 200 MG PO TABS: 200.0000 mg | ORAL_TABLET | ORAL | Status: DC | PRN

## 2016-03-02 NOTE — Progress Notes (Signed)
Patient ID: [name] [name], male    DOB: 23-Aug-1941, 75 y.o.   MRN: [id number]  HPI Comments: 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 stress at home, daughter had cancer, died in 2015-10-08  In general he reports that he has been doing well He had atrial fibrillation 08-Oct-2015 in the setting of the death of his daughter She had cancer, placed on hospice, was living with them at home.  Daughter had 2 children, husband. Atrial fibrillation episode that time lasted 27 hours approximately. He took amiodarone 2 pills with repeat several hours later also took metoprolol. Does not want anticoagulation, he prefers to take pradaxa as needed for episodes. He is requesting more samples today  He also reports having rare episodes of ectopy For these episodes he takes half dose metoprolol  Active, does his gardening, no regular exercise program, likes to travel  EKG on today's visit shows normal sinus rhythm with rate 62 bpm, no significant ST or T-wave changes  Other past medical history reviewed episode of atrial fibrillation 06/11/2014. This lasted less than one day. He does take amiodarone as needed to restore normal sinus rhythm.  Since the event at the end of July, he is taking metoprolol 12.5 mg twice a day He also takes extra metoprolol for breakthrough arrhythmia  Previous episodes of atrial fibrillation in 01/21/2013 lasting 15 hours and 06/26/2015 lasting less than 24 hours.  In August 2014, he had had his esophagus stretched and he feels that this attributed to his recurrent arrhythmia.  He is unable to tolerate amiodarone on a regular basis secondary to bradycardia with heart rates in the high 40s, low 50s.     cardiac catheterization in June 2007 that  showed no significant coronary artery disease. Ejection fraction at that time was 45-55% and he was in atrial fibrillation with a rapid rate.       No Known Allergies  Current Outpatient Prescriptions on File Prior to Visit  Medication Sig Dispense Refill  . amiodarone (PACERONE) 200 MG tablet Take 1 tablet (200 mg total) by mouth as needed (for atrial fibrillaton). (Patient taking differently: Take 400 mg by mouth 2 (two) times daily. ) 30 tablet 0  . aspirin EC 81 MG tablet Take 81 mg by mouth daily.     . cholecalciferol (VITAMIN D) 1000 UNITS tablet Take 5,000 Units by mouth every morning.     . fish oil-omega-3 fatty acids 1000 MG capsule Take 2 g by mouth every morning.    . Glucosamine HCl-MSM 1500-500 MG/30ML LIQD Take 10 mLs by mouth every morning.     . Magnesium 400 MG CAPS Take 400 mg by mouth every morning.     . metoprolol tartrate (LOPRESSOR) 25 MG tablet Take 1 tablet (25 mg total) by mouth 2 (two) times daily. (Patient taking differently: Take 25 mg by mouth 3 (three) times daily. ) 60 tablet 0  . Multiple Vitamin (MULTIVITAMIN WITH MINERALS) TABS Take 1 tablet by mouth every morning.    . NON FORMULARY 2 scoop 2 (two) times daily as needed (when in A-fib). Calm Plus (fast magnesium intake) 2 rounded teaspoons in 8 ounces of water twice daily    . NON FORMULARY Take 1,000 mg by mouth daily. Tumeric    . vitamin C (ASCORBIC ACID)  500 MG tablet Take 1,000 mg by mouth 2 (two) times daily.        Past Medical History  Diagnosis Date  . CHF (congestive heart failure) ([place])     history of this  . Paroxysmal atrial fibrillation (HCC)   . Paget's disease     Dr. [name] [facility])  . Prostate cancer ([place]) 1998    s/p seed implants [name]) in remission  . Basal cell carcinoma of cheek   . Arthritis     Past Surgical History  Procedure Laterality Date  . Cardioversion  2010    A-Fib  . Cardiac catheterization  2007    No significant CAD  . Inguinal hernia  repair  2007  . Hammer toe surgery  04/21/2012    amputation for paget's  . Esophagogastroduodenoscopy N/A 02/24/2013    Procedure: ESOPHAGOGASTRODUODENOSCOPY (EGD);  Surgeon: [name], MD;  Location: [facility];  Service: Endoscopy  . Savory dilation N/A 02/24/2013    Procedure: SAVORY DILATION;  Surgeon: [name], MD;  Location: [facility];  Service: Endoscopy;  Laterality: N/A;  . Shoulder surgery Right 2004    RTC  . Radioactive seed implant  1998    Prostate cancer [name])  . Basal cell carcinoma excision      Social History  reports that he quit smoking about 6 years ago. His smoking use included Cigars. He has never used smokeless tobacco. He reports that he drinks about 0.5 oz of alcohol per week. He reports that he does not use illicit drugs.  Family History family history includes Arrhythmia in his mother; Arthritis in his mother; Cancer in his brother; Diabetes in his mother; Stroke in his father. There is no history of CAD.   Review of Systems  Constitutional: Negative.   Respiratory: Negative.   Cardiovascular: Negative.   Gastrointestinal: Negative.   Musculoskeletal: Negative.   Neurological: Negative.   Hematological: Negative.   Psychiatric/Behavioral: Negative.   All other systems reviewed and are negative.   BP 134/74 mmHg  Pulse 62  Ht 5\' 11"  (1.803 m)  Wt 198 lb 4 oz (89.926 kg)  BMI 27.66 kg/m2  Physical Exam  Constitutional: He is oriented to person, place, and time. He appears well-developed and well-nourished.  HENT:  Head: Normocephalic.  Nose: Nose normal.  Mouth/Throat: Oropharynx is clear and moist.  Eyes: Conjunctivae are normal. Pupils are equal, round, and reactive to light.  Neck: Normal range of motion. Neck supple. No JVD present.  Cardiovascular: Normal rate, regular rhythm, S1 normal, S2 normal and intact distal pulses.  Exam reveals no gallop and no friction rub.   Murmur heard.  Systolic murmur is present  with a grade of 1/6  Pulmonary/Chest: Effort normal and breath sounds normal. No respiratory distress. He has no wheezes. He has no rales. He exhibits no tenderness.  Abdominal: Soft. Bowel sounds are normal. He exhibits no distension. There is no tenderness.  Musculoskeletal: Normal range of motion. He exhibits no edema or tenderness.  Lymphadenopathy:    He has no cervical adenopathy.  Neurological: He is alert and oriented to person, place, and time. Coordination normal.  Skin: Skin is warm and dry. No rash noted. No erythema.  Psychiatric: He has a normal mood and affect. His behavior is normal. Judgment and thought content normal.      Assessment and Plan   Nursing note and vitals reviewed.

## 2016-03-02 NOTE — Assessment & Plan Note (Signed)
Currently not on a statin. Prefers diet and exercise. CT coronary calcium scoring discussed with him. He is currently not interested.   Total encounter time more than 25 minutes  Greater than 50% was spent in counseling and coordination of care with the patient

## 2016-03-02 NOTE — Patient Instructions (Addendum)
You are doing well. No medication changes were made.  Samples of pradaxa 150 mg twice a day  Please call us if you have new issues that need to be addressed before your next appt.  Your physician wants you to follow-up in: 12 months.  You will receive a reminder letter in the mail two months in advance. If you don't receive a letter, please call our office to schedule the follow-up appointment.

## 2016-03-02 NOTE — Assessment & Plan Note (Signed)
Long discussion today concerning the loss of his daughter November 2016 from cancer, she was in her 35s. She leaves 2 children, husband She had lung cancer

## 2016-03-02 NOTE — Assessment & Plan Note (Signed)
Paroxysmal atrial fibrillation, he reports last episode last November 2016 in the setting of stress, loss of his daughter. He prefers not to take medications on daily basis. Reports episodes of atrial fibrillation are rare, typically present with stress. He prefers to continue his current regimen, with amiodarone as needed with metoprolol. She does not want anticoagulation on a regular basis. Risk and benefit of anticoagulation discussed with him. He is aware of stroke risk. Prefers to take Pradaxa as needed for episodes

## 2016-03-15 ENCOUNTER — Ambulatory Visit: Payer: Medicare Other | Admitting: Cardiovascular Disease

## 2016-05-17 ENCOUNTER — Other Ambulatory Visit: Payer: Medicare Other

## 2016-05-17 ENCOUNTER — Ambulatory Visit: Payer: Medicare Other

## 2016-05-22 ENCOUNTER — Encounter: Payer: Medicare Other | Admitting: Family Medicine

## 2016-05-27 ENCOUNTER — Other Ambulatory Visit: Payer: Self-pay | Admitting: Family Medicine

## 2016-05-27 DIAGNOSIS — I4891 Unspecified atrial fibrillation: Secondary | ICD-10-CM

## 2016-05-27 DIAGNOSIS — C61 Malignant neoplasm of prostate: Secondary | ICD-10-CM

## 2016-05-27 DIAGNOSIS — E559 Vitamin D deficiency, unspecified: Secondary | ICD-10-CM

## 2016-05-27 DIAGNOSIS — E785 Hyperlipidemia, unspecified: Secondary | ICD-10-CM

## 2016-05-28 ENCOUNTER — Ambulatory Visit (INDEPENDENT_AMBULATORY_CARE_PROVIDER_SITE_OTHER): Payer: Medicare Other

## 2016-05-28 ENCOUNTER — Other Ambulatory Visit (INDEPENDENT_AMBULATORY_CARE_PROVIDER_SITE_OTHER): Payer: Medicare Other

## 2016-05-28 VITALS — BP 124/70 | HR 57 | Temp 98.4°F | Ht 70.0 in | Wt 198.8 lb

## 2016-05-28 DIAGNOSIS — Z0000 Encounter for general adult medical examination without abnormal findings: Secondary | ICD-10-CM | POA: Diagnosis not present

## 2016-05-28 DIAGNOSIS — Z23 Encounter for immunization: Secondary | ICD-10-CM

## 2016-05-28 DIAGNOSIS — C61 Malignant neoplasm of prostate: Secondary | ICD-10-CM | POA: Diagnosis not present

## 2016-05-28 DIAGNOSIS — E785 Hyperlipidemia, unspecified: Secondary | ICD-10-CM | POA: Diagnosis not present

## 2016-05-28 DIAGNOSIS — E559 Vitamin D deficiency, unspecified: Secondary | ICD-10-CM

## 2016-05-28 DIAGNOSIS — I4891 Unspecified atrial fibrillation: Secondary | ICD-10-CM | POA: Diagnosis not present

## 2016-05-28 LAB — BASIC METABOLIC PANEL
BUN: 19 mg/dL (ref 6–23)
CHLORIDE: 106 meq/L (ref 96–112)
CO2: 29 meq/L (ref 19–32)
CREATININE: 1.1 mg/dL (ref 0.40–1.50)
Calcium: 9.6 mg/dL (ref 8.4–10.5)
GFR: 69.05 mL/min (ref >60.00)
Glucose, Bld: 86 mg/dL (ref 70–99)
Potassium: 4.1 mEq/L (ref 3.5–5.1)
Sodium: 141 mEq/L (ref 135–145)

## 2016-05-28 LAB — LIPID PANEL
CHOLESTEROL: 187 mg/dL (ref 0–200)
HDL: 60.6 mg/dL (ref >39.00)
LDL Cholesterol: 116 mg/dL — ABNORMAL HIGH (ref 0–99)
NONHDL: 126.3
Total CHOL/HDL Ratio: 3
Triglycerides: 51 mg/dL (ref 0.0–149.0)
VLDL: 10.2 mg/dL (ref 0.0–40.0)

## 2016-05-28 LAB — VITAMIN D 25 HYDROXY (VIT D DEFICIENCY, FRACTURES): VITD: 43.87 ng/mL (ref 30.00–100.00)

## 2016-05-28 LAB — PSA: PSA: 0.07 ng/mL — ABNORMAL LOW (ref 0.10–4.00)

## 2016-05-28 NOTE — Progress Notes (Signed)
PCP notes:  PCV13- administered Shingles - declined Tetanus - per pt, last vaccine in 2012  Abnormal screenings:  Hearing - failed Fall risk - hx of fall without of injury  Patient concerns: Pt would like ears assessed for cerumen buildup. If none present, would like to discuss audiology referral.   Nurse concerns: None  Next PCP appt: 06/05/16 @ 0930

## 2016-05-28 NOTE — Progress Notes (Signed)
Subjective:   [name] [name] is a 75 y.o. male who presents for Medicare Annual/Subsequent preventive examination.  Review of Systems:  N/A Cardiac Risk Factors include: advanced age (>69men, >69 women);male gender;dyslipidemia     Objective:    Vitals: BP 124/70 mmHg  Pulse 57  Temp(Src) 98.4 F (36.9 C) (Oral)  Ht 5\' 10"  (1.778 m)  Wt 198 lb 12 oz (90.152 kg)  BMI 28.52 kg/m2  SpO2 93%  Body mass index is 28.52 kg/(m^2).  Tobacco History  Smoking status  . Former Smoker -- 20 years  . Types: Cigars  . Quit date: 03/11/2009  Smokeless tobacco  . Never Used     Counseling given: No   Past Medical History  Diagnosis Date  . CHF (congestive heart failure) ([place])     history of this  . Paroxysmal atrial fibrillation (HCC)   . Paget's disease     Dr. [name] [facility])  . Prostate cancer ([place]) 1998    s/p seed implants [name]) in remission  . Basal cell carcinoma of cheek   . Arthritis    Past Surgical History  Procedure Laterality Date  . Cardioversion  2010    A-Fib  . Cardiac catheterization  2007    No significant CAD  . Inguinal hernia repair  2007  . Hammer toe surgery  04/21/2012    amputation for paget's  . Esophagogastroduodenoscopy N/A 02/24/2013    Procedure: ESOPHAGOGASTRODUODENOSCOPY (EGD);  Surgeon: [name], MD;  Location: [facility];  Service: Endoscopy  . Savory dilation N/A 02/24/2013    Procedure: SAVORY DILATION;  Surgeon: [name], MD;  Location: [facility];  Service: Endoscopy;  Laterality: N/A;  . Shoulder surgery Right 2004    RTC  . Radioactive seed implant  1998    Prostate cancer [name])  . Basal cell carcinoma excision     Family History  Problem Relation Age of Onset  . Cancer Brother     prostate  . Arthritis Mother   . Stroke Father   . Arrhythmia Mother   . Diabetes Mother   . CAD Neg Hx    History  Sexual Activity  . Sexual Activity: Yes  . Birth Control/ Protection: Sponge      Outpatient Encounter Prescriptions as of 05/28/2016  Medication Sig  . aspirin EC 81 MG tablet Take 81 mg by mouth daily.   . cholecalciferol (VITAMIN D) 1000 UNITS tablet Take 5,000 Units by mouth every morning.   . fish oil-omega-3 fatty acids 1000 MG capsule Take 2 g by mouth every morning.  . furosemide (LASIX) 20 MG tablet Take 1 tablet (20 mg total) by mouth daily as needed.  . Glucosamine HCl-MSM 1500-500 MG/30ML LIQD Take 10 mLs by mouth every morning.   . Magnesium 400 MG CAPS Take 400 mg by mouth every morning.   . Multiple Vitamin (MULTIVITAMIN WITH MINERALS) TABS Take 1 tablet by mouth every morning.  . NON FORMULARY Take 1,000 mg by mouth daily. Tumeric  . RESVERATROL PO Take 20 mg by mouth daily.  . vitamin C (ASCORBIC ACID) 500 MG tablet Take 1,000 mg by mouth 2 (two) times daily.   Marland Kitchen amiodarone (PACERONE) 200 MG tablet Take 1 tablet (200 mg total) by mouth as needed (for atrial fibrillaton). (Patient not taking: Reported on 05/28/2016)  . dabigatran (PRADAXA) 150 MG CAPS capsule Take 150 mg by mouth daily as needed. Reported on 05/28/2016  . metoprolol tartrate (LOPRESSOR) 25 MG  tablet Take 1 tablet (25 mg total) by mouth 2 (two) times daily as needed. (Patient not taking: Reported on 05/28/2016)  . NON FORMULARY 2 scoop 2 (two) times daily as needed (when in A-fib). Reported on 05/28/2016   No facility-administered encounter medications on file as of 05/28/2016.    Activities of Daily Living In your present state of health, do you have any difficulty performing the following activities: 05/28/2016  Hearing? N  Vision? N  Difficulty concentrating or making decisions? N  Walking or climbing stairs? N  Dressing or bathing? N  Doing errands, shopping? N  Preparing Food and eating ? N  Using the Toilet? N  In the past six months, have you accidently leaked urine? N  Do you have problems with loss of bowel control? N  Managing your Medications? N  Managing your Finances? N   Housekeeping or managing your Housekeeping? N    Patient Care Team: [name], MD as PCP - General (Family Medicine) Clent Jacks, MD as Consulting Physician (Ophthalmology) Nevada Crane, MD as Referring Physician (Internal Medicine) Minna Merritts, MD as Consulting Physician (Cardiology)   Assessment:     Hearing Screening   125Hz  250Hz  500Hz  1000Hz  2000Hz  4000Hz  8000Hz   Right ear:   40 40 40 0   Left ear:   40 0 40 0   Vision Screening Comments: Last vision exam with Dr. Katy Fitch in May 2016. 2 yr frequency for exams.    Exercise Activities and Dietary recommendations Current Exercise Habits: Home exercise routine, Type of exercise: walking;strength training/weights;Other - see comments (golf), Time (Minutes): 45, Frequency (Times/Week): 7, Weekly Exercise (Minutes/Week): 315, Intensity: Moderate, Exercise limited by: None identified  Goals    . Increase physical activity     Starting 05/28/2016, I will continue to exercise at least 30 min daily.       Fall Risk Fall Risk  05/28/2016 11/19/2013  Falls in the past year? Yes No  Number falls in past yr: 1 -  Injury with Fall? No -  Follow up Falls evaluation completed -   Depression Screen PHQ 2/9 Scores 05/28/2016 11/19/2013  PHQ - 2 Score 0 0    Cognitive Testing MMSE - Mini Mental State Exam 05/28/2016  Orientation to time 5  Orientation to Place 5  Registration 3  Attention/ Calculation 0  Recall 3  Language- name 2 objects 0  Language- repeat 1  Language- follow 3 step command 3  Language- read & follow direction 0  Write a sentence 0  Copy design 0  Total score 20   PLEASE NOTE: A Mini-Cog screen was completed. Maximum score is 20. A value of 0 denotes this part of [place]stein MMSE was not completed or the patient failed this part of the Mini-Cog screening.   Mini-Cog Screening Orientation to Time - Max 5 pts Orientation to Place - Max 5 pts Registration - Max 3 pts Recall - Max 3 pts Language Repeat -  Max 1 pts Language Follow 3 Step Command - Max 3 pts  Immunization History  Administered Date(s) Administered  . Pneumococcal Conjugate-13 05/28/2016  . Pneumococcal Polysaccharide-23 11/19/2013   Screening Tests Health Maintenance  Topic Date Due  . ZOSTAVAX  05/28/2049 (Originally 09/17/1999)  . INFLUENZA VACCINE  06/12/2016  . TETANUS/TDAP  11/12/2020  . DTaP/Tdap/Td  Completed  . PNA vac Low Risk Adult  Completed      Plan:    I have personally reviewed and addressed the Medicare Annual Wellness  questionnaire and have noted the following in the patient's chart:  A. Medical and social history B. Use of alcohol, tobacco or illicit drugs  C. Current medications and supplements D. Functional ability and status E.  Nutritional status F.  Physical activity G. Advance directives H. List of other physicians I.  Hospitalizations, surgeries, and ER visits in previous 12 months J.  [place] to include hearing, vision, cognitive, depression L. Referrals and appointments - none  In addition, I have reviewed and discussed with patient certain preventive protocols, quality metrics, and best practice recommendations. A written personalized care plan for preventive services as well as general preventive health recommendations were provided to patient.  See attached scanned questionnaire for additional information.   Signed,   Lindell Noe, MHA, BS, LPN Health Advisor

## 2016-05-28 NOTE — Progress Notes (Signed)
Pre visit review using our clinic review tool, if applicable. No additional management support is needed unless otherwise documented below in the visit note. 

## 2016-05-28 NOTE — Patient Instructions (Signed)
[name] [name] , Thank you for taking time to come for your Medicare Wellness Visit. I appreciate your ongoing commitment to your health goals. Please review the following plan we discussed and let me know if I can assist you in the future.   These are the goals we discussed: Goals    . Increase physical activity     Starting 05/28/2016, I will continue to exercise at least 30 min daily.        This is a list of the screening recommended for you and due dates:  Health Maintenance  Topic Date Due  . Shingles Vaccine  05/28/2049*  . Flu Shot  06/12/2016  . Tetanus Vaccine  11/12/2020  . DTaP/Tdap/Td vaccine  Completed  . Pneumonia vaccines  Completed  *Topic was postponed. The date shown is not the original due date.    Preventive Care for Adults  A healthy lifestyle and preventive care can promote health and wellness. Preventive health guidelines for adults include the following key practices.  . A routine yearly physical is a good way to check with your health care provider about your health and preventive screening. It is a chance to share any concerns and updates on your health and to receive a thorough exam.  . Visit your dentist for a routine exam and preventive care every 6 months. Brush your teeth twice a day and floss once a day. Good oral hygiene prevents tooth decay and gum disease.  . The frequency of eye exams is based on your age, health, family medical history, use  of contact lenses, and other factors. Follow your health care provider's ecommendations for frequency of eye exams.  . Eat a healthy diet. Foods like vegetables, fruits, whole grains, low-fat dairy products, and lean protein foods contain the nutrients you need without too many calories. Decrease your intake of foods high in solid fats, added sugars, and salt. Eat the right amount of calories for you. Get information about a proper diet from your health care provider, if necessary.  . Regular physical exercise  is one of the most important things you can do for your health. Most adults should get at least 150 minutes of moderate-intensity exercise (any activity that increases your heart rate and causes you to sweat) each week. In addition, most adults need muscle-strengthening exercises on 2 or more days a week.  Silver Sneakers may be a benefit available to you. To determine eligibility, you may visit the website: www.silversneakers.com or contact program at 580 285 9626 Mon-Fri between 8AM-8PM.   . Maintain a healthy weight. The body mass index (BMI) is a screening tool to identify possible weight problems. It provides an estimate of body fat based on height and weight. Your health care provider can find your BMI and can help you achieve or maintain a healthy weight.   For adults 20 years and older: ? A BMI below 18.5 is considered underweight. ? A BMI of 18.5 to 24.9 is normal. ? A BMI of 25 to 29.9 is considered overweight. ? A BMI of 30 and above is considered obese.   . Maintain normal blood lipids and cholesterol levels by exercising and minimizing your intake of saturated fat. Eat a balanced diet with plenty of fruit and vegetables. Blood tests for lipids and cholesterol should begin at age 21 and be repeated every 5 years. If your lipid or cholesterol levels are high, you are over 50, or you are at high risk for heart disease, you may need your  cholesterol levels checked more frequently. Ongoing high lipid and cholesterol levels should be treated with medicines if diet and exercise are not working.  . If you smoke, find out from your health care provider how to quit. If you do not use tobacco, please do not start.  . If you choose to drink alcohol, please do not consume more than 2 drinks per day. One drink is considered to be 12 ounces (355 mL) of beer, 5 ounces (148 mL) of wine, or 1.5 ounces (44 mL) of liquor.  . If you are 14-71 years old, ask your health care provider if you should take  aspirin to prevent strokes.  . Use sunscreen. Apply sunscreen liberally and repeatedly throughout the day. You should seek shade when your shadow is shorter than you. Protect yourself by wearing long sleeves, pants, a wide-brimmed hat, and sunglasses year round, whenever you are outdoors.  . Once a month, do a whole body skin exam, using a mirror to look at the skin on your back. Tell your health care provider of new moles, moles that have irregular borders, moles that are larger than a pencil eraser, or moles that have changed in shape or color.

## 2016-05-29 LAB — CBC WITH DIFFERENTIAL/PLATELET
BASOS PCT: 0.9 % (ref 0.0–3.0)
Basophils Absolute: 0 10*3/uL (ref 0.0–0.1)
EOS PCT: 3 % (ref 0.0–5.0)
Eosinophils Absolute: 0.1 10*3/uL (ref 0.0–0.7)
HCT: 43.1 % (ref 39.0–52.0)
Hemoglobin: 14.6 g/dL (ref 13.0–17.0)
LYMPHS PCT: 25.1 % (ref 12.0–46.0)
Lymphs Abs: 1.2 10*3/uL (ref 0.7–4.0)
MCHC: 34 g/dL (ref 30.0–36.0)
MCV: 96.6 fl (ref 78.0–100.0)
MONO ABS: 0.5 10*3/uL (ref 0.1–1.0)
Monocytes Relative: 9.7 % (ref 3.0–12.0)
NEUTROS PCT: 61.3 % (ref 43.0–77.0)
Neutro Abs: 3 10*3/uL (ref 1.4–7.7)
PLATELETS: 185 10*3/uL (ref 150.0–400.0)
RBC: 4.46 Mil/uL (ref 4.22–5.81)
RDW: 14.2 % (ref 11.5–15.5)
WBC: 4.9 10*3/uL (ref 4.0–10.5)

## 2016-05-29 NOTE — Progress Notes (Signed)
I reviewed health advisor's note, was available for consultation, and agree with documentation and plan.  

## 2016-06-05 ENCOUNTER — Encounter: Payer: Self-pay | Admitting: Family Medicine

## 2016-06-05 ENCOUNTER — Ambulatory Visit (INDEPENDENT_AMBULATORY_CARE_PROVIDER_SITE_OTHER): Payer: Medicare Other | Admitting: Family Medicine

## 2016-06-05 VITALS — BP 130/66 | HR 64 | Temp 98.0°F | Wt 199.8 lb

## 2016-06-05 DIAGNOSIS — I480 Paroxysmal atrial fibrillation: Secondary | ICD-10-CM | POA: Diagnosis not present

## 2016-06-05 DIAGNOSIS — C61 Malignant neoplasm of prostate: Secondary | ICD-10-CM | POA: Diagnosis not present

## 2016-06-05 DIAGNOSIS — Z7189 Other specified counseling: Secondary | ICD-10-CM | POA: Insufficient documentation

## 2016-06-05 DIAGNOSIS — M889 Osteitis deformans of unspecified bone: Secondary | ICD-10-CM | POA: Diagnosis not present

## 2016-06-05 DIAGNOSIS — H6123 Impacted cerumen, bilateral: Secondary | ICD-10-CM

## 2016-06-05 DIAGNOSIS — H6120 Impacted cerumen, unspecified ear: Secondary | ICD-10-CM

## 2016-06-05 DIAGNOSIS — F4321 Adjustment disorder with depressed mood: Secondary | ICD-10-CM

## 2016-06-05 DIAGNOSIS — Z634 Disappearance and death of family member: Secondary | ICD-10-CM

## 2016-06-05 DIAGNOSIS — E785 Hyperlipidemia, unspecified: Secondary | ICD-10-CM

## 2016-06-05 HISTORY — DX: Impacted cerumen, unspecified ear: H61.20

## 2016-06-05 LAB — ALKALINE PHOSPHATASE: ALK PHOS: 147 U/L — AB (ref 39–117)

## 2016-06-05 NOTE — Assessment & Plan Note (Signed)
Appreciate cardiology care of patient.  

## 2016-06-05 NOTE — Assessment & Plan Note (Signed)
Advanced directives: has at home.  Will bring me copy.  Wife would be HC proxy then daughter.

## 2016-06-05 NOTE — Assessment & Plan Note (Addendum)
Discussed recent loss, offered my condolences.

## 2016-06-05 NOTE — Assessment & Plan Note (Signed)
PSA stable. Continue yearly monitoring. No longer sees urology

## 2016-06-05 NOTE — Assessment & Plan Note (Addendum)
L ear disimpaction with plastic curette performed. R ear irrigation performed.  Hearing screen passed after this.

## 2016-06-05 NOTE — Patient Instructions (Addendum)
We will sign you up for cologuard.  Sign release for records of colonoscopy 2000s with Dr Dorice Lamas today for alkaline phosphatase.  Ears cleared today, repeat hearing screen performed.  Bring me copy of living will to update your chart.   Health Maintenance, Male A healthy lifestyle and preventative care can promote health and wellness.  Maintain regular health, dental, and eye exams.  Eat a healthy diet. Foods like vegetables, fruits, whole grains, low-fat dairy products, and lean protein foods contain the nutrients you need and are low in calories. Decrease your intake of foods high in solid fats, added sugars, and salt. Get information about a proper diet from your health care provider, if necessary.  Regular physical exercise is one of the most important things you can do for your health. Most adults should get at least 150 minutes of moderate-intensity exercise (any activity that increases your heart rate and causes you to sweat) each week. In addition, most adults need muscle-strengthening exercises on 2 or more days a week.   Maintain a healthy weight. The body mass index (BMI) is a screening tool to identify possible weight problems. It provides an estimate of body fat based on height and weight. Your health care provider can find your BMI and can help you achieve or maintain a healthy weight. For males 20 years and older:  A BMI below 18.5 is considered underweight.  A BMI of 18.5 to 24.9 is normal.  A BMI of 25 to 29.9 is considered overweight.  A BMI of 30 and above is considered obese.  Maintain normal blood lipids and cholesterol by exercising and minimizing your intake of saturated fat. Eat a balanced diet with plenty of fruits and vegetables. Blood tests for lipids and cholesterol should begin at age 3 and be repeated every 5 years. If your lipid or cholesterol levels are high, you are over age 34, or you are at high risk for heart disease, you may need your cholesterol  levels checked more frequently.Ongoing high lipid and cholesterol levels should be treated with medicines if diet and exercise are not working.  If you smoke, find out from your health care provider how to quit. If you do not use tobacco, do not start.  Lung cancer screening is recommended for adults aged 31-80 years who are at high risk for developing lung cancer because of a history of smoking. A yearly low-dose CT scan of the lungs is recommended for people who have at least a 30-pack-year history of smoking and are current smokers or have quit within the past 15 years. A pack year of smoking is smoking an average of 1 pack of cigarettes a day for 1 year (for example, a 30-pack-year history of smoking could mean smoking 1 pack a day for 30 years or 2 packs a day for 15 years). Yearly screening should continue until the smoker has stopped smoking for at least 15 years. Yearly screening should be stopped for people who develop a health problem that would prevent them from having lung cancer treatment.  If you choose to drink alcohol, do not have more than 2 drinks per day. One drink is considered to be 12 oz (360 mL) of beer, 5 oz (150 mL) of wine, or 1.5 oz (45 mL) of liquor.  Avoid the use of street drugs. Do not share needles with anyone. Ask for help if you need support or instructions about stopping the use of drugs.  High blood pressure causes heart disease  and increases the risk of stroke. High blood pressure is more likely to develop in:  People who have blood pressure in the end of the normal range (100-139/85-89 mm Hg).  People who are overweight or obese.  People who are African American.  If you are 27-30 years of age, have your blood pressure checked every 3-5 years. If you are 6 years of age or older, have your blood pressure checked every year. You should have your blood pressure measured twice--once when you are at a hospital or clinic, and once when you are not at a hospital or  clinic. Record the average of the two measurements. To check your blood pressure when you are not at a hospital or clinic, you can use:  An automated blood pressure machine at a pharmacy.  A home blood pressure monitor.  If you are 49-51 years old, ask your health care provider if you should take aspirin to prevent heart disease.  Diabetes screening involves taking a blood sample to check your fasting blood sugar level. This should be done once every 3 years after age 79 if you are at a normal weight and without risk factors for diabetes. Testing should be considered at a younger age or be carried out more frequently if you are overweight and have at least 1 risk factor for diabetes.  Colorectal cancer can be detected and often prevented. Most routine colorectal cancer screening begins at the age of 36 and continues through age 102. However, your health care provider may recommend screening at an earlier age if you have risk factors for colon cancer. On a yearly basis, your health care provider may provide home test kits to check for hidden blood in the stool. A small camera at the end of a tube may be used to directly examine the colon (sigmoidoscopy or colonoscopy) to detect the earliest forms of colorectal cancer. Talk to your health care provider about this at age 85 when routine screening begins. A direct exam of the colon should be repeated every 5-10 years through age 60, unless early forms of precancerous polyps or small growths are found.  People who are at an increased risk for hepatitis B should be screened for this virus. You are considered at high risk for hepatitis B if:  You were born in a country where hepatitis B occurs often. Talk with your health care provider about which countries are considered high risk.  Your parents were born in a high-risk country and you have not received a shot to protect against hepatitis B (hepatitis B vaccine).  You have HIV or AIDS.  You use needles  to inject street drugs.  You live with, or have sex with, someone who has hepatitis B.  You are a man who has sex with other men (MSM).  You get hemodialysis treatment.  You take certain medicines for conditions like cancer, organ transplantation, and autoimmune conditions.  Hepatitis C blood testing is recommended for all people born from [birth year] through 1967 and any individual with known risk factors for hepatitis C.  Healthy men should no longer receive prostate-specific antigen (PSA) blood tests as part of routine cancer screening. Talk to your health care provider about prostate cancer screening.  Testicular cancer screening is not recommended for adolescents or adult males who have no symptoms. Screening includes self-exam, a health care provider exam, and other screening tests. Consult with your health care provider about any symptoms you have or any concerns you have about testicular cancer.  Practice safe sex. Use condoms and avoid high-risk sexual practices to reduce the spread of sexually transmitted infections (STIs).  You should be screened for STIs, including gonorrhea and chlamydia if:  You are sexually active and are younger than 24 years.  You are older than 24 years, and your health care provider tells you that you are at risk for this type of infection.  Your sexual activity has changed since you were last screened, and you are at an increased risk for chlamydia or gonorrhea. Ask your health care provider if you are at risk.  If you are at risk of being infected with HIV, it is recommended that you take a prescription medicine daily to prevent HIV infection. This is called pre-exposure prophylaxis (PrEP). You are considered at risk if:  You are a man who has sex with other men (MSM).  You are a heterosexual man who is sexually active with multiple partners.  You take drugs by injection.  You are sexually active with a partner who has HIV.  Talk with your health  care provider about whether you are at high risk of being infected with HIV. If you choose to begin PrEP, you should first be tested for HIV. You should then be tested every 3 months for as long as you are taking PrEP.  Use sunscreen. Apply sunscreen liberally and repeatedly throughout the day. You should seek shade when your shadow is shorter than you. Protect yourself by wearing long sleeves, pants, a wide-brimmed hat, and sunglasses year round whenever you are outdoors.  Tell your health care provider of new moles or changes in moles, especially if there is a change in shape or color. Also, tell your health care provider if a mole is larger than the size of a pencil eraser.  A one-time screening for abdominal aortic aneurysm (AAA) and surgical repair of large AAAs by ultrasound is recommended for men aged 35-75 years who are current or former smokers.  Stay current with your vaccines (immunizations).   This information is not intended to replace advice given to you by your health care provider. Make sure you discuss any questions you have with your health care provider.   Document Released: 04/26/2008 Document Revised: 11/19/2014 Document Reviewed: 03/26/2011 Elsevier Interactive Patient Education [employer].

## 2016-06-05 NOTE — Progress Notes (Signed)
BP 130/66   Pulse 64   Temp 98 F (36.7 C) (Oral)   Wt 199 lb 12 oz (90.6 kg)   BMI 28.66 kg/m    CC: f/u visit Subjective:    Patient ID: [name] [name], male    DOB: December 28, 1940, 75 y.o.   MRN: [id number]  HPI: [name] [name] is a 75 y.o. male presenting on 06/05/2016 for Annual Exam   Last seen by myself 11/2013. Saw Lesia last week for medicare wellness visit, note reviewed. Fall stumbled in yard.   73yo daughter passed away 2015/09/26 from stage 4 lung cancer. She has left husband and 2 children in her wake.   Afib followed by cardiology. One episode this year. Takes PRN amiodarone and metoprolol. Has not needed pradaxa.   Paget's disease  Sees Dr [name] Rheumatologist, last saw 2 yrs ago. Advised ok to monitor yearly ALP to ensure <400.   Preventative: Colonoscopy 2004 [name]). Pt reports friable area due to prostate treatment. Hesitant to repeat 2/2 friend who died from infection after colonoscopy. Discussed cologuard. Did not return  Prostate cancer - s/p radioactive seed implant 1998 [name]), does get PSA checked regularly  Flu - declines Td 2011 or 2012 per patient Pneumovax 2015, prevnar 2017 zostavax - declines Advanced directives: has at home.  Will bring me copy.  Wife would be HC proxy then daughter.  Seat belt use discussed Sunscreen use discussed. No changing moles on skin. Sees derm yearly.  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 Interim medical history since our last visit reviewed. Allergies and medications reviewed and updated. Current Outpatient Prescriptions on File Prior to Visit  Medication Sig  . cholecalciferol (VITAMIN  D) 1000 UNITS tablet Take 5,000 Units by mouth every morning.   . fish oil-omega-3 fatty acids 1000 MG capsule Take 1 g by mouth every morning.   . Magnesium 400 MG CAPS Take 400 mg by mouth every morning.   . Multiple Vitamin (MULTIVITAMIN WITH MINERALS) TABS Take 1 tablet by mouth every morning.  . NON FORMULARY 2 scoop 2 (two) times daily as needed (calm plus - when in A-fib). Reported on 05/28/2016  . NON FORMULARY Take 1,000 mg by mouth daily. Tumeric  . RESVERATROL PO Take 20 mg by mouth daily.  . vitamin C (ASCORBIC ACID) 500 MG tablet Take 1,000 mg by mouth 2 (two) times daily.   Marland Kitchen amiodarone (PACERONE) 200 MG tablet Take 1 tablet (200 mg total) by mouth as needed (for atrial fibrillaton). (Patient not taking: Reported on 05/28/2016)  . dabigatran (PRADAXA) 150 MG CAPS capsule Take 150 mg by mouth daily as needed. Reported on 05/28/2016  . furosemide (LASIX) 20 MG tablet Take 1 tablet (20 mg total) by mouth daily as needed. (Patient not taking: Reported on 06/05/2016)  . metoprolol tartrate (LOPRESSOR) 25 MG tablet Take 1 tablet (25 mg total) by mouth 2 (two) times daily as needed. (Patient not taking: Reported on 05/28/2016)   No current facility-administered medications on file prior to visit.     Review of Systems Per HPI unless specifically indicated in ROS section     Objective:    BP 130/66   Pulse 64   Temp  98 F (36.7 C) (Oral)   Wt 199 lb 12 oz (90.6 kg)   BMI 28.66 kg/m   Wt Readings from Last 3 Encounters:  06/05/16 199 lb 12 oz (90.6 kg)  05/28/16 198 lb 12 oz (90.2 kg)  03/02/16 198 lb 4 oz (89.9 kg)    Physical Exam  Constitutional: He is oriented to person, place, and time. He appears well-developed and well-nourished. No distress.  HENT:  Head: Normocephalic and atraumatic.  Right Ear: Tympanic membrane and external ear normal. Decreased hearing is noted.  Left Ear: Tympanic membrane and external ear normal. Decreased hearing is noted.  Nose: Nose normal.    Mouth/Throat: Uvula is midline, oropharynx is clear and moist and mucous membranes are normal. No oropharyngeal exudate, posterior oropharyngeal edema or posterior oropharyngeal erythema.  R cerumen impaction - unable to fully remove with plastic curette so irrigation performed L ear hard cerumen removed with plastic curette Improved hearing after cerumen removal.  Eyes: Conjunctivae and EOM are normal. Pupils are equal, round, and reactive to light. No scleral icterus.  Neck: Normal range of motion. Neck supple. Carotid bruit is not present. No thyromegaly present.  Cardiovascular: Normal rate, regular rhythm, normal heart sounds and intact distal pulses.   No murmur heard. Pulses:      Radial pulses are 2+ on the right side, and 2+ on the left side.  Pulmonary/Chest: Effort normal and breath sounds normal. No respiratory distress. He has no wheezes. He has no rales.  Abdominal: Soft. Bowel sounds are normal. He exhibits no distension and no mass. There is no tenderness. There is no rebound and no guarding.  Musculoskeletal: Normal range of motion. He exhibits no edema.  Lymphadenopathy:    He has no cervical adenopathy.  Neurological: He is alert and oriented to person, place, and time.  CN grossly intact, station and gait intact  Skin: Skin is warm and dry. No rash noted.  Psychiatric: He has a normal mood and affect. His behavior is normal. Judgment and thought content normal.  Nursing note and vitals reviewed.  Results for orders placed or performed in visit on 05/28/16  Lipid panel  Result Value Ref Range   Cholesterol 187 0 - 200 mg/dL   Triglycerides 51.0 0.0 - 149.0 mg/dL   HDL 60.60 >39.00 mg/dL   VLDL 10.2 0.0 - 40.0 mg/dL   LDL Cholesterol 116 (H) 0 - 99 mg/dL   Total CHOL/HDL Ratio 3    NonHDL 123456   Basic metabolic panel  Result Value Ref Range   Sodium 141 135 - 145 mEq/L   Potassium 4.1 3.5 - 5.1 mEq/L   Chloride 106 96 - 112 mEq/L   CO2 29 19 - 32 mEq/L    Glucose, Bld 86 70 - 99 mg/dL   BUN 19 6 - 23 mg/dL   Creatinine, Ser 1.10 0.40 - 1.50 mg/dL   Calcium 9.6 8.4 - 10.5 mg/dL   GFR 69.05 >60.00 mL/min  PSA  Result Value Ref Range   PSA 0.07 (L) 0.10 - 4.00 ng/mL  CBC with Differential/Platelet  Result Value Ref Range   WBC 4.9 4.0 - 10.5 K/uL   RBC 4.46 4.22 - 5.81 Mil/uL   Hemoglobin 14.6 13.0 - 17.0 g/dL   HCT 43.1 39.0 - 52.0 %   MCV 96.6 78.0 - 100.0 fl   MCHC 34.0 30.0 - 36.0 g/dL   RDW 14.2 11.5 - 15.5 %   Platelets 185.0 150.0 - 400.0 K/uL   Neutrophils  Relative % 61.3 43.0 - 77.0 %   Lymphocytes Relative 25.1 12.0 - 46.0 %   Monocytes Relative 9.7 3.0 - 12.0 %   Eosinophils Relative 3.0 0.0 - 5.0 %   Basophils Relative 0.9 0.0 - 3.0 %   Neutro Abs 3.0 1.4 - 7.7 K/uL   Lymphs Abs 1.2 0.7 - 4.0 K/uL   Monocytes Absolute 0.5 0.1 - 1.0 K/uL   Eosinophils Absolute 0.1 0.0 - 0.7 K/uL   Basophils Absolute 0.0 0.0 - 0.1 K/uL  VITAMIN D 25 Hydroxy (Vit-D Deficiency, Fractures)  Result Value Ref Range   VITD 43.87 30.00 - 100.00 ng/mL      Assessment & Plan:   Problem List Items Addressed This Visit    Advanced care planning/counseling discussion    Advanced directives: has at home.  Will bring me copy.  Wife would be HC proxy then daughter.       Cerumen impaction    L ear disimpaction with plastic curette performed. R ear irrigation performed.  Hearing screen passed after this.      Grief at loss of child    Discussed recent loss, offered my condolences.       Hyperlipidemia    Chronic, stable. Mild.       Paget's disease of bone - Primary    Check ALP today. No charge stick as this wasn't included in last week's labs and should have been.       Relevant Orders   Alkaline phosphatase   Paroxysmal a-fib ([place])    Appreciate cardiology care of patient.      PROSTATE CANCER    PSA stable. Continue yearly monitoring. No longer sees urology       Other Visit Diagnoses   None.      Follow up  plan: Return in about 1 year (around 06/05/2017) for medicare wellness visit.  [name], MD

## 2016-06-05 NOTE — Assessment & Plan Note (Addendum)
Chronic, stable. Mild.

## 2016-06-05 NOTE — Assessment & Plan Note (Signed)
Check ALP today. No charge stick as this wasn't included in last week's labs and should have been.

## 2016-06-07 ENCOUNTER — Other Ambulatory Visit: Payer: Self-pay | Admitting: Family Medicine

## 2016-06-07 DIAGNOSIS — M889 Osteitis deformans of unspecified bone: Secondary | ICD-10-CM

## 2016-06-17 ENCOUNTER — Encounter: Payer: Self-pay | Admitting: Family Medicine

## 2016-06-21 DIAGNOSIS — Z1211 Encounter for screening for malignant neoplasm of colon: Secondary | ICD-10-CM | POA: Diagnosis not present

## 2016-06-21 DIAGNOSIS — Z1212 Encounter for screening for malignant neoplasm of rectum: Secondary | ICD-10-CM | POA: Diagnosis not present

## 2016-06-21 LAB — COLOGUARD: Cologuard: POSITIVE

## 2016-07-02 ENCOUNTER — Telehealth: Payer: Self-pay | Admitting: Family Medicine

## 2016-07-02 DIAGNOSIS — R195 Other fecal abnormalities: Secondary | ICD-10-CM

## 2016-07-02 NOTE — Telephone Encounter (Signed)
Receive, currently in my in box.

## 2016-07-02 NOTE — Telephone Encounter (Signed)
Danielle called to make sure he received patient's Cologard results.  The patient's results were abnormal.  If you didn't, they ask to be called back and use member [phone].

## 2016-07-06 ENCOUNTER — Encounter: Payer: Self-pay | Admitting: Family Medicine

## 2016-07-06 NOTE — Telephone Encounter (Signed)
Tried to call to discuss positive cologuard (given pt had endorsed hesitance to undergo colonoscopy). No answer. Will try later.

## 2016-07-06 NOTE — Addendum Note (Signed)
Addended by: [name] on: 07/06/2016 01:35 PM   Modules accepted: Orders

## 2016-07-06 NOTE — Telephone Encounter (Signed)
Discussed results with patient. He agrees to return to Dr [name] for rpt colonoscopy. Order placed.

## 2016-07-17 DIAGNOSIS — K222 Esophageal obstruction: Secondary | ICD-10-CM | POA: Diagnosis not present

## 2016-07-17 DIAGNOSIS — Z800 Family history of malignant neoplasm of digestive organs: Secondary | ICD-10-CM | POA: Diagnosis not present

## 2016-07-17 DIAGNOSIS — I480 Paroxysmal atrial fibrillation: Secondary | ICD-10-CM | POA: Diagnosis not present

## 2016-07-17 DIAGNOSIS — K625 Hemorrhage of anus and rectum: Secondary | ICD-10-CM | POA: Diagnosis not present

## 2016-07-17 DIAGNOSIS — R195 Other fecal abnormalities: Secondary | ICD-10-CM | POA: Diagnosis not present

## 2016-08-22 DIAGNOSIS — Z85828 Personal history of other malignant neoplasm of skin: Secondary | ICD-10-CM | POA: Diagnosis not present

## 2016-08-22 DIAGNOSIS — D485 Neoplasm of uncertain behavior of skin: Secondary | ICD-10-CM | POA: Diagnosis not present

## 2016-08-22 DIAGNOSIS — L570 Actinic keratosis: Secondary | ICD-10-CM | POA: Diagnosis not present

## 2016-08-22 DIAGNOSIS — D235 Other benign neoplasm of skin of trunk: Secondary | ICD-10-CM | POA: Diagnosis not present

## 2016-08-22 DIAGNOSIS — C44519 Basal cell carcinoma of skin of other part of trunk: Secondary | ICD-10-CM | POA: Diagnosis not present

## 2016-08-22 DIAGNOSIS — C44229 Squamous cell carcinoma of skin of left ear and external auricular canal: Secondary | ICD-10-CM | POA: Diagnosis not present

## 2016-08-22 DIAGNOSIS — D225 Melanocytic nevi of trunk: Secondary | ICD-10-CM | POA: Diagnosis not present

## 2016-08-22 DIAGNOSIS — L814 Other melanin hyperpigmentation: Secondary | ICD-10-CM | POA: Diagnosis not present

## 2016-08-22 DIAGNOSIS — L821 Other seborrheic keratosis: Secondary | ICD-10-CM | POA: Diagnosis not present

## 2016-09-12 HISTORY — PX: COLONOSCOPY: SHX174

## 2016-10-01 ENCOUNTER — Encounter: Payer: Self-pay | Admitting: Family Medicine

## 2016-10-01 DIAGNOSIS — D127 Benign neoplasm of rectosigmoid junction: Secondary | ICD-10-CM | POA: Diagnosis not present

## 2016-10-01 DIAGNOSIS — K627 Radiation proctitis: Secondary | ICD-10-CM | POA: Diagnosis not present

## 2016-10-01 DIAGNOSIS — R195 Other fecal abnormalities: Secondary | ICD-10-CM | POA: Diagnosis not present

## 2016-10-01 DIAGNOSIS — C20 Malignant neoplasm of rectum: Secondary | ICD-10-CM | POA: Diagnosis not present

## 2016-10-09 DIAGNOSIS — C44519 Basal cell carcinoma of skin of other part of trunk: Secondary | ICD-10-CM | POA: Diagnosis not present

## 2016-10-09 DIAGNOSIS — L570 Actinic keratosis: Secondary | ICD-10-CM | POA: Diagnosis not present

## 2016-10-09 DIAGNOSIS — C20 Malignant neoplasm of rectum: Secondary | ICD-10-CM | POA: Diagnosis not present

## 2016-10-12 HISTORY — PX: FLEXIBLE SIGMOIDOSCOPY: SHX1649

## 2016-10-18 DIAGNOSIS — Z85038 Personal history of other malignant neoplasm of large intestine: Secondary | ICD-10-CM | POA: Diagnosis not present

## 2016-10-18 DIAGNOSIS — C187 Malignant neoplasm of sigmoid colon: Secondary | ICD-10-CM | POA: Diagnosis not present

## 2016-10-19 ENCOUNTER — Other Ambulatory Visit: Payer: Self-pay | Admitting: Gastroenterology

## 2016-10-19 DIAGNOSIS — C187 Malignant neoplasm of sigmoid colon: Secondary | ICD-10-CM

## 2016-10-24 ENCOUNTER — Ambulatory Visit
Admission: RE | Admit: 2016-10-24 | Discharge: 2016-10-24 | Disposition: A | Discharge status: Home / Self Care | Payer: Medicare Other | Source: Ambulatory Visit | Attending: Gastroenterology | Admitting: Gastroenterology

## 2016-10-24 DIAGNOSIS — C187 Malignant neoplasm of sigmoid colon: Secondary | ICD-10-CM | POA: Diagnosis not present

## 2016-10-24 MED ORDER — IOPAMIDOL (ISOVUE-300) INJECTION 61%
100.0000 mL | Freq: Once | INTRAVENOUS | Status: AC | PRN
  Administered 2016-10-24: 100 mL via INTRAVENOUS

## 2016-10-26 ENCOUNTER — Ambulatory Visit: Payer: Self-pay | Admitting: Surgery

## 2016-10-26 DIAGNOSIS — C187 Malignant neoplasm of sigmoid colon: Secondary | ICD-10-CM | POA: Diagnosis not present

## 2016-10-26 NOTE — H&P (Addendum)
[name] [name] 10/26/2016 12:06 PM Location: [facility] Patient #: [id number] DOB: 1941/01/05 Undefined / Language: Cleophus [name] / Race: White Male  History of Present Illness [name] [name] B. [name] Done MD; 10/26/2016 1:36 PM) The patient is a 75 year old male who presents with colorectal cancer. The patient is being seen for initial consultation for The patient is being seen for initial consultation for unstaged of the sigmoid colon.. The patient was referred by a gastroenterologist (Dr [name] [name]). .I have previously [name] inguinal hernias on him and I removed his wife's appendix. He is a well maintained 75 year old white male. He has had prostate cancer and there are seeds in place. I gave him a copy of his recent CT scan which showed that he had some stranding in his sigmoid colon. There was no lymphadenopathy and no evidence of metastatic disease. There was a nondescript tiny thing in his left lateral segment.  Dr [name] [name] tattooed the area where the polyp that contained cancer was removed.  This appears in the sigmoid colon.    I explained the options including observation and segmental resection with anastomosis. If this is lower it may require a colo-rectal anastomosis. The area has been tattooed by Dr. Oletta [name] recently. [name] [name] would like to wait until early January to have the surgery. We will recommend a colon bowel prep preop. I will plan a laparoscopically assisted colectomy. Colon surgery booklets were given to him.   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 He goes in and out of atrial fibrillation. He medicates himself periodically. He is followed by [name] [name] cardiology out in [name].  Plan low anterior resection of the colon after bowel prep.   Past Surgical History [name] [name], CMA; 10/26/2016 12:06 PM) Colon Polyp Removal - Colonoscopy Colon Polyp Removal - Open Foot Surgery Left. Open Inguinal Hernia Surgery  Bilateral. TURP Vasectomy  Diagnostic Studies History [name] [name], [name]; 10/26/2016 12:06 PM) Colonoscopy within last year  Allergies [name] [name], [name]; 10/26/2016 12:07 PM) No Known Drug Allergies 10/26/2016  Social History [name] [name], [name]; 10/26/2016 12:06 PM) Alcohol use Occasional alcohol use. Caffeine use Coffee. No drug use Tobacco use Never smoker.  Family History [name] [name], [name]; 10/26/2016 12:06 PM) Colon Cancer Sister. Diabetes Mellitus Mother. Prostate Cancer Brother. Respiratory Condition Daughter.  Other Problems [name] [name], [name]; 10/26/2016 12:06 PM) Atrial Fibrillation Inguinal Hernia Other disease, cancer, significant illness Prostate Cancer Ventral Hernia Repair     Review of Systems [name] [name] CMA; 10/26/2016 12:06 PM) General Not Present- Appetite Loss, Chills, Fatigue, Fever, Night Sweats, Weight Gain and Weight Loss. Skin Not Present- Change in Wart/Mole, Dryness, Hives, Jaundice, New Lesions, Non-Healing Wounds, Rash and Ulcer. HEENT Not Present- Earache, Hearing Loss, Hoarseness, Nose Bleed, Oral Ulcers, Ringing in the Ears, Seasonal Allergies, Sinus Pain, Sore Throat, Visual Disturbances, Wears glasses/contact lenses and Yellow Eyes. Respiratory Not Present- Bloody sputum, Chronic Cough, Difficulty Breathing, Snoring and Wheezing. Cardiovascular Not Present- Chest Pain, Difficulty Breathing Lying Down, Leg Cramps, Palpitations, Rapid Heart Rate, Shortness of Breath and Swelling of Extremities. Gastrointestinal Not Present- Abdominal Pain, Bloating, Bloody Stool, Change in Bowel Habits, Chronic diarrhea, Constipation, Difficulty Swallowing, Excessive gas, Gets full quickly at meals, Hemorrhoids, Indigestion, Nausea, Rectal Pain and Vomiting. Male Genitourinary Present- Frequency. Not Present- Blood in Urine, Change in Urinary Stream, Impotence, Nocturia, Painful Urination, Urgency and Urine Leakage.  Vitals [name] [name] CMA; 10/26/2016 12:09 PM) 10/26/2016 12:07 PM Weight: 200.2 lb Height: 71in Body Surface Area: 2.11 m Body Mass  Index: 27.92 kg/m  Temp.: 98.84F  Pulse: 68 (Regular)  BP: 140/72 (Sitting, Left Arm, Standard)      Physical Exam ([name] [name] B. [name] Done MD; 10/26/2016 1:37 PM)  The physical exam findings are as follows: Note:WDWNWM HEENT unremarkable Neck supple without bruits Chest clear Heart sinus rhythm without murmurs Abdomen nontender Ext FROM    Assessment & Plan [name] [name] B. 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

## 2016-10-27 ENCOUNTER — Encounter: Payer: Self-pay | Admitting: Family Medicine

## 2016-10-27 DIAGNOSIS — C189 Malignant neoplasm of colon, unspecified: Secondary | ICD-10-CM

## 2016-10-27 HISTORY — DX: Malignant neoplasm of colon, unspecified: C18.9

## 2016-11-03 ENCOUNTER — Encounter: Payer: Self-pay | Admitting: Family Medicine

## 2016-11-19 ENCOUNTER — Encounter (HOSPITAL_COMMUNITY)
Admission: RE | Admit: 2016-11-19 | Discharge: 2016-11-19 | Disposition: A | Discharge status: Home / Self Care | Payer: Medicare Other | Source: Ambulatory Visit | Attending: Surgery | Admitting: Surgery

## 2016-11-19 ENCOUNTER — Telehealth: Payer: Self-pay | Admitting: Cardiovascular Disease

## 2016-11-19 ENCOUNTER — Encounter (HOSPITAL_COMMUNITY): Payer: Self-pay

## 2016-11-19 DIAGNOSIS — Z836 Family history of other diseases of the respiratory system: Secondary | ICD-10-CM

## 2016-11-19 DIAGNOSIS — Z8042 Family history of malignant neoplasm of prostate: Secondary | ICD-10-CM

## 2016-11-19 DIAGNOSIS — Z800 Family history of malignant neoplasm of digestive organs: Secondary | ICD-10-CM

## 2016-11-19 DIAGNOSIS — I4891 Unspecified atrial fibrillation: Secondary | ICD-10-CM

## 2016-11-19 DIAGNOSIS — Z833 Family history of diabetes mellitus: Secondary | ICD-10-CM | POA: Insufficient documentation

## 2016-11-19 DIAGNOSIS — C187 Malignant neoplasm of sigmoid colon: Secondary | ICD-10-CM | POA: Insufficient documentation

## 2016-11-19 DIAGNOSIS — Z8546 Personal history of malignant neoplasm of prostate: Secondary | ICD-10-CM

## 2016-11-19 DIAGNOSIS — Z01818 Encounter for other preprocedural examination: Secondary | ICD-10-CM | POA: Insufficient documentation

## 2016-11-19 HISTORY — DX: Other complications of anesthesia, initial encounter: T88.59XA

## 2016-11-19 HISTORY — DX: Adverse effect of unspecified anesthetic, initial encounter: T41.45XA

## 2016-11-19 LAB — COMPREHENSIVE METABOLIC PANEL
ALT: 21 U/L (ref 17–63)
ANION GAP: 7 (ref 5–15)
AST: 25 U/L (ref 15–41)
Albumin: 4.6 g/dL (ref 3.5–5.0)
Alkaline Phosphatase: 145 U/L — ABNORMAL HIGH (ref 38–126)
BILIRUBIN TOTAL: 0.8 mg/dL (ref 0.3–1.2)
BUN: 22 mg/dL — ABNORMAL HIGH (ref 6–20)
CALCIUM: 9.8 mg/dL (ref 8.9–10.3)
CO2: 28 mmol/L (ref 22–32)
CREATININE: 0.9 mg/dL (ref 0.61–1.24)
Chloride: 107 mmol/L (ref 101–111)
Glucose, Bld: 92 mg/dL (ref 65–99)
Potassium: 4.7 mmol/L (ref 3.5–5.1)
Sodium: 142 mmol/L (ref 135–145)
TOTAL PROTEIN: 7.7 g/dL (ref 6.5–8.1)

## 2016-11-19 LAB — CBC WITH DIFFERENTIAL/PLATELET
Basophils Absolute: 0 10*3/uL (ref 0.0–0.1)
Basophils Relative: 1 %
EOS ABS: 0.1 10*3/uL (ref 0.0–0.7)
Eosinophils Relative: 1 %
HCT: 42.9 % (ref 39.0–52.0)
HEMOGLOBIN: 14.9 g/dL (ref 13.0–17.0)
LYMPHS ABS: 1.7 10*3/uL (ref 0.7–4.0)
LYMPHS PCT: 30 %
MCH: 33 pg (ref 26.0–34.0)
MCHC: 34.7 g/dL (ref 30.0–36.0)
MCV: 95.1 fL (ref 78.0–100.0)
MONOS PCT: 9 %
Monocytes Absolute: 0.5 10*3/uL (ref 0.1–1.0)
NEUTROS PCT: 59 %
Neutro Abs: 3.4 10*3/uL (ref 1.7–7.7)
Platelets: 166 10*3/uL (ref 150–400)
RBC: 4.51 MIL/uL (ref 4.22–5.81)
RDW: 13.7 % (ref 11.5–15.5)
WBC: 5.7 10*3/uL (ref 4.0–10.5)

## 2016-11-19 LAB — ABO/RH: ABO/RH(D): O NEG

## 2016-11-19 NOTE — Telephone Encounter (Signed)
We have not received cardiac clearance request.

## 2016-11-19 NOTE — Patient Instructions (Addendum)
[name] [name]  11/19/2016   Your procedure is scheduled on: Thursday 11/22/2016  Report to [facility] Main  Entrance take [place]  elevators to 3rd floor to  [place] at  0730 AM.  Call this number if you have problems the morning of surgery [phone]   Remember: ONLY 1 PERSON MAY GO WITH YOU TO SHORT STAY TO GET  READY MORNING OF [name] [name].             FOLLOW BOWEL PREP INSTRUCTIONS FROM DR. MARTIN'S OFFICE WITH A CLEAR LIQUID DIET ALL DAY THE DAY BEFORE SURGERY ON Wednesday 11/21/2016!              CLEAR LIQUID DIET   Foods Allowed                                                                     Foods Excluded  Coffee and tea, regular and decaf                             liquids that you cannot  Plain Jell-O in any flavor                                             see through such as: Fruit ices (not with fruit pulp)                                     milk, soups, orange juice  Iced Popsicles                                    All solid food Carbonated beverages, regular and diet                                    Cranberry, grape and apple juices Sports drinks like Gatorade Lightly seasoned clear broth or consume(fat free) Sugar, honey syrup  Sample Menu Breakfast                                Lunch                                     Supper Cranberry juice                    Beef broth                            Chicken broth Jell-O  Grape juice                           Apple juice Coffee or tea                        Jell-O                                      Popsicle                                                Coffee or tea                        Coffee or tea  _____________________________________________________________________     Do not eat food or drink liquids :After Midnight.     Take these medicines the morning of surgery with A SIP OF WATER: Amiodarone (Pacerone), Metoprolol                                  You may not have any metal on your body including hair pins and              piercings  Do not wear jewelry, make-up, lotions, powders or perfumes, deodorant             Do not wear nail polish.  Do not shave  48 hours prior to surgery.              Men may shave face and neck.   Do not bring valuables to the hospital. [name] [name].  Contacts, dentures or bridgework may not be worn into surgery.  Leave suitcase in the car. After surgery it may be brought to your room.                  Please read over the following fact sheets you were given: _____________________________________________________________________  [facility] - Preparing for Surgery Before surgery, you can play an important role.  Because skin is not sterile, your skin needs to be as free of germs as possible.  You can reduce the number of germs on your skin by washing with CHG (chlorahexidine gluconate) soap before surgery.  CHG is an antiseptic cleaner which kills germs and bonds with the skin to continue killing germs even after washing. Please DO NOT use if you have an allergy to CHG or antibacterial soaps.  If your skin becomes reddened/irritated stop using the CHG and inform your nurse when you arrive at Short Stay. Do not shave (including legs and underarms) for at least 48 hours prior to the first CHG shower.  You may shave your face/neck. Please follow these instructions carefully:  1.  Shower with CHG Soap the night before surgery and the  morning of Surgery.  2.  If you choose to wash your hair, wash your hair first as usual with your  normal  shampoo.  3.  After you shampoo, rinse your hair and body thoroughly to remove the  shampoo.  4.  Use CHG as you would any other liquid soap.  You can apply chg directly  to the skin and wash                       Gently with a scrungie or clean washcloth.  5.  Apply the  CHG Soap to your body ONLY FROM THE NECK DOWN.   Do not use on face/ open                           Wound or open sores. Avoid contact with eyes, ears mouth and genitals (private parts).                       Wash face,  Genitals (private parts) with your normal soap.             6.  Wash thoroughly, paying special attention to the area where your surgery  will be performed.  7.  Thoroughly rinse your body with warm water from the neck down.  8.  DO NOT shower/wash with your normal soap after using and rinsing off  the CHG Soap.                9.  Pat yourself dry with a clean towel.            10.  Wear clean pajamas.            11.  Place clean sheets on your bed the night of your first shower and do not  sleep with pets. Day of Surgery : Do not apply any lotions/deodorants the morning of surgery.  Please wear clean clothes to the hospital/surgery center.  FAILURE TO FOLLOW THESE INSTRUCTIONS MAY RESULT IN THE CANCELLATION OF YOUR SURGERY PATIENT SIGNATURE_________________________________  NURSE SIGNATURE__________________________________  ________________________________________________________________________

## 2016-11-19 NOTE — Telephone Encounter (Signed)
Nurse with [facility] pre admit needs to discuss clearance for this pt for surgery on 1/11. Sigmoid colectomy. Please call.

## 2016-11-19 NOTE — Progress Notes (Signed)
Consulted Anesthesia, Dr. Tobias Alexander for Anesthesia consult from Dr. Hassell Done and per Dr. Tobias Alexander , he would like me to contact Dr. Rockey Situ and ask if there is anything else he needs done pre-op for this surgery  To monitor him effectively for his surgery!

## 2016-11-20 LAB — CEA: CEA: 4.1 ng/mL (ref 0.0–4.7)

## 2016-11-20 LAB — HEMOGLOBIN A1C
Hgb A1c MFr Bld: 5.5 % (ref 4.8–5.6)
MEAN PLASMA GLUCOSE: 111 mg/dL

## 2016-11-22 ENCOUNTER — Inpatient Hospital Stay (HOSPITAL_COMMUNITY)
Admission: RE | Admit: 2016-11-22 | Discharge: 2016-11-27 | DRG: 331 | Disposition: A | Discharge status: Home / Self Care | Payer: Medicare Other | Source: Ambulatory Visit | Attending: Surgery | Admitting: Surgery

## 2016-11-22 ENCOUNTER — Encounter (HOSPITAL_COMMUNITY)
Admission: RE | Disposition: A | Discharge status: Home / Self Care | Payer: Self-pay | Source: Ambulatory Visit | Attending: Surgery

## 2016-11-22 ENCOUNTER — Inpatient Hospital Stay (HOSPITAL_COMMUNITY): Payer: Medicare Other | Admitting: Anesthesiology

## 2016-11-22 ENCOUNTER — Encounter (HOSPITAL_COMMUNITY): Payer: Self-pay | Admitting: *Deleted

## 2016-11-22 DIAGNOSIS — E785 Hyperlipidemia, unspecified: Secondary | ICD-10-CM | POA: Diagnosis present

## 2016-11-22 DIAGNOSIS — K5730 Diverticulosis of large intestine without perforation or abscess without bleeding: Secondary | ICD-10-CM | POA: Diagnosis not present

## 2016-11-22 DIAGNOSIS — Z79899 Other long term (current) drug therapy: Secondary | ICD-10-CM

## 2016-11-22 DIAGNOSIS — E559 Vitamin D deficiency, unspecified: Secondary | ICD-10-CM | POA: Diagnosis not present

## 2016-11-22 DIAGNOSIS — C187 Malignant neoplasm of sigmoid colon: Secondary | ICD-10-CM | POA: Diagnosis not present

## 2016-11-22 DIAGNOSIS — I509 Heart failure, unspecified: Secondary | ICD-10-CM | POA: Diagnosis present

## 2016-11-22 DIAGNOSIS — I480 Paroxysmal atrial fibrillation: Secondary | ICD-10-CM | POA: Diagnosis not present

## 2016-11-22 DIAGNOSIS — C189 Malignant neoplasm of colon, unspecified: Secondary | ICD-10-CM | POA: Diagnosis not present

## 2016-11-22 DIAGNOSIS — Z8042 Family history of malignant neoplasm of prostate: Secondary | ICD-10-CM | POA: Diagnosis not present

## 2016-11-22 DIAGNOSIS — Z7901 Long term (current) use of anticoagulants: Secondary | ICD-10-CM | POA: Diagnosis not present

## 2016-11-22 DIAGNOSIS — C61 Malignant neoplasm of prostate: Secondary | ICD-10-CM | POA: Diagnosis not present

## 2016-11-22 DIAGNOSIS — Z833 Family history of diabetes mellitus: Secondary | ICD-10-CM | POA: Diagnosis not present

## 2016-11-22 DIAGNOSIS — Z800 Family history of malignant neoplasm of digestive organs: Secondary | ICD-10-CM | POA: Diagnosis not present

## 2016-11-22 DIAGNOSIS — K635 Polyp of colon: Secondary | ICD-10-CM | POA: Diagnosis not present

## 2016-11-22 DIAGNOSIS — M889 Osteitis deformans of unspecified bone: Secondary | ICD-10-CM | POA: Diagnosis not present

## 2016-11-22 DIAGNOSIS — Z9049 Acquired absence of other specified parts of digestive tract: Secondary | ICD-10-CM

## 2016-11-22 DIAGNOSIS — D125 Benign neoplasm of sigmoid colon: Secondary | ICD-10-CM

## 2016-11-22 HISTORY — DX: Impacted cerumen, unspecified ear: H61.20

## 2016-11-22 HISTORY — PX: LAPAROSCOPIC SIGMOID COLECTOMY: SHX5928

## 2016-11-22 LAB — TYPE AND SCREEN
ABO/RH(D): O NEG
ANTIBODY SCREEN: NEGATIVE

## 2016-11-22 SURGERY — COLECTOMY, SIGMOID, LAPAROSCOPIC
Anesthesia: General

## 2016-11-22 MED ORDER — ROCURONIUM BROMIDE 50 MG/5ML IV SOSY
PREFILLED_SYRINGE | INTRAVENOUS | Status: AC
  Filled 2016-11-22: qty 5

## 2016-11-22 MED ORDER — ALVIMOPAN 12 MG PO CAPS
12.0000 mg | ORAL_CAPSULE | Freq: Two times a day (BID) | ORAL | Status: DC
  Administered 2016-11-23 – 2016-11-24 (×3): 12 mg via ORAL
  Filled 2016-11-22 (×4): qty 1

## 2016-11-22 MED ORDER — DEXTROSE 5 % IV SOLN
2.0000 g | Freq: Two times a day (BID) | INTRAVENOUS | Status: AC
  Administered 2016-11-22: 22:00:00 2 g via INTRAVENOUS
  Filled 2016-11-22: qty 2

## 2016-11-22 MED ORDER — ALVIMOPAN 12 MG PO CAPS
12.0000 mg | ORAL_CAPSULE | Freq: Once | ORAL | Status: AC
  Administered 2016-11-22: 12 mg via ORAL
  Filled 2016-11-22: qty 1

## 2016-11-22 MED ORDER — GABAPENTIN 300 MG PO CAPS
300.0000 mg | ORAL_CAPSULE | ORAL | Status: AC
  Administered 2016-11-22: 300 mg via ORAL
  Filled 2016-11-22: qty 1

## 2016-11-22 MED ORDER — BUPIVACAINE LIPOSOME 1.3 % IJ SUSP
INTRAMUSCULAR | Status: DC | PRN
  Administered 2016-11-22: 20 mL

## 2016-11-22 MED ORDER — PROPOFOL 10 MG/ML IV BOLUS
INTRAVENOUS | Status: DC | PRN
  Administered 2016-11-22: 150 mg via INTRAVENOUS

## 2016-11-22 MED ORDER — CELECOXIB 200 MG PO CAPS
400.0000 mg | ORAL_CAPSULE | ORAL | Status: AC
  Administered 2016-11-22: 400 mg via ORAL
  Filled 2016-11-22: qty 2

## 2016-11-22 MED ORDER — CHLORHEXIDINE GLUCONATE CLOTH 2 % EX PADS: 6.0000 | MEDICATED_PAD | Freq: Once | CUTANEOUS | Status: DC

## 2016-11-22 MED ORDER — SUFENTANIL CITRATE 50 MCG/ML IV SOLN
INTRAVENOUS | Status: AC
  Filled 2016-11-22: qty 1

## 2016-11-22 MED ORDER — ONDANSETRON HCL 4 MG/2ML IJ SOLN
INTRAMUSCULAR | Status: AC
  Filled 2016-11-22: qty 2

## 2016-11-22 MED ORDER — CEFOTETAN DISODIUM-DEXTROSE 2-2.08 GM-% IV SOLR
INTRAVENOUS | Status: AC
  Filled 2016-11-22: qty 50

## 2016-11-22 MED ORDER — HEPARIN SODIUM (PORCINE) 5000 UNIT/ML IJ SOLN: 5000.0000 [IU] | Freq: Three times a day (TID) | INTRAMUSCULAR | Status: DC

## 2016-11-22 MED ORDER — SUGAMMADEX SODIUM 200 MG/2ML IV SOLN
INTRAVENOUS | Status: DC | PRN
  Administered 2016-11-22: 200 mg via INTRAVENOUS

## 2016-11-22 MED ORDER — PROPOFOL 10 MG/ML IV BOLUS
INTRAVENOUS | Status: AC
  Filled 2016-11-22: qty 20

## 2016-11-22 MED ORDER — PHENYLEPHRINE HCL 10 MG/ML IJ SOLN
INTRAMUSCULAR | Status: AC
  Filled 2016-11-22: qty 1

## 2016-11-22 MED ORDER — POLYETHYLENE GLYCOL 3350 17 GM/SCOOP PO POWD
1.0000 | Freq: Once | ORAL | Status: DC
  Filled 2016-11-22: qty 255

## 2016-11-22 MED ORDER — METRONIDAZOLE 500 MG PO TABS
1000.0000 mg | ORAL_TABLET | ORAL | Status: DC
  Filled 2016-11-22: qty 2

## 2016-11-22 MED ORDER — HYDROMORPHONE HCL 1 MG/ML IJ SOLN
INTRAMUSCULAR | Status: AC
  Administered 2016-11-22: 0.25 mg via INTRAVENOUS
  Filled 2016-11-22: qty 1

## 2016-11-22 MED ORDER — ACETAMINOPHEN 500 MG PO TABS
1000.0000 mg | ORAL_TABLET | ORAL | Status: AC
  Administered 2016-11-22: 1000 mg via ORAL
  Filled 2016-11-22: qty 2

## 2016-11-22 MED ORDER — HEPARIN SODIUM (PORCINE) 5000 UNIT/ML IJ SOLN
5000.0000 [IU] | Freq: Once | INTRAMUSCULAR | Status: AC
  Administered 2016-11-22: 5000 [IU] via SUBCUTANEOUS
  Filled 2016-11-22: qty 1

## 2016-11-22 MED ORDER — DEXAMETHASONE SODIUM PHOSPHATE 10 MG/ML IJ SOLN
INTRAMUSCULAR | Status: AC
  Filled 2016-11-22: qty 1

## 2016-11-22 MED ORDER — OXYCODONE HCL 5 MG PO TABS: 5.0000 mg | ORAL_TABLET | Freq: Once | ORAL | Status: DC | PRN

## 2016-11-22 MED ORDER — ONDANSETRON HCL 4 MG/2ML IJ SOLN: 4.0000 mg | Freq: Four times a day (QID) | INTRAMUSCULAR | Status: DC | PRN

## 2016-11-22 MED ORDER — SUCCINYLCHOLINE CHLORIDE 200 MG/10ML IV SOSY
PREFILLED_SYRINGE | INTRAVENOUS | Status: AC
  Filled 2016-11-22: qty 10

## 2016-11-22 MED ORDER — SUFENTANIL CITRATE 50 MCG/ML IV SOLN
INTRAVENOUS | Status: DC | PRN
  Administered 2016-11-22 (×3): 10 ug via INTRAVENOUS
  Administered 2016-11-22: 20 ug via INTRAVENOUS

## 2016-11-22 MED ORDER — 0.9 % SODIUM CHLORIDE (POUR BTL) OPTIME
TOPICAL | Status: DC | PRN
  Administered 2016-11-22: 4000 mL

## 2016-11-22 MED ORDER — LACTATED RINGERS IR SOLN
Status: DC | PRN
  Administered 2016-11-22: 1000 mL

## 2016-11-22 MED ORDER — CEFOTETAN DISODIUM 2 G IJ SOLR
2.0000 g | INTRAMUSCULAR | Status: AC
  Administered 2016-11-22: 2 g via INTRAVENOUS
  Filled 2016-11-22: qty 2

## 2016-11-22 MED ORDER — HYDROMORPHONE HCL 1 MG/ML IJ SOLN
0.2500 mg | INTRAMUSCULAR | Status: DC | PRN
  Administered 2016-11-22 (×2): 0.25 mg via INTRAVENOUS

## 2016-11-22 MED ORDER — SODIUM CHLORIDE 0.9 % IJ SOLN
INTRAMUSCULAR | Status: AC
  Filled 2016-11-22: qty 10

## 2016-11-22 MED ORDER — SODIUM CHLORIDE 0.9 % IJ SOLN
INTRAMUSCULAR | Status: DC | PRN
  Administered 2016-11-22: 10 mL

## 2016-11-22 MED ORDER — ROCURONIUM BROMIDE 10 MG/ML (PF) SYRINGE
PREFILLED_SYRINGE | INTRAVENOUS | Status: DC | PRN
  Administered 2016-11-22: 50 mg via INTRAVENOUS
  Administered 2016-11-22: 20 mg via INTRAVENOUS
  Administered 2016-11-22: 10 mg via INTRAVENOUS
  Administered 2016-11-22: 5 mg via INTRAVENOUS

## 2016-11-22 MED ORDER — LACTATED RINGERS IV SOLN
INTRAVENOUS | Status: DC
  Administered 2016-11-22 (×2): via INTRAVENOUS

## 2016-11-22 MED ORDER — LABETALOL HCL 5 MG/ML IV SOLN
INTRAVENOUS | Status: AC
  Filled 2016-11-22: qty 4

## 2016-11-22 MED ORDER — NEOMYCIN SULFATE 500 MG PO TABS
1000.0000 mg | ORAL_TABLET | ORAL | Status: DC
  Filled 2016-11-22: qty 2

## 2016-11-22 MED ORDER — PHENYLEPHRINE HCL 10 MG/ML IJ SOLN
INTRAMUSCULAR | Status: DC | PRN
  Administered 2016-11-22: 50 ug/min via INTRAVENOUS

## 2016-11-22 MED ORDER — EPHEDRINE SULFATE-NACL 50-0.9 MG/10ML-% IV SOSY
PREFILLED_SYRINGE | INTRAVENOUS | Status: DC | PRN
  Administered 2016-11-22 (×2): 10 mg via INTRAVENOUS

## 2016-11-22 MED ORDER — LIDOCAINE 2% (20 MG/ML) 5 ML SYRINGE
INTRAMUSCULAR | Status: AC
  Filled 2016-11-22: qty 5

## 2016-11-22 MED ORDER — DEXAMETHASONE SODIUM PHOSPHATE 10 MG/ML IJ SOLN
INTRAMUSCULAR | Status: DC | PRN
  Administered 2016-11-22: 10 mg via INTRAVENOUS

## 2016-11-22 MED ORDER — OXYCODONE HCL 5 MG/5ML PO SOLN
5.0000 mg | Freq: Once | ORAL | Status: DC | PRN
  Filled 2016-11-22: qty 5

## 2016-11-22 MED ORDER — BUPIVACAINE LIPOSOME 1.3 % IJ SUSP
20.0000 mL | Freq: Once | INTRAMUSCULAR | Status: DC
  Filled 2016-11-22 (×2): qty 20

## 2016-11-22 MED ORDER — ONDANSETRON HCL 4 MG/2ML IJ SOLN
INTRAMUSCULAR | Status: DC | PRN
  Administered 2016-11-22: 4 mg via INTRAVENOUS

## 2016-11-22 MED ORDER — KCL IN DEXTROSE-NACL 20-5-0.45 MEQ/L-%-% IV SOLN
INTRAVENOUS | Status: DC
  Administered 2016-11-22 – 2016-11-23 (×2): via INTRAVENOUS
  Filled 2016-11-22 (×6): qty 1000

## 2016-11-22 MED ORDER — HYDROCODONE-ACETAMINOPHEN 5-325 MG PO TABS
1.0000 | ORAL_TABLET | ORAL | Status: DC | PRN
  Administered 2016-11-23 – 2016-11-27 (×5): 1 via ORAL
  Filled 2016-11-22 (×6): qty 1

## 2016-11-22 MED ORDER — MORPHINE SULFATE (PF) 2 MG/ML IV SOLN: 1.0000 mg | INTRAVENOUS | Status: DC | PRN

## 2016-11-22 MED ORDER — SUGAMMADEX SODIUM 200 MG/2ML IV SOLN
INTRAVENOUS | Status: AC
  Filled 2016-11-22: qty 2

## 2016-11-22 SURGICAL SUPPLY — 68 items
APPLIER CLIP 5 13 M/L LIGAMAX5 (MISCELLANEOUS)
APPLIER CLIP ROT 10 11.4 M/L (STAPLE)
APR CLP MED LRG 11.4X10 (STAPLE)
APR CLP MED LRG 5 ANG JAW (MISCELLANEOUS)
BLADE EXTENDED COATED 6.5IN (ELECTRODE) IMPLANT
CABLE HIGH FREQUENCY MONO STRZ (ELECTRODE) ×3 IMPLANT
CELLS DAT CNTRL 66122 CELL SVR (MISCELLANEOUS) IMPLANT
CHLORAPREP W/TINT 26ML (MISCELLANEOUS) ×3 IMPLANT
CLIP APPLIE 5 13 M/L LIGAMAX5 (MISCELLANEOUS) IMPLANT
CLIP APPLIE ROT 10 11.4 M/L (STAPLE) IMPLANT
COUNTER NEEDLE 20 DBL MAG RED (NEEDLE) ×3 IMPLANT
COVER MAYO STAND STRL (DRAPES) ×9 IMPLANT
COVER SURGICAL LIGHT HANDLE (MISCELLANEOUS) ×6 IMPLANT
DECANTER SPIKE VIAL GLASS SM (MISCELLANEOUS) ×3 IMPLANT
DRAIN CHANNEL 19F RND (DRAIN) ×2 IMPLANT
DRAPE LAPAROSCOPIC ABDOMINAL (DRAPES) ×3 IMPLANT
DRSG OPSITE POSTOP 4X10 (GAUZE/BANDAGES/DRESSINGS) IMPLANT
DRSG OPSITE POSTOP 4X6 (GAUZE/BANDAGES/DRESSINGS) IMPLANT
DRSG OPSITE POSTOP 4X8 (GAUZE/BANDAGES/DRESSINGS) IMPLANT
ELECT REM PT RETURN 15FT ADLT (MISCELLANEOUS) ×3 IMPLANT
EVACUATOR SILICONE 100CC (DRAIN) ×3 IMPLANT
GAUZE SPONGE 2X2 8PLY STRL LF (GAUZE/BANDAGES/DRESSINGS) IMPLANT
GAUZE SPONGE 4X4 12PLY STRL (GAUZE/BANDAGES/DRESSINGS) IMPLANT
GLOVE BIOGEL M 8.0 STRL (GLOVE) ×6 IMPLANT
GOWN STRL REUS W/TWL XL LVL3 (GOWN DISPOSABLE) ×18 IMPLANT
HANDLE STAPLE EGIA 4 XL (STAPLE) ×2 IMPLANT
LEGGING LITHOTOMY PAIR STRL (DRAPES) ×3 IMPLANT
LIGASURE IMPACT 36 18CM CVD LR (INSTRUMENTS) ×2 IMPLANT
PACK COLON (CUSTOM PROCEDURE TRAY) ×3 IMPLANT
PAD POSITIONING PINK XL (MISCELLANEOUS) ×3 IMPLANT
PORT LAP GEL ALEXIS MED 5-9CM (MISCELLANEOUS) IMPLANT
POSITIONER SURGICAL ARM (MISCELLANEOUS) ×6 IMPLANT
RELOAD STAPLE 60 BLK XTHK ART (STAPLE) IMPLANT
RELOAD TRI 2.0 60 XTHK VAS SUL (STAPLE) ×9 IMPLANT
RETRACTOR WND ALEXIS 18 MED (MISCELLANEOUS) IMPLANT
RTRCTR WOUND ALEXIS 18CM MED (MISCELLANEOUS)
SCISSORS LAP 5X45 EPIX DISP (ENDOMECHANICALS) ×3 IMPLANT
SEALER TISSUE X1 CVD JAW (INSTRUMENTS) IMPLANT
SET IRRIG TUBING LAPAROSCOPIC (IRRIGATION / IRRIGATOR) ×4 IMPLANT
SHEARS CURVED HARMONIC AC 45CM (MISCELLANEOUS) ×3 IMPLANT
SLEEVE XCEL OPT CAN 5 100 (ENDOMECHANICALS) ×9 IMPLANT
SPONGE GAUZE 2X2 STER 10/PKG (GAUZE/BANDAGES/DRESSINGS) ×2
SPONGE LAP 18X18 X RAY DECT (DISPOSABLE) ×2 IMPLANT
STAPLER CIRC CVD 29MM 37CM (STAPLE) ×2 IMPLANT
STAPLER VISISTAT 35W (STAPLE) ×3 IMPLANT
SUT CHROMIC 3 0 SH 27 (SUTURE) IMPLANT
SUT ETHILON 2 0 PS N (SUTURE) ×3 IMPLANT
SUT PDS AB 1 CTX 36 (SUTURE) IMPLANT
SUT PDS AB 1 TP1 96 (SUTURE) ×6 IMPLANT
SUT PDS AB 4-0 SH 27 (SUTURE) IMPLANT
SUT PROLENE 2 0 KS (SUTURE) IMPLANT
SUT SILK 2 0 (SUTURE) ×3
SUT SILK 2 0 SH CR/8 (SUTURE) ×3 IMPLANT
SUT SILK 2-0 18XBRD TIE 12 (SUTURE) ×1 IMPLANT
SUT SILK 3 0 (SUTURE) ×3
SUT SILK 3 0 SH CR/8 (SUTURE) ×3 IMPLANT
SUT SILK 3-0 18XBRD TIE 12 (SUTURE) ×1 IMPLANT
SUT VIC AB 4-0 SH 18 (SUTURE) ×3 IMPLANT
SYS LAPSCP GELPORT 120MM (MISCELLANEOUS) ×3
SYSTEM LAPSCP GELPORT 120MM (MISCELLANEOUS) IMPLANT
TAPE CLOTH SOFT 2X10 (GAUZE/BANDAGES/DRESSINGS) ×2 IMPLANT
TOWEL OR NON WOVEN STRL DISP B (DISPOSABLE) ×3 IMPLANT
TRAY FOLEY W/METER SILVER 16FR (SET/KITS/TRAYS/PACK) IMPLANT
TROCAR BLADELESS OPT 5 100 (ENDOMECHANICALS) ×3 IMPLANT
TROCAR XCEL NON-BLD 11X100MML (ENDOMECHANICALS) IMPLANT
TUBING CONNECTING 10 (TUBING) IMPLANT
TUBING CONNECTING 10' (TUBING)
TUBING INSUF HEATED (TUBING) ×5 IMPLANT

## 2016-11-22 NOTE — Interval H&P Note (Signed)
History and Physical Interval Note:  11/22/2016 9:15 AM  [name] [name]  has presented today for surgery, with the diagnosis of CANCER IN POLYP  The various methods of treatment have been discussed with the patient and family. After consideration of risks, benefits and other options for treatment, the patient has consented to  Procedure(s): LAPAROSCOPIC ASSISTED SIGMOID COLECTOMY (N/A) as a surgical intervention .  The patient's history has been reviewed, patient examined, no change in status, stable for surgery.  I have reviewed the patient's chart and labs.  Questions were answered to the patient's satisfaction.     [name]

## 2016-11-22 NOTE — Transfer of Care (Signed)
Immediate Anesthesia Transfer of Care Note  Patient: [name] [name]  Procedure(s) Performed: Procedure(s): LAPAROSCOPIC ASSISTED SIGMOID COLECTOMY (N/A)  Patient Location: PACU  Anesthesia Type:General  Level of Consciousness: awake and oriented  Airway & Oxygen Therapy: Patient Spontanous Breathing and Patient connected to face mask oxygen  Post-op Assessment: Report given to RN and Post -op Vital signs reviewed and stable  Post vital signs: Reviewed and stable  Last Vitals:  Vitals:   11/22/16 0735  BP: (!) 147/75  Pulse: 60  Resp: 16  Temp: 36.6 C    Last Pain:  Vitals:   11/22/16 0735  TempSrc: Oral      Patients Stated Pain Goal: 4 ([phone])  Complications: No apparent anesthesia complications

## 2016-11-22 NOTE — Op Note (Signed)
Surgeon: Kaylyn Lim, MD, FACS  Asst:  Leighton Ruff, MD, FACS  Anes:  general  Preop Dx: Colon cancer in polyp, tatooed by Dr. Leonie Douglas Postop Dx: same  Procedure: Lap assisted sigmoid colectomy with 29 EEA anastomosis Location Surgery: [facility] Complications: None noted  EBL:   30 cc  Drains: 19 blake drain in the pelvis  Description of Procedure:  The patient was taken to OR 4 .  After anesthesia was administered and the patient was prepped a timeout was performed.  The patient was in the yellow fin stirrups, padded, and draped.  Access to the abdomen with 5 mm Optiview through the right upper quadrant and then three other 5 mm trocars were placed in the right lower quadrant and along the left side.  With the Harmonic scalpel, we mobilized the left colon and the sigmoid colon.  In steep [place] we mobilized a very stuck segment of small bowel that was lying to the right of the sigmoid in the pelvis.  The tatto could then be seen.    A lower midline incision was made incorporating the midline 5 mm trocar.  The colon was brought out with hand port protector on the fascia.  The bowel was divided proximally with the Tristaple endo with a black load 6 cm Covidien.  The mesentery was divided with the Ligasure.  The distal end was well below the tattoo.  The specimen was opened and tattoo revealed.    The anastomosis was performed with 29 EEA Stealth.  The anvil was placed in the proximal end with the green tip and the end restapled.  I went below and introduced the stapler and opened the spike;  Coupling occurred and the EEA was brought in to the mid green zone.  Following stapling, two complete rings were seen.  Dr. Marcello Moores initially saw some bubbles in the pelvis but after several different manipulations and multiple distentions no bubbles were see and a good looking anasatomosis was seen.  The anastomosis appeared complete in through the rigid scope-did not traverse the anastomosis but  inflated several times.  19 blake drain was brought out through the left lower quadrant.  The incision was closed with double stranded PDS.  Repeat endoscopy to look at pelvis and closure and all appeared OK.  Wounds injected with Exparel (30 cc) and closed with staples.    The patient tolerated the procedure well and was taken to the PACU in stable condition.     Matt B. Hassell Done, [place], [facility], [place]

## 2016-11-22 NOTE — Progress Notes (Signed)
Pt's JP drain output 345 cc between 1535 and 1835. Text page sent to on call surgeon Dr. Harlow Asa.

## 2016-11-22 NOTE — Anesthesia Procedure Notes (Signed)
Procedure Name: Intubation Date/Time: 11/22/2016 9:54 AM Performed by: Danley Danker L Patient Re-evaluated:Patient Re-evaluated prior to inductionOxygen Delivery Method: Circle system utilized Preoxygenation: Pre-oxygenation with 100% oxygen Intubation Type: IV induction Ventilation: Mask ventilation without difficulty and Oral airway inserted - appropriate to patient size Laryngoscope Size: Miller and 3 Grade View: Grade I Tube type: Oral Tube size: 8.0 mm Number of attempts: 1 Airway Equipment and Method: Stylet Placement Confirmation: ETT inserted through vocal cords under direct vision,  positive ETCO2 and breath sounds checked- equal and bilateral Secured at: 22 cm Tube secured with: Tape Dental Injury: Teeth and Oropharynx as per pre-operative assessment

## 2016-11-22 NOTE — Progress Notes (Signed)
Order received from Dr. Harlow Asa to hold heparin tonight until am lab results can be reviewed.

## 2016-11-22 NOTE — Anesthesia Preprocedure Evaluation (Signed)
Anesthesia Evaluation  Patient identified by MRN, date of birth, ID band Patient awake    Reviewed: Allergy & Precautions, H&P , NPO status , Patient's Chart, lab work & pertinent test results  Airway Mallampati: II   Neck ROM: full    Dental   Pulmonary former smoker,    breath sounds clear to auscultation       Cardiovascular +CHF  + dysrhythmias Atrial Fibrillation  Rhythm:regular Rate:Normal  TTE (2007): EF 60%. Normal valves.   Neuro/Psych    GI/Hepatic PUD, Colon CA   Endo/Other    Renal/GU      Musculoskeletal  (+) Arthritis ,   Abdominal   Peds  Hematology   Anesthesia Other Findings   Reproductive/Obstetrics                             Anesthesia Physical Anesthesia Plan  ASA: III  Anesthesia Plan: General   Post-op Pain Management:    Induction: Intravenous  Airway Management Planned: Oral ETT  Additional Equipment:   Intra-op Plan:   Post-operative Plan: Extubation in OR  Informed Consent: I have reviewed the patients History and Physical, chart, labs and discussed the procedure including the risks, benefits and alternatives for the proposed anesthesia with the patient or authorized representative who has indicated his/her understanding and acceptance.     Plan Discussed with: CRNA, Anesthesiologist and Surgeon  Anesthesia Plan Comments:         Anesthesia Quick Evaluation

## 2016-11-22 NOTE — Anesthesia Postprocedure Evaluation (Signed)
Anesthesia Post Note  Patient: [name] [name]  Procedure(s) Performed: Procedure(s) (LRB): LAPAROSCOPIC ASSISTED SIGMOID COLECTOMY (N/A)  Patient location during evaluation: PACU Anesthesia Type: General Level of consciousness: awake and alert and patient cooperative Pain management: pain level controlled Vital Signs Assessment: post-procedure vital signs reviewed and stable Respiratory status: spontaneous breathing and respiratory function stable Cardiovascular status: stable Anesthetic complications: no       Last Vitals:  Vitals:   11/22/16 1345 11/22/16 1400  BP: (!) 143/73 (!) 150/66  Pulse: 71 72  Resp: (!) 9 12  Temp:      Last Pain:  Vitals:   11/22/16 1330  TempSrc:   PainSc: [place]

## 2016-11-22 NOTE — H&P (View-Only) (Signed)
[name] [name] 10/26/2016 12:06 PM Location: [facility] Patient #: [id number] DOB: June 18, 1941 Undefined / Language: [name] [name] / Race: White Male  History of Present Illness [name] Key B. Hassell Done MD; 10/26/2016 1:36 PM) The patient is a 76 year old male who presents with colorectal cancer. The patient is being seen for initial consultation for The patient is being seen for initial consultation for unstaged of the sigmoid colon.. The patient was referred by a gastroenterologist (Dr [name]). .I have previously [name] inguinal hernias on him and I removed his wife's appendix. He is a well maintained 76 year old white male. He has had prostate cancer and there are seeds in place. I gave him a copy of his recent CT scan which showed that he had some stranding in his sigmoid colon. There was no lymphadenopathy and no evidence of metastatic disease. There was a nondescript tiny thing in his left lateral segment.  Dr [name] tattooed the area where the polyp that contained cancer was removed.  This appears in the sigmoid colon.    I explained the options including observation and segmental resection with anastomosis. If this is lower it may require a colo-rectal anastomosis. The area has been tattooed by Dr. [name] recently. [name] would like to wait until early January to have the surgery. We will recommend a colon bowel prep preop. I will plan a laparoscopically assisted colectomy. Colon surgery booklets were given to him.   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 He goes in and out of atrial fibrillation. He medicates himself periodically. He is followed by [facility] cardiology out in [place].  Plan low anterior resection of the colon after bowel prep.   Past Surgical History [name], CMA; 10/26/2016 12:06 PM) Colon Polyp Removal - Colonoscopy Colon Polyp Removal - Open Foot Surgery Left. Open Inguinal Hernia Surgery  Bilateral. TURP Vasectomy  Diagnostic Studies History [name], [place]; 10/26/2016 12:06 PM) Colonoscopy within last year  Allergies [name], [place]; 10/26/2016 12:07 PM) No Known Drug Allergies 10/26/2016  Social History [name], [place]; 10/26/2016 12:06 PM) Alcohol use Occasional alcohol use. Caffeine use Coffee. No drug use Tobacco use Never smoker.  Family History [name], [place]; 10/26/2016 12:06 PM) Colon Cancer Sister. Diabetes Mellitus Mother. Prostate Cancer Brother. Respiratory Condition Daughter.  Other Problems [name], [place]; 10/26/2016 12:06 PM) Atrial Fibrillation Inguinal Hernia Other disease, cancer, significant illness Prostate Cancer Ventral Hernia Repair     Review of Systems [name] CMA; 10/26/2016 12:06 PM) General Not Present- Appetite Loss, Chills, Fatigue, Fever, Night Sweats, Weight Gain and Weight Loss. Skin Not Present- Change in Wart/Mole, Dryness, Hives, Jaundice, New Lesions, Non-Healing Wounds, Rash and Ulcer. HEENT Not Present- Earache, Hearing Loss, Hoarseness, Nose Bleed, Oral Ulcers, Ringing in the Ears, Seasonal Allergies, Sinus Pain, Sore Throat, Visual Disturbances, Wears glasses/contact lenses and Yellow Eyes. Respiratory Not Present- Bloody sputum, Chronic Cough, Difficulty Breathing, Snoring and Wheezing. Cardiovascular Not Present- Chest Pain, Difficulty Breathing Lying Down, Leg Cramps, Palpitations, Rapid Heart Rate, Shortness of Breath and Swelling of Extremities. Gastrointestinal Not Present- Abdominal Pain, Bloating, Bloody Stool, Change in Bowel Habits, Chronic diarrhea, Constipation, Difficulty Swallowing, Excessive gas, Gets full quickly at meals, Hemorrhoids, Indigestion, Nausea, Rectal Pain and Vomiting. Male Genitourinary Present- Frequency. Not Present- Blood in Urine, Change in Urinary Stream, Impotence, Nocturia, Painful Urination, Urgency and Urine Leakage.  Vitals [name] CMA; 10/26/2016 12:09 PM) 10/26/2016 12:07 PM Weight: 200.2 lb Height: 71in Body Surface Area: 2.11 m Body Mass  Index: 27.92 kg/m  Temp.: 98.80F  Pulse: 68 (Regular)  BP: 140/72 (Sitting, Left Arm, Standard)      Physical Exam (Kirill Chatterjee B. Hassell Done MD; 10/26/2016 1:37 PM)  The physical exam findings are as follows: Note:WDWNWM HEENT unremarkable Neck supple without bruits Chest clear Heart sinus rhythm without murmurs Abdomen nontender Ext FROM    Assessment & Plan [name] Key B. Hassell Done MD; 10/26/2016 1:38 PM)  CANCER OF SIGMOID COLON (C18.7) Impression: schedule lap assisted sigmoid colectomy at [facility]

## 2016-11-23 LAB — CBC
HCT: 35.7 % — ABNORMAL LOW (ref 39.0–52.0)
Hemoglobin: 12.3 g/dL — ABNORMAL LOW (ref 13.0–17.0)
MCH: 31.5 pg (ref 26.0–34.0)
MCHC: 34.5 g/dL (ref 30.0–36.0)
MCV: 91.3 fL (ref 78.0–100.0)
PLATELETS: 168 10*3/uL (ref 150–400)
RBC: 3.91 MIL/uL — AB (ref 4.22–5.81)
RDW: 13.6 % (ref 11.5–15.5)
WBC: 10.8 10*3/uL — ABNORMAL HIGH (ref 4.0–10.5)

## 2016-11-23 LAB — BASIC METABOLIC PANEL
Anion gap: 7 (ref 5–15)
BUN: 24 mg/dL — ABNORMAL HIGH (ref 6–20)
CALCIUM: 8.5 mg/dL — AB (ref 8.9–10.3)
CHLORIDE: 109 mmol/L (ref 101–111)
CO2: 24 mmol/L (ref 22–32)
CREATININE: 1.19 mg/dL (ref 0.61–1.24)
GFR calc non Af Amer: 57 mL/min — ABNORMAL LOW (ref >60)
GLUCOSE: 158 mg/dL — AB (ref 65–99)
Potassium: 4.7 mmol/L (ref 3.5–5.1)
Sodium: 140 mmol/L (ref 135–145)

## 2016-11-23 NOTE — Progress Notes (Signed)
Patient ID: [name] [name], male   DOB: 08-10-1941, 76 y.o.   MRN: [id number] [facility] Progress Note:   1 Day Post-Op  Subjective: Mental status is alert; No incisional pain (Exparel) Objective: Vital signs in last 24 hours: Temp:  [97.7 F (36.5 C)-99.1 F (37.3 C)] 98.5 F (36.9 C) (01/12 0507) Pulse Rate:  [67-83] 74 (01/12 0507) Resp:  [9-15] 15 (01/12 0507) BP: (90-153)/(52-79) 134/71 (01/12 0507) SpO2:  [95 %-99 %] 97 % (01/12 0507)  Intake/Output from previous day: 01/11 0701 - 01/12 0700 In: 3350 [I.V.:3300; IV Piggyback:50] Out: 1135 [Urine:475; Drains:560; Blood:100] Intake/Output this shift: No intake/output data recorded.  Physical Exam: Work of breathing is not labored;  Left pelvic JP is serosanguinous;  Having some left shoulder pain  Lab Results:  Results for orders placed or performed during the hospital encounter of 11/22/16 (from the past 48 hour(s))  Basic metabolic panel     Status: Abnormal   Collection Time: 11/23/16  4:23 AM  Result Value Ref Range   Sodium 140 135 - 145 mmol/L   Potassium 4.7 3.5 - 5.1 mmol/L   Chloride 109 101 - 111 mmol/L   CO2 24 22 - 32 mmol/L   Glucose, Bld 158 (H) 65 - 99 mg/dL   BUN 24 (H) 6 - 20 mg/dL   Creatinine, Ser 1.19 0.61 - 1.24 mg/dL   Calcium 8.5 (L) 8.9 - 10.3 mg/dL   GFR calc non Af Amer 57 (L) >60 mL/min   GFR calc Af Amer >60 >60 mL/min    Comment: (NOTE) The eGFR has been calculated using the CKD EPI equation. This calculation has not been validated in all clinical situations. eGFR's persistently <60 mL/min signify possible Chronic Kidney Disease.    Anion gap 7 5 - 15  CBC     Status: Abnormal   Collection Time: 11/23/16  4:23 AM  Result Value Ref Range   WBC 10.8 (H) 4.0 - 10.5 K/uL   RBC 3.91 (L) 4.22 - 5.81 MIL/uL   Hemoglobin 12.3 (L) 13.0 - 17.0 g/dL   HCT 35.7 (L) 39.0 - 52.0 %   MCV 91.3 78.0 - 100.0 fL   MCH 31.5 26.0 - 34.0 pg   MCHC 34.5 30.0 - 36.0 g/dL   RDW 13.6 11.5 -  15.5 %   Platelets 168 150 - 400 K/uL    Radiology/Results: No results found.  Anti-infectives: Anti-infectives    Start     Dose/Rate Route Frequency Ordered Stop   11/22/16 2200  cefoTEtan (CEFOTAN) 2 g in dextrose 5 % 50 mL IVPB     2 g 100 mL/hr over 30 Minutes Intravenous Every 12 hours 11/22/16 1611 11/22/16 2232   11/22/16 0843  neomycin (MYCIFRADIN) tablet 1,000 mg  Status:  Discontinued     1,000 mg Oral 3 times per day 11/22/16 0844 11/22/16 1552   11/22/16 0843  metroNIDAZOLE (FLAGYL) tablet 1,000 mg  Status:  Discontinued     1,000 mg Oral 3 times per day 11/22/16 0844 11/22/16 1552   11/22/16 0729  cefoTEtan (CEFOTAN) 2 g in dextrose 5 % 50 mL IVPB     2 g 100 mL/hr over 30 Minutes Intravenous On call to O.R. 11/22/16 0729 11/22/16 1000      Assessment/Plan: Problem List: Patient Active Problem List   Diagnosis Date Noted  . S/P partial resection of colon 11/22/2016  . Colon cancer ([place]) 10/27/2016  . Advanced care planning/counseling discussion 06/05/2016  . Cerumen  impaction 06/05/2016  . Grief at loss of child 03/02/2016  . Medicare annual wellness visit, initial 11/19/2013  . Erosive esophagitis 11/12/2012  . Hyperlipidemia 08/28/2012  . Vitamin D deficiency 09/24/2011  . Paroxysmal a-fib ([place]) 02/10/2010  . PROSTATE CANCER 01/25/2010  . CHF 01/25/2010  . Paget's disease of bone 01/25/2010    Will continue NPO (ice chips).  Slight liquids stool.  Will progress slowly.   1 Day Post-Op    LOS: 1 day   Matt B. Hassell Done, MD, [facility] [phone] beeper [phone]  11/23/2016 11:22 AM

## 2016-11-23 NOTE — Progress Notes (Signed)
Notified Dr. Harlow Asa about pt's urinary output of 214ml since 7pm. Pt has foley catheter in place and it is draining amber clear urine. Vital signs are stable.  Also, made Dr. Harlow Asa aware of pt being bloated. No new orders given. MD will make rounds soon. Will continue monitor.

## 2016-11-24 DIAGNOSIS — D125 Benign neoplasm of sigmoid colon: Secondary | ICD-10-CM

## 2016-11-24 MED ORDER — HEPARIN SODIUM (PORCINE) 5000 UNIT/ML IJ SOLN
5000.0000 [IU] | Freq: Three times a day (TID) | INTRAMUSCULAR | Status: DC
  Administered 2016-11-25 – 2016-11-27 (×7): 5000 [IU] via SUBCUTANEOUS
  Filled 2016-11-24 (×7): qty 1

## 2016-11-24 NOTE — Progress Notes (Signed)
Patient ID: [name] [name], male   DOB: October 08, 1941, 76 y.o.   MRN: [id number] [facility] Progress Note:   1 Day Post-Op  Subjective: Pain ok.  No nausea.  Passing flatus.  Has some right shoulder pain Objective: Vital signs in last 24 hours: Temp:  [98.4 F (36.9 C)-99.1 F (37.3 C)] 98.7 F (37.1 C) (01/13 0700) Pulse Rate:  [67-73] 69 (01/13 0700) Resp:  [16] 16 (01/13 0700) BP: (123-163)/(62-71) 159/64 (01/13 0832) SpO2:  [95 %-97 %] 96 % (01/13 0700)  Intake/Output from previous day: 01/12 0701 - 01/13 0700 In: 1100 [I.V.:1100] Out: 2085 [Urine:1900; Drains:185] Intake/Output this shift: No intake/output data recorded.  Physical Exam: Work of breathing is not labored; Abd soft, mildly distended  Left pelvic JP is serosanguinous  Lab Results:  Results for orders placed or performed during the hospital encounter of 11/22/16 (from the past 48 hour(s))  Basic metabolic panel     Status: Abnormal   Collection Time: 11/23/16  4:23 AM  Result Value Ref Range   Sodium 140 135 - 145 mmol/L   Potassium 4.7 3.5 - 5.1 mmol/L   Chloride 109 101 - 111 mmol/L   CO2 24 22 - 32 mmol/L   Glucose, Bld 158 (H) 65 - 99 mg/dL   BUN 24 (H) 6 - 20 mg/dL   Creatinine, Ser 7.12 0.61 - 1.24 mg/dL   Calcium 8.5 (L) 8.9 - 10.3 mg/dL   GFR calc non Af Amer 57 (L) >60 mL/min   GFR calc Af Amer >60 >60 mL/min    Comment: (NOTE) The eGFR has been calculated using the CKD EPI equation. This calculation has not been validated in all clinical situations. eGFR's persistently <60 mL/min signify possible Chronic Kidney Disease.    Anion gap 7 5 - 15  CBC     Status: Abnormal   Collection Time: 11/23/16  4:23 AM  Result Value Ref Range   WBC 10.8 (H) 4.0 - 10.5 K/uL   RBC 3.91 (L) 4.22 - 5.81 MIL/uL   Hemoglobin 12.3 (L) 13.0 - 17.0 g/dL   HCT 92.9 (L) 09.0 - 30.1 %   MCV 91.3 78.0 - 100.0 fL   MCH 31.5 26.0 - 34.0 pg   MCHC 34.5 30.0 - 36.0 g/dL   RDW 49.9 69.2 - 49.3 %   Platelets 168 150 - 400 K/uL    Radiology/Results: No results found.  Anti-infectives: Anti-infectives    Start     Dose/Rate Route Frequency Ordered Stop   11/22/16 2200  cefoTEtan (CEFOTAN) 2 g in dextrose 5 % 50 mL IVPB     2 g 100 mL/hr over 30 Minutes Intravenous Every 12 hours 11/22/16 1611 11/22/16 2232   11/22/16 0843  neomycin (MYCIFRADIN) tablet 1,000 mg  Status:  Discontinued     1,000 mg Oral 3 times per day 11/22/16 0844 11/22/16 1552   11/22/16 0843  metroNIDAZOLE (FLAGYL) tablet 1,000 mg  Status:  Discontinued     1,000 mg Oral 3 times per day 11/22/16 0844 11/22/16 1552   11/22/16 0729  cefoTEtan (CEFOTAN) 2 g in dextrose 5 % 50 mL IVPB     2 g 100 mL/hr over 30 Minutes Intravenous On call to O.R. 11/22/16 0729 11/22/16 1000      Assessment/Plan: Problem List: Patient Active Problem List   Diagnosis Date Noted  . S/P partial resection of colon 11/22/2016  . Colon cancer (HCC) 10/27/2016  . Advanced care planning/counseling discussion 06/05/2016  . Cerumen  impaction 06/05/2016  . Grief at loss of child 03/02/2016  . Medicare annual wellness visit, initial 11/19/2013  . Erosive esophagitis 11/12/2012  . Hyperlipidemia 08/28/2012  . Vitamin D deficiency 09/24/2011  . Paroxysmal a-fib ([place]) 02/10/2010  . PROSTATE CANCER 01/25/2010  . CHF 01/25/2010  . Paget's disease of bone 01/25/2010    Will have him try some clears today.   Will progress slowly.   Ambulate in hall  2 Day Post-Op    LOS: 2 days

## 2016-11-25 ENCOUNTER — Encounter (HOSPITAL_COMMUNITY): Payer: Self-pay | Admitting: Surgery

## 2016-11-25 DIAGNOSIS — I480 Paroxysmal atrial fibrillation: Secondary | ICD-10-CM | POA: Insufficient documentation

## 2016-11-25 MED ORDER — LACTATED RINGERS IV BOLUS (SEPSIS): 1000.0000 mL | Freq: Three times a day (TID) | INTRAVENOUS | Status: AC | PRN

## 2016-11-25 MED ORDER — SODIUM CHLORIDE 0.9% FLUSH
3.0000 mL | Freq: Two times a day (BID) | INTRAVENOUS | Status: DC
  Administered 2016-11-25 – 2016-11-26 (×4): 3 mL via INTRAVENOUS

## 2016-11-25 MED ORDER — SODIUM CHLORIDE 0.9% FLUSH: 3.0000 mL | INTRAVENOUS | Status: DC | PRN

## 2016-11-25 MED ORDER — METHOCARBAMOL 1000 MG/10ML IJ SOLN
1000.0000 mg | Freq: Four times a day (QID) | INTRAVENOUS | Status: DC | PRN
  Filled 2016-11-25: qty 10

## 2016-11-25 MED ORDER — DIPHENHYDRAMINE HCL 25 MG PO CAPS: 25.0000 mg | ORAL_CAPSULE | Freq: Four times a day (QID) | ORAL | Status: DC | PRN

## 2016-11-25 MED ORDER — MAGNESIUM OXIDE 400 (241.3 MG) MG PO TABS
400.0000 mg | ORAL_TABLET | Freq: Every day | ORAL | Status: DC
  Administered 2016-11-25 – 2016-11-26 (×2): 400 mg via ORAL
  Filled 2016-11-25 (×3): qty 1

## 2016-11-25 MED ORDER — MAGIC MOUTHWASH
15.0000 mL | Freq: Four times a day (QID) | ORAL | Status: DC | PRN
  Filled 2016-11-25: qty 15

## 2016-11-25 MED ORDER — LIP MEDEX EX OINT
1.0000 "application " | TOPICAL_OINTMENT | Freq: Two times a day (BID) | CUTANEOUS | Status: DC
  Administered 2016-11-25 – 2016-11-27 (×5): 1 via TOPICAL
  Filled 2016-11-25: qty 7

## 2016-11-25 MED ORDER — ALUM & MAG HYDROXIDE-SIMETH 200-200-20 MG/5ML PO SUSP: 30.0000 mL | Freq: Four times a day (QID) | ORAL | Status: DC | PRN

## 2016-11-25 MED ORDER — RESVERATROL 50 MG PO CAPS: 20.0000 mg | ORAL_CAPSULE | Freq: Every day | ORAL | Status: DC

## 2016-11-25 MED ORDER — MAGNESIUM 400 MG PO CAPS: 400.0000 mg | ORAL_CAPSULE | Freq: Every morning | ORAL | Status: DC

## 2016-11-25 MED ORDER — VITAMIN C 500 MG PO TABS
1000.0000 mg | ORAL_TABLET | Freq: Two times a day (BID) | ORAL | Status: DC
  Administered 2016-11-25 – 2016-11-26 (×4): 1000 mg via ORAL
  Filled 2016-11-25 (×5): qty 2

## 2016-11-25 MED ORDER — METHOCARBAMOL 500 MG PO TABS: 1000.0000 mg | ORAL_TABLET | Freq: Four times a day (QID) | ORAL | Status: DC | PRN

## 2016-11-25 MED ORDER — ACETAMINOPHEN 500 MG PO TABS: 1000.0000 mg | ORAL_TABLET | Freq: Three times a day (TID) | ORAL | Status: DC

## 2016-11-25 MED ORDER — AMIODARONE HCL 200 MG PO TABS
200.0000 mg | ORAL_TABLET | Freq: Every day | ORAL | Status: DC | PRN
  Filled 2016-11-25: qty 1

## 2016-11-25 MED ORDER — HYDRALAZINE HCL 20 MG/ML IJ SOLN: 5.0000 mg | Freq: Four times a day (QID) | INTRAMUSCULAR | Status: DC | PRN

## 2016-11-25 MED ORDER — MENTHOL 3 MG MT LOZG: 1.0000 | LOZENGE | OROMUCOSAL | Status: DC | PRN

## 2016-11-25 MED ORDER — VITAMIN D3 25 MCG (1000 UNIT) PO TABS
5000.0000 [IU] | ORAL_TABLET | Freq: Every morning | ORAL | Status: DC
  Administered 2016-11-25 – 2016-11-26 (×2): 5000 [IU] via ORAL
  Filled 2016-11-25 (×3): qty 5

## 2016-11-25 MED ORDER — METOPROLOL TARTRATE 5 MG/5ML IV SOLN: 5.0000 mg | Freq: Four times a day (QID) | INTRAVENOUS | Status: DC | PRN

## 2016-11-25 MED ORDER — PROCHLORPERAZINE EDISYLATE 5 MG/ML IJ SOLN: 5.0000 mg | INTRAMUSCULAR | Status: DC | PRN

## 2016-11-25 MED ORDER — SODIUM CHLORIDE 0.9 % IV SOLN: 250.0000 mL | INTRAVENOUS | Status: DC | PRN

## 2016-11-25 MED ORDER — PHENOL 1.4 % MT LIQD: 2.0000 | OROMUCOSAL | Status: DC | PRN

## 2016-11-25 MED ORDER — MORPHINE SULFATE (PF) 2 MG/ML IV SOLN
1.0000 mg | INTRAVENOUS | Status: DC | PRN
Start: 2016-11-25 — End: 2016-11-27

## 2016-11-25 MED ORDER — DIPHENHYDRAMINE HCL 50 MG/ML IJ SOLN: 12.5000 mg | Freq: Four times a day (QID) | INTRAMUSCULAR | Status: DC | PRN

## 2016-11-25 MED ORDER — ADULT MULTIVITAMIN W/MINERALS CH
1.0000 | ORAL_TABLET | Freq: Every morning | ORAL | Status: DC
  Administered 2016-11-25 – 2016-11-26 (×2): 1 via ORAL
  Filled 2016-11-25 (×3): qty 1

## 2016-11-25 MED ORDER — METOPROLOL TARTRATE 25 MG PO TABS: 25.0000 mg | ORAL_TABLET | Freq: Two times a day (BID) | ORAL | Status: DC | PRN

## 2016-11-25 NOTE — Progress Notes (Signed)
[address] [place], [place] [id number] Phone: [phone] FAX: [phone]   [name] [name] [id number] June 11, 1941    Problem List:   Principal Problem:   Adenomatous polyp of colon s/p lap sigmoid colectomy 11/23/2016 Active Problems:   Paget's disease of bone   Vitamin D deficiency   Hyperlipidemia   S/P partial resection of colon   Paroxysmal atrial fibrillation (HCC)   CHF (congestive heart failure) ([place])   3 Days Post-Op  11/22/2016  Procedure(s): LAPAROSCOPIC ASSISTED SIGMOID COLECTOMY   Assessment  Improving  Plan:  -adv full liquids, solids as tolerated -wean IVF w PRN IVF bolus backup given h/o CHF/Afib & +I&O -PRN metoprolol/amiodarone if gets into Afib ("only happens once or twice a year.  I can tell") -VTE prophylaxis- SCDs, etc -mobilize as tolerated to help recovery  I updated the patient's status to the patient and spouse.  Recommendations were made.  Questions were answered.  They expressed understanding & appreciation.   [name] [name], M.D., F.A.C.S. Gastrointestinal and Minimally Invasive Surgery [facility] [address] [place], [place] [address] [phone] Main / Paging   11/25/2016  CARE TEAM:  PCP: [name] Bush, MD  Outpatient Care Team: Patient Care Team: [name] Bush, MD as PCP - General (Family Medicine) [name] Jacks, MD as Consulting Physician (Ophthalmology) [name] Crane, MD as Referring Physician (Internal Medicine) [name] Merritts, MD as Consulting Physician (Cardiology)  Inpatient Treatment Team: Treatment Team: Attending Provider: [name], MD; Registered Nurse: [name] Mouse, RN; Technician: [name] [name], NT; Technician: [name] [name], NT; Registered Nurse: [name] Nephew, RN; Registered Nurse: [name] [place], RN  Subjective:  Tol clears Walking in hallways Wife in room Many  questions  Objective:  Vital signs:  Vitals:   11/24/16 0832 11/24/16 1411 11/24/16 2200 11/25/16 0520  BP: (!) 159/64 (!) 149/71 (!) 168/84 139/73  Pulse:  63 68 60  Resp:  16 20 16   Temp:  98.1 F (36.7 C) 99 F (37.2 C) 97.8 F (36.6 C)  TempSrc:  Oral Oral Oral  SpO2:  97% 97% 98%  Weight:      Height:        Last BM Date: 11/24/16  Intake/Output   Yesterday:  01/13 0701 - 01/14 0700 In: [id number] [P.O.:1200; I.V.:874.2] Out: 1060 [Urine:850; Drains:210] This shift:  No intake/output data recorded.  Bowel function:  Flatus: YES  BM:  YES  Drain: Serosanguinous   Physical Exam:  General: Pt awake/alert/oriented x4 in No acute distress.  Smiling, inquisitive, chatty Eyes: PERRL, normal EOM.  Sclera clear.  No icterus Neuro: CN II-XII intact w/o focal sensory/motor deficits. Lymph: No head/neck/groin lymphadenopathy Psych:  No delerium/psychosis/paranoia HENT: Normocephalic, Mucus membranes moist.  No thrush Neck: Supple, No tracheal deviation Chest: No chest wall pain w good excursion CV:  Pulses intact.  Regular rhythm MS: Normal AROM mjr joints.  No obvious deformity Abdomen: Soft.  Mildy distended.  Mildly tender at incisions only.  No evidence of peritonitis.  No incarcerated hernias. Ext:  SCDs BLE.  No mjr edema.  No cyanosis Skin: No petechiae / purpura  Results:   Labs: No results found for this or any previous visit (from the past 48 hour(s)).  Imaging / Studies: No results found.  Medications / Allergies: per chart  Antibiotics: Anti-infectives    Start     Dose/Rate Route Frequency Ordered Stop   11/22/16 2200  cefoTEtan (  CEFOTAN) 2 g in dextrose 5 % 50 mL IVPB     2 g 100 mL/hr over 30 Minutes Intravenous Every 12 hours 11/22/16 1611 11/22/16 2232   11/22/16 0843  neomycin (MYCIFRADIN) tablet 1,000 mg  Status:  Discontinued     1,000 mg Oral 3 times per day 11/22/16 0844 11/22/16 1552   11/22/16 0843  metroNIDAZOLE (FLAGYL) tablet  1,000 mg  Status:  Discontinued     1,000 mg Oral 3 times per day 11/22/16 0844 11/22/16 1552   11/22/16 0729  cefoTEtan (CEFOTAN) 2 g in dextrose 5 % 50 mL IVPB     2 g 100 mL/hr over 30 Minutes Intravenous On call to O.R. 11/22/16 0729 11/22/16 1000        Note: Portions of this report may have been transcribed using voice recognition software. Every effort was made to ensure accuracy; however, inadvertent computerized transcription errors may be present.   Any transcriptional errors that result from this process are unintentional.     [name] [name], M.D., F.A.C.S. Gastrointestinal and Minimally Invasive Surgery [facility] [address], [place], [place] [address] [phone] Main / Paging   11/25/2016

## 2016-11-25 NOTE — Progress Notes (Signed)
PHARMACIST - PHYSICIAN ORDER COMMUNICATION  CONCERNING: P&T Medication Policy on Herbal Medications  DESCRIPTION:  This patient's order for:  Resveratrol  has been noted.  This product(s) is classified as an "herbal" or natural product. Due to a lack of definitive safety studies or FDA approval, nonstandard manufacturing practices, plus the potential risk of unknown drug-drug interactions while on inpatient medications, the Pharmacy and Therapeutics Committee does not permit the use of "herbal" or natural products of this type within [facility].   ACTION TAKEN: The pharmacy department is unable to verify this order at this time and your patient has been informed of this safety policy. Please reevaluate patient's clinical condition at discharge and address if the herbal or natural product(s) should be resumed at that time.  Netta Cedars, PharmD, BCPS Pager: [phone] 11/25/2016@7 :59 AM

## 2016-11-25 NOTE — Care Management Important Message (Signed)
Important Message  Patient Details  Name: [name] [name] MRN: [id number] Date of Birth: [birth year]   Medicare Important Message Given:  Yes    Apolonio Schneiders, RN 11/25/2016, 11:53 AM

## 2016-11-26 LAB — CBC
HEMATOCRIT: 33.7 % — AB (ref 39.0–52.0)
Hemoglobin: 11.8 g/dL — ABNORMAL LOW (ref 13.0–17.0)
MCH: 32.6 pg (ref 26.0–34.0)
MCHC: 35 g/dL (ref 30.0–36.0)
MCV: 93.1 fL (ref 78.0–100.0)
PLATELETS: 173 10*3/uL (ref 150–400)
RBC: 3.62 MIL/uL — ABNORMAL LOW (ref 4.22–5.81)
RDW: 14 % (ref 11.5–15.5)
WBC: 6.4 10*3/uL (ref 4.0–10.5)

## 2016-11-26 NOTE — Progress Notes (Signed)
Patient ID: [name] [name], male   DOB: Mar 31, 1941, 76 y.o.   MRN: [id number] [facility] Progress Note:   4 Days Post-Op  Subjective: Mental status is clear.  Feels good Objective: Vital signs in last 24 hours: Temp:  [97.7 F (36.5 C)-98 F (36.7 C)] 98 F (36.7 C) (01/15 0639) Pulse Rate:  [63-73] 63 (01/15 0639) Resp:  [15-18] 18 (01/15 0639) BP: (141-167)/(66-71) 141/70 (01/15 0639) SpO2:  [96 %-100 %] 96 % (01/15 0639)  Intake/Output from previous day: 01/14 0701 - 01/15 0700 In: 1180 [P.O.:1080] Out: 1610 [Urine:1500; Drains:110] Intake/Output this shift: No intake/output data recorded.  Physical Exam: Work of breathing is normal.  JP dark serosanguinous  Lab Results:  Results for orders placed or performed during the hospital encounter of 11/22/16 (from the past 48 hour(s))  CBC     Status: Abnormal   Collection Time: 11/26/16  4:35 AM  Result Value Ref Range   WBC 6.4 4.0 - 10.5 K/uL   RBC 3.62 (L) 4.22 - 5.81 MIL/uL   Hemoglobin 11.8 (L) 13.0 - 17.0 g/dL   HCT 33.7 (L) 39.0 - 52.0 %   MCV 93.1 78.0 - 100.0 fL   MCH 32.6 26.0 - 34.0 pg   MCHC 35.0 30.0 - 36.0 g/dL   RDW 14.0 11.5 - 15.5 %   Platelets 173 150 - 400 K/uL    Radiology/Results: No results found.  Anti-infectives: Anti-infectives    Start     Dose/Rate Route Frequency Ordered Stop   11/22/16 2200  cefoTEtan (CEFOTAN) 2 g in dextrose 5 % 50 mL IVPB     2 g 100 mL/hr over 30 Minutes Intravenous Every 12 hours 11/22/16 1611 11/22/16 2232   11/22/16 0843  neomycin (MYCIFRADIN) tablet 1,000 mg  Status:  Discontinued     1,000 mg Oral 3 times per day 11/22/16 0844 11/22/16 1552   11/22/16 0843  metroNIDAZOLE (FLAGYL) tablet 1,000 mg  Status:  Discontinued     1,000 mg Oral 3 times per day 11/22/16 0844 11/22/16 1552   11/22/16 0729  cefoTEtan (CEFOTAN) 2 g in dextrose 5 % 50 mL IVPB     2 g 100 mL/hr over 30 Minutes Intravenous On call to O.R. 11/22/16 0729 11/22/16 1000       Assessment/Plan: Problem List: Patient Active Problem List   Diagnosis Date Noted  . History of intermittent paroxysmal atrial fibrillation 11/25/2016  . Paroxysmal atrial fibrillation (HCC)   . CHF (congestive heart failure) ([place])   . Adenomatous polyp of colon s/p lap sigmoid colectomy 11/23/2016 11/24/2016  . S/P partial resection of colon 11/22/2016  . Colon cancer ([place]) 10/27/2016  . Advanced care planning/counseling discussion 06/05/2016  . Grief at loss of child 03/02/2016  . Hyperlipidemia 08/28/2012  . Vitamin D deficiency 09/24/2011  . PROSTATE CANCER 01/25/2010  . Paget's disease of bone 01/25/2010    Will advance to regular diet.  Lab today OK.  Hopeful discharge tomorrow 4 Days Post-Op    LOS: 4 days   Matt B. Hassell Done, MD, [facility] [phone] beeper [phone]  11/26/2016 9:52 AM

## 2016-11-27 LAB — CBC WITH DIFFERENTIAL/PLATELET
BASOS PCT: 0 %
Basophils Absolute: 0 10*3/uL (ref 0.0–0.1)
EOS ABS: 0.4 10*3/uL (ref 0.0–0.7)
EOS PCT: 5 %
HEMATOCRIT: 36.6 % — AB (ref 39.0–52.0)
Hemoglobin: 12.5 g/dL — ABNORMAL LOW (ref 13.0–17.0)
Lymphocytes Relative: 27 %
Lymphs Abs: 1.8 10*3/uL (ref 0.7–4.0)
MCH: 32.1 pg (ref 26.0–34.0)
MCHC: 34.2 g/dL (ref 30.0–36.0)
MCV: 93.8 fL (ref 78.0–100.0)
MONOS PCT: 12 %
Monocytes Absolute: 0.8 10*3/uL (ref 0.1–1.0)
Neutro Abs: 3.8 10*3/uL (ref 1.7–7.7)
Neutrophils Relative %: 56 %
PLATELETS: 218 10*3/uL (ref 150–400)
RBC: 3.9 MIL/uL — ABNORMAL LOW (ref 4.22–5.81)
RDW: 14.1 % (ref 11.5–15.5)
WBC: 6.8 10*3/uL (ref 4.0–10.5)

## 2016-11-27 MED ORDER — HYDROCODONE-ACETAMINOPHEN 5-325 MG PO TABS: 1.0000 | ORAL_TABLET | ORAL | 0 refills | Status: DC | PRN

## 2016-11-27 NOTE — Discharge Instructions (Signed)
Open Colectomy, Care After °This sheet gives you information about how to care for yourself after your procedure. Your health care provider may also give you more specific instructions. If you have problems or questions, contact your health care provider. °What can I expect after the procedure? °After the procedure, it is common to have: °· Pain in your abdomen, especially along your incision. °· Tiredness. Your energy level will return to normal over the next several weeks. °· Constipation. °· Nausea. °· Difficulty urinating. °Follow these instructions at home: °Activity  °· You may be able to return to most of your normal activities within 1-2 weeks, such as working, walking up stairs, and sexual activity. °· Avoid activities that require a lot of energy for 4-6 weeks after surgery, such as running, climbing, and lifting heavy objects. Ask your health care provider what activities are safe for you. °· Take rest breaks during the day as needed. °· Do not drive for 1-2 weeks or until your health care provider says that it is safe. °· Do not drive or use heavy machinery while taking prescription pain medicines. °· Do not lift anything that is heavier than 10 lb (4.3 kg) until your health care provider says that it is safe. °Incision care  °· Follow instructions from your health care provider about how to take care of your incision. Make sure you: °¨ Wash your hands with soap and water before you change your bandage (dressing). If soap and water are not available, use hand sanitizer. °¨ Change your dressing as told by your health care provider. °¨ Leave stitches (sutures) or staples in place. These skin closures may need to stay in place for 2 weeks or longer. °· Avoid wearing tight clothing around your incision. °· Protect your incision area from the sun. °· Check your incision area every day for signs of infection. Check for: °¨ More redness, swelling, or pain. °¨ More fluid or blood. °¨ Warmth. °¨ Pus or a bad  smell. °General instructions  °· Do not take baths, swim, or use a hot tub until your health care provider approves. Ask your health care provider when you may shower. °· Take over-the-counter and prescription medicines, including stool softeners, only as told by your health care provider. °· Eat a low-fat and low-fiber diet for the first 4 weeks after surgery. °· Keep all follow-up visits as told by your health care provider. This is important. °Contact a health care provider if: °· You have more redness, swelling, or pain around your incision. °· You have more fluid or blood coming from your incision. °· Your incision feels warm to the touch. °· You have pus or a bad smell coming from your incision. °· You have a fever or chills. °· You do not have a bowel movement 2-3 days after surgery. °· You cannot eat or drink for 24 hours or more. °· You have persistent nausea and vomiting. °· You have abdominal pain that gets worse and does not get better with medicine. °Get help right away if: °· You have chest pain. °· You have shortness of breath. °· You have pain or swelling in your legs. °· Your incision breaks open after your sutures or staples have been removed. °· You have bleeding from the rectum. °This information is not intended to replace advice given to you by your health care provider. Make sure you discuss any questions you have with your health care provider. °Document Released: 05/22/2011 Document Revised: 07/30/2016 Document Reviewed: 07/30/2016 °Elsevier   2017 [place].

## 2016-11-27 NOTE — Discharge Summary (Signed)
Physician Discharge Summary  Patient ID: [name] [name] MRN: [id number] DOB/AGE: Mar 07, 1941 76 y.o.  Admit date: 11/22/2016 Discharge date: 11/27/2016  Admission Diagnoses:  Cancer in sigmoid polyp  Discharge Diagnoses:  same  Principal Problem:   Adenomatous polyp of colon s/p lap sigmoid colectomy 11/23/2016 Active Problems:   Paget's disease of bone   Vitamin D deficiency   Hyperlipidemia   S/P partial resection of colon   CHF (congestive heart failure) (HCC)   History of intermittent paroxysmal atrial fibrillation   Surgery:  Lap assisted sigmoid colectomy  Discharged Condition: improved  Hospital Course:   Had surgery.  Was on Entereg.  Bowel function returned and diet advanced.  JP serosanguinous.  Removed prior to discharge.  Staples out.    Consults: none  Significant Diagnostic Studies: path pending    Discharge Exam: Blood pressure (!) 147/65, pulse (!) 59, temperature 98.1 F (36.7 C), temperature source Oral, resp. rate 16, height 5\' 11"  (1.803 m), weight 92.1 kg (203 lb), SpO2 99 %. Incisions bland.    Disposition: 01-Home or Self Care  Discharge Instructions    Call MD for:  persistant nausea and vomiting    Complete by:  As directed    Call MD for:  redness, tenderness, or signs of infection (pain, swelling, redness, odor or green/yellow discharge around incision site)    Complete by:  As directed    Call MD for:  severe uncontrolled pain    Complete by:  As directed    Call MD for:  temperature >100.4    Complete by:  As directed    Diet - low sodium heart healthy    Complete by:  As directed    Increase activity slowly    Complete by:  As directed      Allergies as of 11/27/2016   No Known Allergies     Medication List    TAKE these medications   amiodarone 200 MG tablet Commonly known as:  PACERONE Take 200 mg by mouth daily as needed (a-fib).   cholecalciferol 1000 units tablet Commonly known as:  VITAMIN D Take 5,000 Units by mouth  every morning.   dabigatran 150 MG Caps capsule Commonly known as:  PRADAXA Take 150 mg by mouth daily as needed. Reported on 05/28/2016 Per patient- takes this medication if has an episode of Atrial Fibrillation and lasts more than 2 day!   fish oil-omega-3 fatty acids 1000 MG capsule Take 1 g by mouth every morning.   glucosamine-chondroitin 500-400 MG tablet Take 1 tablet by mouth daily.   HYDROcodone-acetaminophen 5-325 MG tablet Commonly known as:  NORCO/VICODIN Take 1 tablet by mouth every 4 (four) hours as needed for moderate pain.   Magnesium 400 MG Caps Take 400 mg by mouth every morning.   metoprolol tartrate 25 MG tablet Commonly known as:  LOPRESSOR Take 25 mg by mouth 2 (two) times daily as needed (a-fib).   multivitamin with minerals Tabs tablet Take 1 tablet by mouth every morning.   NON FORMULARY 2 scoop 2 (two) times daily as needed (calm plus - when in A-fib). Reported on 05/28/2016   NON FORMULARY Take 1,000 mg by mouth daily. Tumeric   RESVERATROL PO Take 20 mg by mouth daily.   vitamin C 500 MG tablet Commonly known as:  ASCORBIC ACID Take 1,000 mg by mouth 2 (two) times daily.      Follow-up Information    Pedro Earls, MD Follow up.   Specialty:  General Surgery  Contact information: [place] [address] [phone]           Signed: Pedro Earls 11/27/2016, 8:02 AM

## 2016-11-29 ENCOUNTER — Encounter: Payer: Self-pay | Admitting: Family Medicine

## 2017-05-02 ENCOUNTER — Telehealth: Payer: Self-pay | Admitting: Cardiovascular Disease

## 2017-05-02 MED ORDER — METOPROLOL TARTRATE 25 MG PO TABS: 25.0000 mg | ORAL_TABLET | Freq: Two times a day (BID) | ORAL | 0 refills | Status: DC | PRN

## 2017-05-02 MED ORDER — AMIODARONE HCL 200 MG PO TABS: 200.0000 mg | ORAL_TABLET | Freq: Every day | ORAL | 0 refills | Status: DC | PRN

## 2017-05-02 NOTE — Telephone Encounter (Signed)
Pt going out of town & requesting 90 day refill , pt has not been seen since 4/17. Please advise if ok to refill. Pt has f/u appointment w/TG 05/27/17.

## 2017-05-02 NOTE — Telephone Encounter (Signed)
30 day supply w/ 0 refills sent in until he can get in for his appt.

## 2017-05-02 NOTE — Telephone Encounter (Signed)
Pt calling stating he is going out of town today  Would like to pick them up this morning    *STAT* If patient is at the pharmacy, call can be transferred to refill team.   1. Which medications need to be refilled? (please list name of each medication and dose if known) metoprolol 25 mg and his Amiodarone 200 mg   2. Which pharmacy/location (including street and city if local pharmacy) is medication to be sent to?[facility] [place]   3. Do they need a 30 day or 90 day supply? 90 day

## 2017-05-24 DIAGNOSIS — Z87891 Personal history of nicotine dependence: Secondary | ICD-10-CM | POA: Insufficient documentation

## 2017-05-24 NOTE — Progress Notes (Signed)
Cardiology Office Note  Date:  05/27/2017   ID:  [name] [name] Oct 12, 1941, MRN [id number]  PCP:  [name] [name]   Chief Complaint  Patient presents with  . other    12 month follow up. Meds reviewed by the pt. verbally. Pt. c/o a-fib spells.     HPI:  [name] is a 76 year old gentleman, history of  padgets, toe amputation on the left ,  hyperlipidemia ,  paroxysmal atrial fibrillation with cardioversion in December of 2010,  Continued episodes of paroxysmal atrial fibrillation, previously declined anticoagulation and preferred to take pradaxa as needed  stress at home, daughter had cancer, died in 10-26-15 Partial colectomy 11/2016 who presents for routine followup of his atrial fibrillation  In follow-up today he reports having increasing frequency and duration of his atrial fibrillation   heart rates sometimes up to Rate 180, Lasting 12 to 14 hours  Moderately symptomatic Likes to take his amiodarone and pradaxa as needed sometimes takes aspirin Recently started on metoprolol once a day late in the afternoon and feels for the past several weeks he has had less episodes of atrial fibrillation  Chads Vasc of 2  Otherwise he is Active, does his gardening, no regular exercise program, likes to travel Likes to play golf denies any symptoms of shortness of breath or chest pain on exertion  EKG on today's visit shows normal sinus rhythm with rate 56 bpm, no significant ST or T-wave changes  Other past medical history reviewed episode of atrial fibrillation 06/11/2014. This lasted less than one day. He does take amiodarone as needed to restore normal sinus rhythm.  Since the event at the end of July, he is taking metoprolol 12.5 mg twice a day He also takes extra metoprolol for breakthrough arrhythmia  Previous episodes of atrial fibrillation in 01/21/2013 lasting 15 hours and 06/26/2015 lasting less than 24 hours.  In August 2014, he had had his esophagus  stretched and he feels that this attributed to his recurrent arrhythmia.  He is unable to tolerate amiodarone on a regular basis secondary to bradycardia with heart rates in the high 40s, low 50s.     cardiac catheterization in June 2007 that showed no significant coronary artery disease. Ejection fraction at that time was 45-55% and he was in atrial fibrillation with a rapid rate.     PMH:   has a past medical history of Arthritis; Basal cell carcinoma of cheek; Cerumen impaction (06/05/2016); CHF (congestive heart failure) ([place]); Colon cancer ([place]) (10/27/2016); Complication of anesthesia; Erosive esophagitis (2014); Paget's disease; Paroxysmal atrial fibrillation ([place]); and Prostate cancer ([place]) (1998).  PSH:    Past Surgical History:  Procedure Laterality Date  . BASAL CELL CARCINOMA EXCISION    . CARDIAC CATHETERIZATION  2007   No significant CAD  . CARDIOVERSION  2010   A-Fib  . COLONOSCOPY  01/1999   radiation proctitis, rec rpt 5 yrs ([place])  . COLONOSCOPY  10/25/2016   invasive adenocacinoma araising in TVA - referred to surgery, radiation proctitis, rpt 1 yr [name] [name])  . ESOPHAGOGASTRODUODENOSCOPY N/A 02/24/2013   esoph stricture dilated, marked erosive esophagitis; Surgeon: [name] Cunas., [name]  . FLEXIBLE SIGMOIDOSCOPY  10/2016   malignant tumor recto-sigmoid colon, diverticulosis - referred to surgery [name] [name])  . HAMMER TOE SURGERY  04/21/2012   amputation for paget's  . INGUINAL HERNIA REPAIR  2007  . LAPAROSCOPIC SIGMOID COLECTOMY N/A 11/22/2016   path: no residual carcinoma, 13 benign LN, diverticulosis [name] [name])  .  RADIOACTIVE SEED IMPLANT  1998   Prostate cancer [name] [name])  . SAVORY DILATION N/A 02/24/2013   Procedure: SAVORY DILATION;  Surgeon: [name] Cunas., [name];  Location: [name];  Service: [name];  Laterality: N/A;  . SHOULDER SURGERY Right 2004   RTC    Current Outpatient Prescriptions  Medication Sig Dispense Refill  . amiodarone  (PACERONE) 200 MG tablet Take 1 tablet (200 mg total) by mouth daily as needed (a-fib). 60 tablet 6  . cholecalciferol (VITAMIN D) 1000 UNITS tablet Take 5,000 Units by mouth every morning.     . fish oil-omega-3 fatty acids 1000 MG capsule Take 1 g by mouth every morning.     Marland Kitchen glucosamine-chondroitin 500-400 MG tablet Take 1 tablet by mouth daily.    Marland Kitchen HYDROcodone-acetaminophen (NORCO/VICODIN) 5-325 MG tablet Take 1 tablet by mouth every 4 (four) hours as needed for moderate pain. 30 tablet 0  . Magnesium 400 MG CAPS Take 400 mg by mouth every morning.     . metoprolol tartrate (LOPRESSOR) 25 MG tablet Take 1 tablet (25 mg total) by mouth 2 (two) times daily. 180 tablet 3  . Multiple Vitamin (MULTIVITAMIN WITH MINERALS) TABS Take 1 tablet by mouth every morning.    . NON FORMULARY 2 scoop 2 (two) times daily as needed (calm plus - when in A-fib). Reported on 05/28/2016    . NON FORMULARY Take 1,000 mg by mouth daily. Tumeric    . RESVERATROL PO Take 20 mg by mouth daily.    . vitamin C (ASCORBIC ACID) 500 MG tablet Take 1,000 mg by mouth 2 (two) times daily.     Marland Kitchen apixaban (ELIQUIS) 5 MG TABS tablet Take 1 tablet (5 mg total) by mouth 2 (two) times daily. 180 tablet 3   No current facility-administered medications for this visit.      Allergies:   Patient has no known allergies.   Social History:  The patient  reports that he quit smoking about 8 years ago. His smoking use included Cigars. He quit after 20.00 years of use. He has never used smokeless tobacco. He reports that he drinks about 0.5 oz of alcohol per week . He reports that he does not use drugs.   Family History:   family history includes Arrhythmia in his mother; Arthritis in his mother; Cancer in his brother; Diabetes in his mother; Stroke in his father.    Review of Systems: Review of Systems  Constitutional: Negative.   Respiratory: Negative.   Cardiovascular: Negative.   Gastrointestinal: Negative.   Musculoskeletal:  Negative.   Neurological: Negative.   Psychiatric/Behavioral: Negative.   All other systems reviewed and are negative.    PHYSICAL EXAM: VS:  BP (!) 158/70 (BP Location: Left Arm, Patient Position: Sitting, Cuff Size: Normal)   Pulse (!) 56   Ht 5\' 11"  (1.803 m)   Wt 199 lb (90.3 kg)   BMI 27.75 kg/m  , BMI Body mass index is 27.75 kg/m. GEN: Well nourished, well developed, in no acute distress  HEENT: normal  Neck: no JVD, carotid bruits, or masses Cardiac: RRR; no murmurs, rubs, or gallops,no edema  Respiratory:  clear to auscultation bilaterally, normal work of breathing GI: soft, nontender, nondistended, + BS MS: no deformity or atrophy  Skin: warm and dry, no rash Neuro:  Strength and sensation are intact Psych: euthymic mood, full affect    Recent Labs: 11/19/2016: ALT 21 11/23/2016: BUN 24; Creatinine, Ser 1.19; Potassium 4.7; Sodium 140 11/27/2016: Hemoglobin 12.5;  Platelets 218    Lipid Panel Lab Results  Component Value Date   CHOL 187 05/28/2016   HDL 60.60 05/28/2016   LDLCALC 116 (H) 05/28/2016   TRIG 51.0 05/28/2016      Wt Readings from Last 3 Encounters:  05/27/17 199 lb (90.3 kg)  11/22/16 203 lb (92.1 kg)  11/19/16 203 lb 3.2 oz (92.2 kg)       ASSESSMENT AND PLAN:  Paroxysmal atrial fibrillation (HCC) - Plan: EKG 12-Lead Long discussion concerning risk and benefit of anticoagulation Discussed risk of stroke Wife present for the conversation We have recommended he start eliquis 5 mill grams twice a day for stroke prevention Also recommended metoprolol tartrate 25 mg twice a day  Mixed hyperlipidemia - Plan: EKG 12-Lead Currently not on a cholesterol medication, will monitor for now In follow-up visits could consider CT coronary calcium scoring  Paget's disease of bone - Plan: EKG 12-Lead  History of smoking - Plan: EKG 12-Lead We have encouraged him to continue to work on weaning his cigarettes and smoking cessation. He will continue  to work on this and does not want any assistance with chantix.    Total encounter time more than 25 minutes  Greater than 50% was spent in counseling and coordination of care with the patient   Disposition:   F/U  6 months   Orders Placed This Encounter  Procedures  . EKG 12-Lead     Signed, Esmond Plants, M.D., Ph.D. 05/27/2017  [place], [place]

## 2017-05-27 ENCOUNTER — Ambulatory Visit (INDEPENDENT_AMBULATORY_CARE_PROVIDER_SITE_OTHER): Payer: Medicare Other | Admitting: Cardiovascular Disease

## 2017-05-27 ENCOUNTER — Encounter: Payer: Self-pay | Admitting: Cardiovascular Disease

## 2017-05-27 VITALS — BP 158/70 | HR 56 | Ht 71.0 in | Wt 199.0 lb

## 2017-05-27 DIAGNOSIS — I480 Paroxysmal atrial fibrillation: Secondary | ICD-10-CM

## 2017-05-27 DIAGNOSIS — M889 Osteitis deformans of unspecified bone: Secondary | ICD-10-CM | POA: Diagnosis not present

## 2017-05-27 DIAGNOSIS — Z87891 Personal history of nicotine dependence: Secondary | ICD-10-CM

## 2017-05-27 DIAGNOSIS — E782 Mixed hyperlipidemia: Secondary | ICD-10-CM

## 2017-05-27 MED ORDER — METOPROLOL TARTRATE 25 MG PO TABS: 25.0000 mg | ORAL_TABLET | Freq: Two times a day (BID) | ORAL | 3 refills | Status: DC

## 2017-05-27 MED ORDER — APIXABAN 5 MG PO TABS: 5.0000 mg | ORAL_TABLET | Freq: Two times a day (BID) | ORAL | 3 refills | Status: DC

## 2017-05-27 MED ORDER — AMIODARONE HCL 200 MG PO TABS: 200.0000 mg | ORAL_TABLET | Freq: Every day | ORAL | 6 refills | Status: DC | PRN

## 2017-05-27 NOTE — Patient Instructions (Addendum)
Medication Instructions:   Please start eliquis 5 mg twice a day  Increase metoprolol up to 25 mg twice a day  Labwork:  No new labs needed  Testing/Procedures:  No further testing at this time   Follow-Up: It was a pleasure seeing you in the office today. Please call us if you have new issues that need to be addressed before your next appt.  [phone]  Your physician wants you to follow-up in: 6 months.  You will receive a reminder letter in the mail two months in advance. If you don't receive a letter, please call our office to schedule the follow-up appointment.  If you need a refill on your cardiac medications before your next appointment, please call your pharmacy.   Medication Samples have been provided to the patient.  Drug name: Eliquis       Strength: 5 mg        Qty: 4 boxes(56tablets)  LOT: [id number]  Exp.Date: oct 2020

## 2017-06-10 DIAGNOSIS — I480 Paroxysmal atrial fibrillation: Secondary | ICD-10-CM | POA: Diagnosis not present

## 2017-06-10 DIAGNOSIS — C189 Malignant neoplasm of colon, unspecified: Secondary | ICD-10-CM | POA: Diagnosis not present

## 2017-06-14 DIAGNOSIS — I480 Paroxysmal atrial fibrillation: Secondary | ICD-10-CM | POA: Diagnosis not present

## 2017-06-14 DIAGNOSIS — B029 Zoster without complications: Secondary | ICD-10-CM | POA: Diagnosis not present

## 2017-06-23 NOTE — Progress Notes (Signed)
Cardiology Office Note  Date:  06/24/2017   ID:  [name] [name], DOB May 25, 1941, MRN [id number]  PCP:  [name] Rasmussen, [name]   Chief Complaint  Patient presents with  . OTHER    Pt would like to discuss medications c/o nose bleeding/rectal bleeding and bradycardia.  Meds reviewed verbally with pt.    HPI:  [name] [name] is a 76 year old gentleman, history of  padgets, toe amputation on the left   hyperlipidemia ,  paroxysmal atrial fibrillation with cardioversion in December of 2010,  Continued episodes of paroxysmal atrial fibrillation, previously declined anticoagulation and preferred to take pradaxa as needed  stress at home, daughter had cancer, died in 2015-10-17 Partial colectomy 11/2016 who presents for routine followup of his atrial fibrillation  In follow-up today he reports that he has been taking eliquis 2.5 mg twice a day Reports he had some Nose bleeding, Blood on his toilet paper Denies having significant episodes of atrial fibrillation, monitors using external device  Bradycardia on metoprolol 25 BID Worried about heart rate in the low 50s though reports he was asymptomatic  Has been checking blood pressure at home, typically running high 140 up to 202R systolic  Shingles, had symptoms on his back  Denies any shortness of breath or chest pain No regular exercise program. Plays golf  On his last clinic visit he reported having increasing frequency and duration of his atrial fibrillation   heart rates sometimes up to Rate 180, Lasting 12 to 14 hours  Moderately symptomatic Likes to take his amiodarone as needed  Chads Vasc of 2, possibly 3 as blood pressure running high  EKG on today's visit shows normal sinus rhythm with rate 52 bpm, no significant ST or T-wave changes  Other past medical history reviewed episode of atrial fibrillation 06/11/2014. This lasted less than one day. He does take amiodarone as needed to restore normal sinus rhythm.  Since the  event at the end of July, he is taking metoprolol 12.5 mg twice a day He also takes extra metoprolol for breakthrough arrhythmia  Previous episodes of atrial fibrillation in 01/21/2013 lasting 15 hours and 06/26/2015 lasting less than 24 hours.  In August 2014, he had had his esophagus stretched and he feels that this attributed to his recurrent arrhythmia.  He is unable to tolerate amiodarone on a regular basis secondary to bradycardia with heart rates in the high 40s, low 50s.     cardiac catheterization in June 2007 that showed no significant coronary artery disease. Ejection fraction at that time was 45-55% and he was in atrial fibrillation with a rapid rate.     PMH:   has a past medical history of Arthritis; Basal cell carcinoma of cheek; Cerumen impaction (06/05/2016); CHF (congestive heart failure) ([name]); Colon cancer ([name]) (10/27/2016); Complication of anesthesia; Erosive esophagitis (2014); Paget's disease; Paroxysmal atrial fibrillation ([name]); and Prostate cancer ([name]) (1998).  PSH:    Past Surgical History:  Procedure Laterality Date  . BASAL CELL CARCINOMA EXCISION    . CARDIAC CATHETERIZATION  2007   No significant CAD  . CARDIOVERSION  2010   A-Fib  . COLONOSCOPY  01/1999   radiation proctitis, rec rpt 5 yrs ([name])  . COLONOSCOPY  10/16/2016   invasive adenocacinoma araising in TVA - referred to surgery, radiation proctitis, rpt 1 yr [name] [name])  . ESOPHAGOGASTRODUODENOSCOPY N/A 02/24/2013   esoph stricture dilated, marked erosive esophagitis; Surgeon: [name] [name]  . FLEXIBLE SIGMOIDOSCOPY  10/2016   malignant tumor  recto-sigmoid colon, diverticulosis - referred to surgery [name] [name])  . HAMMER TOE SURGERY  04/21/2012   amputation for paget's  . INGUINAL HERNIA REPAIR  2007  . LAPAROSCOPIC SIGMOID COLECTOMY N/A 11/22/2016   path: no residual carcinoma, 13 benign LN, diverticulosis [name] [name])  . RADIOACTIVE SEED IMPLANT  1998   Prostate cancer  [name] [name])  . SAVORY DILATION N/A 02/24/2013   Procedure: SAVORY DILATION;  Surgeon: [name] [name];  Location: [name];  Service: Endoscopy;  Laterality: N/A;  . SHOULDER SURGERY Right 2004   RTC    Current Outpatient Prescriptions  Medication Sig Dispense Refill  . amiodarone (PACERONE) 200 MG tablet Take 1 tablet (200 mg total) by mouth daily as needed (a-fib). 60 tablet 6  . apixaban (ELIQUIS) 5 MG TABS tablet Take 1 tablet (5 mg total) by mouth 2 (two) times daily. 180 tablet 3  . cholecalciferol (VITAMIN D) 1000 UNITS tablet Take 5,000 Units by mouth every morning.     . fish oil-omega-3 fatty acids 1000 MG capsule Take 1 g by mouth every morning.     Marland Kitchen glucosamine-chondroitin 500-400 MG tablet Take 1 tablet by mouth daily.    Marland Kitchen HYDROcodone-acetaminophen (NORCO/VICODIN) 5-325 MG tablet Take 1 tablet by mouth every 4 (four) hours as needed for moderate pain. 30 tablet 0  . Magnesium 400 MG CAPS Take 400 mg by mouth every morning.     . metoprolol tartrate (LOPRESSOR) 25 MG tablet Take 1 tablet (25 mg total) by mouth 2 (two) times daily. 180 tablet 3  . Multiple Vitamin (MULTIVITAMIN WITH MINERALS) TABS Take 1 tablet by mouth every morning.    . NON FORMULARY 2 scoop 2 (two) times daily as needed (calm plus - when in A-fib). Reported on 05/28/2016    . NON FORMULARY Take 1,000 mg by mouth daily. Tumeric    . RESVERATROL PO Take 20 mg by mouth daily.    . vitamin C (ASCORBIC ACID) 500 MG tablet Take 1,000 mg by mouth 2 (two) times daily.      No current facility-administered medications for this visit.      Allergies:   Patient has no known allergies.   Social History:  The patient  reports that he quit smoking about 8 years ago. His smoking use included Cigars. He quit after 20.00 years of use. He has never used smokeless tobacco. He reports that he drinks about 0.5 oz of alcohol per week . He reports that he does not use drugs.   Family History:   family history includes  Arrhythmia in his mother; Arthritis in his mother; Cancer in his brother; Diabetes in his mother; Stroke in his father.    Review of Systems: Review of Systems  Constitutional: Negative.   Respiratory: Negative.   Cardiovascular: Negative.   Gastrointestinal: Negative.   Musculoskeletal: Negative.   Neurological: Negative.   Psychiatric/Behavioral: Negative.   All other systems reviewed and are negative.    PHYSICAL EXAM: VS:  BP (!) 182/74 (BP Location: Left Arm, Patient Position: Sitting, Cuff Size: Normal)   Pulse (!) 52   Ht 5\' 11"  (1.803 m)   Wt 197 lb 9 oz (89.6 kg)   BMI 27.55 kg/m  , BMI Body mass index is 27.55 kg/m. GEN: Well nourished, well developed, in no acute distress  HEENT: normal  Neck: no JVD, carotid bruits, or masses Cardiac: RRR; no murmurs, rubs, or gallops,no edema  Respiratory:  clear to auscultation bilaterally, normal work of breathing  GI: soft, nontender, nondistended, + BS MS: no deformity or atrophy  Skin: warm and dry, no rash Neuro:  Strength and sensation are intact Psych: euthymic mood, full affect    Recent Labs: 11/19/2016: ALT 21 11/23/2016: BUN 24; Creatinine, Ser 1.19; Potassium 4.7; Sodium 140 11/27/2016: Hemoglobin 12.5; Platelets 218    Lipid Panel Lab Results  Component Value Date   CHOL 187 05/28/2016   HDL 60.60 05/28/2016   LDLCALC 116 (H) 05/28/2016   TRIG 51.0 05/28/2016      Wt Readings from Last 3 Encounters:  06/24/17 197 lb 9 oz (89.6 kg)  05/27/17 199 lb (90.3 kg)  11/22/16 203 lb (92.1 kg)       ASSESSMENT AND PLAN:  Paroxysmal atrial fibrillation ([place]) - Long discussion concerning risk and benefit of anticoagulation Discussed risk of stroke He was hoping to take eliquis 2.5 mg twice a day We have discussed with him that this is not the appropriate dosing Dosing guidelines discussed with him Certainly his choice whether to take anticoagulation or not, symptoms of bleeding have not been severe with  minimal nosebleeds, blood on the toilet paper Suggested he decrease metoprolol down to 12.5 mg twice a day as he is concerned about low heart rate does asymptomatic. Interestingly atrial fibrillation episodes have improved per his notes. Recommended he increase eliquis back to 5 twice a day Stop fish oil  Mixed hyperlipidemia - Plan: EKG 12-Lead Recommended weight loss, exercise, Stop fish oil given bleeding as above  Paget's disease of bone - Plan: EKG 12-Lead  History of smoking - Plan: EKG 12-Lead We have encouraged him to continue to work on weaning his cigarettes and smoking cessation. He will continue to work on this and does not want any assistance with chantix.   Long discussion concerning atrial fibrillation and anticoagulation  Total encounter time more than 45 minutes  Greater than 50% was spent in counseling and coordination of care with the patient   Disposition:   F/U  6 months   Orders Placed This Encounter  Procedures  . EKG 12-Lead     Signed, Esmond Plants, M.D., Ph.D. 06/24/2017  [place], [place]

## 2017-06-24 ENCOUNTER — Encounter: Payer: Self-pay | Admitting: Cardiovascular Disease

## 2017-06-24 ENCOUNTER — Ambulatory Visit (INDEPENDENT_AMBULATORY_CARE_PROVIDER_SITE_OTHER): Payer: Medicare Other | Admitting: Cardiovascular Disease

## 2017-06-24 VITALS — BP 155/74 | HR 52 | Ht 71.0 in | Wt 197.6 lb

## 2017-06-24 DIAGNOSIS — I480 Paroxysmal atrial fibrillation: Secondary | ICD-10-CM

## 2017-06-24 DIAGNOSIS — Z7189 Other specified counseling: Secondary | ICD-10-CM

## 2017-06-24 DIAGNOSIS — Z87891 Personal history of nicotine dependence: Secondary | ICD-10-CM

## 2017-06-24 DIAGNOSIS — E782 Mixed hyperlipidemia: Secondary | ICD-10-CM | POA: Diagnosis not present

## 2017-06-24 NOTE — Patient Instructions (Signed)
Medication Instructions:   Please decrease the metoprolol down to 12.5 mg    Labwork:  No new labs needed  Testing/Procedures:  No further testing at this time   Follow-Up: It was a pleasure seeing you in the office today. Please call us if you have new issues that need to be addressed before your next appt.  [phone]  Your physician wants you to follow-up in: 6 months.  You will receive a reminder letter in the mail two months in advance. If you don't receive a letter, please call our office to schedule the follow-up appointment.  If you need a refill on your cardiac medications before your next appointment, please call your pharmacy.

## 2017-06-25 ENCOUNTER — Telehealth: Payer: Self-pay | Admitting: Cardiovascular Disease

## 2017-06-25 NOTE — Telephone Encounter (Signed)
Patient showed up in office for Eliquis samples.  Medication Samples have been provided to the patient.  Drug name: Eliquis       Strength: 5 mg        Qty: 4 boxes  LOT: [id number]  Exp.Date: 12/20

## 2017-08-26 DIAGNOSIS — L720 Epidermal cyst: Secondary | ICD-10-CM | POA: Diagnosis not present

## 2017-08-26 DIAGNOSIS — Z85828 Personal history of other malignant neoplasm of skin: Secondary | ICD-10-CM | POA: Diagnosis not present

## 2017-08-26 DIAGNOSIS — L821 Other seborrheic keratosis: Secondary | ICD-10-CM | POA: Diagnosis not present

## 2017-08-26 DIAGNOSIS — L570 Actinic keratosis: Secondary | ICD-10-CM | POA: Diagnosis not present

## 2017-08-26 DIAGNOSIS — L814 Other melanin hyperpigmentation: Secondary | ICD-10-CM | POA: Diagnosis not present

## 2017-08-26 DIAGNOSIS — D225 Melanocytic nevi of trunk: Secondary | ICD-10-CM | POA: Diagnosis not present

## 2017-08-29 ENCOUNTER — Telehealth: Payer: Self-pay | Admitting: Cardiovascular Disease

## 2017-08-29 NOTE — Telephone Encounter (Signed)
Patient calling the office for samples of medication:   1.  What medication and dosage are you requesting samples for?.  Eliquis 5 mg po BID or coupon  2.  Are you currently out of this medication? Almost  Patient in town for an hour if we could get this done today it would be helpful   Patient aware to try next time to call 2 days ahead of time

## 2017-08-29 NOTE — Telephone Encounter (Signed)
Please review for samples please.

## 2017-08-29 NOTE — Telephone Encounter (Signed)
Pt reports he only has a couple more days of Eliquis. Cost is $150/month Explained we have no samples to provide. Pt asks for a free 30-day card. Explained these are good for one time only but he would like another to see if pharmacy will honor it. Reviewed patient assistance eligibility requirements; pt states he most likely would not qualify. I have left coupon card at front desk for pick up. Pt appreciative of the information.

## 2017-09-02 DIAGNOSIS — R030 Elevated blood-pressure reading, without diagnosis of hypertension: Secondary | ICD-10-CM | POA: Diagnosis not present

## 2017-09-02 DIAGNOSIS — C189 Malignant neoplasm of colon, unspecified: Secondary | ICD-10-CM | POA: Diagnosis not present

## 2017-09-02 DIAGNOSIS — M8888 Osteitis deformans of other bones: Secondary | ICD-10-CM | POA: Diagnosis not present

## 2017-09-02 DIAGNOSIS — M889 Osteitis deformans of unspecified bone: Secondary | ICD-10-CM | POA: Diagnosis not present

## 2017-09-02 DIAGNOSIS — R946 Abnormal results of thyroid function studies: Secondary | ICD-10-CM | POA: Diagnosis not present

## 2017-10-17 DIAGNOSIS — H25813 Combined forms of age-related cataract, bilateral: Secondary | ICD-10-CM | POA: Diagnosis not present

## 2018-01-17 DIAGNOSIS — M75122 Complete rotator cuff tear or rupture of left shoulder, not specified as traumatic: Secondary | ICD-10-CM | POA: Diagnosis not present

## 2018-01-24 DIAGNOSIS — M25512 Pain in left shoulder: Secondary | ICD-10-CM | POA: Diagnosis not present

## 2018-01-27 DIAGNOSIS — M67912 Unspecified disorder of synovium and tendon, left shoulder: Secondary | ICD-10-CM | POA: Diagnosis not present

## 2018-01-29 ENCOUNTER — Telehealth: Payer: Self-pay | Admitting: Cardiovascular Disease

## 2018-01-29 NOTE — Telephone Encounter (Signed)
° °  [facility] Pre-operative Risk Assessment    Request for surgical clearance:  1. What type of surgery is being performed? Left Shoulder Arthroscopy    2. When is this surgery scheduled? 02/11/18  3. What type of clearance is required (medical clearance vs. Pharmacy clearance to hold med vs. Both)? both  4. Are there any medications that need to be held prior to surgery and how long? Unknown   5. Practice name and name of physician performing surgery? [facility] Dr. Volanda Napoleon    6. What is your office phone and fax number? [phone]  Fax [phone]  7. Anesthesia type (None, local, MAC, general) ? Unknown    Clarisse Gouge 01/29/2018, 4:59 PM  _________________________________________________________________   (provider comments below)

## 2018-01-31 ENCOUNTER — Telehealth: Payer: Self-pay | Admitting: Cardiovascular Disease

## 2018-01-31 NOTE — Telephone Encounter (Signed)
Nurses need to  prescreen Which meds to hold? Any chest pain? Any recent stress test?

## 2018-01-31 NOTE — Telephone Encounter (Signed)
Minna Merritts, MD  P Cv Div [facility] Triage        He is acceptable risk for surgery  Arthroscopic shoulder  Can we send a message to surgeon  Would hold Eliquis 2 days prior to surgery  Restart Eliquis when acceptable per the surgeon  thx  TGollan    Routed Epic note to Dr. Tamera Punt, [place], [phone]

## 2018-02-11 DIAGNOSIS — M7542 Impingement syndrome of left shoulder: Secondary | ICD-10-CM | POA: Diagnosis not present

## 2018-02-11 DIAGNOSIS — S46012A Strain of muscle(s) and tendon(s) of the rotator cuff of left shoulder, initial encounter: Secondary | ICD-10-CM | POA: Diagnosis not present

## 2018-02-11 DIAGNOSIS — S46012S Strain of muscle(s) and tendon(s) of the rotator cuff of left shoulder, sequela: Secondary | ICD-10-CM | POA: Diagnosis not present

## 2018-02-11 DIAGNOSIS — Y999 Unspecified external cause status: Secondary | ICD-10-CM | POA: Diagnosis not present

## 2018-02-11 DIAGNOSIS — G8918 Other acute postprocedural pain: Secondary | ICD-10-CM | POA: Diagnosis not present

## 2018-02-11 DIAGNOSIS — X58XXXA Exposure to other specified factors, initial encounter: Secondary | ICD-10-CM | POA: Diagnosis not present

## 2018-02-17 DIAGNOSIS — Z9889 Other specified postprocedural states: Secondary | ICD-10-CM | POA: Diagnosis not present

## 2018-02-24 DIAGNOSIS — M25612 Stiffness of left shoulder, not elsewhere classified: Secondary | ICD-10-CM | POA: Diagnosis not present

## 2018-02-24 DIAGNOSIS — M75122 Complete rotator cuff tear or rupture of left shoulder, not specified as traumatic: Secondary | ICD-10-CM | POA: Diagnosis not present

## 2018-02-24 DIAGNOSIS — M25512 Pain in left shoulder: Secondary | ICD-10-CM | POA: Diagnosis not present

## 2018-02-27 DIAGNOSIS — M75122 Complete rotator cuff tear or rupture of left shoulder, not specified as traumatic: Secondary | ICD-10-CM | POA: Diagnosis not present

## 2018-02-27 DIAGNOSIS — M25512 Pain in left shoulder: Secondary | ICD-10-CM | POA: Diagnosis not present

## 2018-02-27 DIAGNOSIS — M25612 Stiffness of left shoulder, not elsewhere classified: Secondary | ICD-10-CM | POA: Diagnosis not present

## 2018-03-11 DIAGNOSIS — M75122 Complete rotator cuff tear or rupture of left shoulder, not specified as traumatic: Secondary | ICD-10-CM | POA: Diagnosis not present

## 2018-03-11 DIAGNOSIS — M25612 Stiffness of left shoulder, not elsewhere classified: Secondary | ICD-10-CM | POA: Diagnosis not present

## 2018-03-11 DIAGNOSIS — M25512 Pain in left shoulder: Secondary | ICD-10-CM | POA: Diagnosis not present

## 2018-03-13 DIAGNOSIS — M25612 Stiffness of left shoulder, not elsewhere classified: Secondary | ICD-10-CM | POA: Diagnosis not present

## 2018-03-13 DIAGNOSIS — M25512 Pain in left shoulder: Secondary | ICD-10-CM | POA: Diagnosis not present

## 2018-03-13 DIAGNOSIS — M75122 Complete rotator cuff tear or rupture of left shoulder, not specified as traumatic: Secondary | ICD-10-CM | POA: Diagnosis not present

## 2018-03-17 DIAGNOSIS — M25612 Stiffness of left shoulder, not elsewhere classified: Secondary | ICD-10-CM | POA: Diagnosis not present

## 2018-03-17 DIAGNOSIS — M75122 Complete rotator cuff tear or rupture of left shoulder, not specified as traumatic: Secondary | ICD-10-CM | POA: Diagnosis not present

## 2018-03-17 DIAGNOSIS — M25512 Pain in left shoulder: Secondary | ICD-10-CM | POA: Diagnosis not present

## 2018-03-20 DIAGNOSIS — M75122 Complete rotator cuff tear or rupture of left shoulder, not specified as traumatic: Secondary | ICD-10-CM | POA: Diagnosis not present

## 2018-03-20 DIAGNOSIS — M25512 Pain in left shoulder: Secondary | ICD-10-CM | POA: Diagnosis not present

## 2018-03-20 DIAGNOSIS — M25612 Stiffness of left shoulder, not elsewhere classified: Secondary | ICD-10-CM | POA: Diagnosis not present

## 2018-03-24 DIAGNOSIS — M25512 Pain in left shoulder: Secondary | ICD-10-CM | POA: Diagnosis not present

## 2018-03-24 DIAGNOSIS — M75122 Complete rotator cuff tear or rupture of left shoulder, not specified as traumatic: Secondary | ICD-10-CM | POA: Diagnosis not present

## 2018-03-24 DIAGNOSIS — M25612 Stiffness of left shoulder, not elsewhere classified: Secondary | ICD-10-CM | POA: Diagnosis not present

## 2018-03-24 DIAGNOSIS — Z9889 Other specified postprocedural states: Secondary | ICD-10-CM | POA: Diagnosis not present

## 2018-03-26 DIAGNOSIS — Z9889 Other specified postprocedural states: Secondary | ICD-10-CM | POA: Diagnosis not present

## 2018-03-27 DIAGNOSIS — M25512 Pain in left shoulder: Secondary | ICD-10-CM | POA: Diagnosis not present

## 2018-03-27 DIAGNOSIS — M25612 Stiffness of left shoulder, not elsewhere classified: Secondary | ICD-10-CM | POA: Diagnosis not present

## 2018-03-27 DIAGNOSIS — M75122 Complete rotator cuff tear or rupture of left shoulder, not specified as traumatic: Secondary | ICD-10-CM | POA: Diagnosis not present

## 2018-04-01 DIAGNOSIS — M75122 Complete rotator cuff tear or rupture of left shoulder, not specified as traumatic: Secondary | ICD-10-CM | POA: Diagnosis not present

## 2018-04-01 DIAGNOSIS — M25612 Stiffness of left shoulder, not elsewhere classified: Secondary | ICD-10-CM | POA: Diagnosis not present

## 2018-04-01 DIAGNOSIS — M25512 Pain in left shoulder: Secondary | ICD-10-CM | POA: Diagnosis not present

## 2018-04-03 DIAGNOSIS — M25512 Pain in left shoulder: Secondary | ICD-10-CM | POA: Diagnosis not present

## 2018-04-03 DIAGNOSIS — M25612 Stiffness of left shoulder, not elsewhere classified: Secondary | ICD-10-CM | POA: Diagnosis not present

## 2018-04-03 DIAGNOSIS — M75122 Complete rotator cuff tear or rupture of left shoulder, not specified as traumatic: Secondary | ICD-10-CM | POA: Diagnosis not present

## 2018-04-08 DIAGNOSIS — M25512 Pain in left shoulder: Secondary | ICD-10-CM | POA: Diagnosis not present

## 2018-04-08 DIAGNOSIS — M25612 Stiffness of left shoulder, not elsewhere classified: Secondary | ICD-10-CM | POA: Diagnosis not present

## 2018-04-08 DIAGNOSIS — M75122 Complete rotator cuff tear or rupture of left shoulder, not specified as traumatic: Secondary | ICD-10-CM | POA: Diagnosis not present

## 2018-04-10 DIAGNOSIS — M25612 Stiffness of left shoulder, not elsewhere classified: Secondary | ICD-10-CM | POA: Diagnosis not present

## 2018-04-10 DIAGNOSIS — M25512 Pain in left shoulder: Secondary | ICD-10-CM | POA: Diagnosis not present

## 2018-04-10 DIAGNOSIS — M75122 Complete rotator cuff tear or rupture of left shoulder, not specified as traumatic: Secondary | ICD-10-CM | POA: Diagnosis not present

## 2018-04-14 DIAGNOSIS — M25512 Pain in left shoulder: Secondary | ICD-10-CM | POA: Diagnosis not present

## 2018-04-14 DIAGNOSIS — M25612 Stiffness of left shoulder, not elsewhere classified: Secondary | ICD-10-CM | POA: Diagnosis not present

## 2018-04-14 DIAGNOSIS — M75122 Complete rotator cuff tear or rupture of left shoulder, not specified as traumatic: Secondary | ICD-10-CM | POA: Diagnosis not present

## 2018-04-17 DIAGNOSIS — M75122 Complete rotator cuff tear or rupture of left shoulder, not specified as traumatic: Secondary | ICD-10-CM | POA: Diagnosis not present

## 2018-04-17 DIAGNOSIS — M25612 Stiffness of left shoulder, not elsewhere classified: Secondary | ICD-10-CM | POA: Diagnosis not present

## 2018-04-17 DIAGNOSIS — M25512 Pain in left shoulder: Secondary | ICD-10-CM | POA: Diagnosis not present

## 2018-04-22 DIAGNOSIS — M25612 Stiffness of left shoulder, not elsewhere classified: Secondary | ICD-10-CM | POA: Diagnosis not present

## 2018-04-22 DIAGNOSIS — M25512 Pain in left shoulder: Secondary | ICD-10-CM | POA: Diagnosis not present

## 2018-04-22 DIAGNOSIS — M75122 Complete rotator cuff tear or rupture of left shoulder, not specified as traumatic: Secondary | ICD-10-CM | POA: Diagnosis not present

## 2018-04-24 DIAGNOSIS — M25512 Pain in left shoulder: Secondary | ICD-10-CM | POA: Diagnosis not present

## 2018-04-24 DIAGNOSIS — M75122 Complete rotator cuff tear or rupture of left shoulder, not specified as traumatic: Secondary | ICD-10-CM | POA: Diagnosis not present

## 2018-04-24 DIAGNOSIS — M25612 Stiffness of left shoulder, not elsewhere classified: Secondary | ICD-10-CM | POA: Diagnosis not present

## 2018-04-29 DIAGNOSIS — M25612 Stiffness of left shoulder, not elsewhere classified: Secondary | ICD-10-CM | POA: Diagnosis not present

## 2018-04-29 DIAGNOSIS — M25512 Pain in left shoulder: Secondary | ICD-10-CM | POA: Diagnosis not present

## 2018-04-29 DIAGNOSIS — M75122 Complete rotator cuff tear or rupture of left shoulder, not specified as traumatic: Secondary | ICD-10-CM | POA: Diagnosis not present

## 2018-05-01 DIAGNOSIS — M25512 Pain in left shoulder: Secondary | ICD-10-CM | POA: Diagnosis not present

## 2018-05-01 DIAGNOSIS — M25612 Stiffness of left shoulder, not elsewhere classified: Secondary | ICD-10-CM | POA: Diagnosis not present

## 2018-05-01 DIAGNOSIS — M75122 Complete rotator cuff tear or rupture of left shoulder, not specified as traumatic: Secondary | ICD-10-CM | POA: Diagnosis not present

## 2018-05-05 DIAGNOSIS — M25512 Pain in left shoulder: Secondary | ICD-10-CM | POA: Diagnosis not present

## 2018-05-05 DIAGNOSIS — M75122 Complete rotator cuff tear or rupture of left shoulder, not specified as traumatic: Secondary | ICD-10-CM | POA: Diagnosis not present

## 2018-05-05 DIAGNOSIS — M25612 Stiffness of left shoulder, not elsewhere classified: Secondary | ICD-10-CM | POA: Diagnosis not present

## 2018-05-07 DIAGNOSIS — Z9889 Other specified postprocedural states: Secondary | ICD-10-CM | POA: Diagnosis not present

## 2018-05-08 DIAGNOSIS — M25512 Pain in left shoulder: Secondary | ICD-10-CM | POA: Diagnosis not present

## 2018-05-08 DIAGNOSIS — M75122 Complete rotator cuff tear or rupture of left shoulder, not specified as traumatic: Secondary | ICD-10-CM | POA: Diagnosis not present

## 2018-05-08 DIAGNOSIS — M25612 Stiffness of left shoulder, not elsewhere classified: Secondary | ICD-10-CM | POA: Diagnosis not present

## 2018-05-19 DIAGNOSIS — M75122 Complete rotator cuff tear or rupture of left shoulder, not specified as traumatic: Secondary | ICD-10-CM | POA: Diagnosis not present

## 2018-05-19 DIAGNOSIS — M25612 Stiffness of left shoulder, not elsewhere classified: Secondary | ICD-10-CM | POA: Diagnosis not present

## 2018-05-19 DIAGNOSIS — M25512 Pain in left shoulder: Secondary | ICD-10-CM | POA: Diagnosis not present

## 2018-05-22 DIAGNOSIS — M75122 Complete rotator cuff tear or rupture of left shoulder, not specified as traumatic: Secondary | ICD-10-CM | POA: Diagnosis not present

## 2018-05-22 DIAGNOSIS — M25512 Pain in left shoulder: Secondary | ICD-10-CM | POA: Diagnosis not present

## 2018-05-22 DIAGNOSIS — M25612 Stiffness of left shoulder, not elsewhere classified: Secondary | ICD-10-CM | POA: Diagnosis not present

## 2018-05-27 DIAGNOSIS — M25612 Stiffness of left shoulder, not elsewhere classified: Secondary | ICD-10-CM | POA: Diagnosis not present

## 2018-05-27 DIAGNOSIS — M25512 Pain in left shoulder: Secondary | ICD-10-CM | POA: Diagnosis not present

## 2018-05-27 DIAGNOSIS — M75122 Complete rotator cuff tear or rupture of left shoulder, not specified as traumatic: Secondary | ICD-10-CM | POA: Diagnosis not present

## 2018-05-29 DIAGNOSIS — M25612 Stiffness of left shoulder, not elsewhere classified: Secondary | ICD-10-CM | POA: Diagnosis not present

## 2018-05-29 DIAGNOSIS — M25512 Pain in left shoulder: Secondary | ICD-10-CM | POA: Diagnosis not present

## 2018-05-29 DIAGNOSIS — M75122 Complete rotator cuff tear or rupture of left shoulder, not specified as traumatic: Secondary | ICD-10-CM | POA: Diagnosis not present

## 2018-06-02 DIAGNOSIS — M75122 Complete rotator cuff tear or rupture of left shoulder, not specified as traumatic: Secondary | ICD-10-CM | POA: Diagnosis not present

## 2018-06-02 DIAGNOSIS — M25512 Pain in left shoulder: Secondary | ICD-10-CM | POA: Diagnosis not present

## 2018-06-02 DIAGNOSIS — M25612 Stiffness of left shoulder, not elsewhere classified: Secondary | ICD-10-CM | POA: Diagnosis not present

## 2018-06-05 DIAGNOSIS — M25512 Pain in left shoulder: Secondary | ICD-10-CM | POA: Diagnosis not present

## 2018-06-05 DIAGNOSIS — M25612 Stiffness of left shoulder, not elsewhere classified: Secondary | ICD-10-CM | POA: Diagnosis not present

## 2018-06-05 DIAGNOSIS — M75122 Complete rotator cuff tear or rupture of left shoulder, not specified as traumatic: Secondary | ICD-10-CM | POA: Diagnosis not present

## 2018-06-09 DIAGNOSIS — M25512 Pain in left shoulder: Secondary | ICD-10-CM | POA: Diagnosis not present

## 2018-06-09 DIAGNOSIS — M25612 Stiffness of left shoulder, not elsewhere classified: Secondary | ICD-10-CM | POA: Diagnosis not present

## 2018-06-09 DIAGNOSIS — M75122 Complete rotator cuff tear or rupture of left shoulder, not specified as traumatic: Secondary | ICD-10-CM | POA: Diagnosis not present

## 2018-06-11 DIAGNOSIS — M25612 Stiffness of left shoulder, not elsewhere classified: Secondary | ICD-10-CM | POA: Diagnosis not present

## 2018-06-11 DIAGNOSIS — M25512 Pain in left shoulder: Secondary | ICD-10-CM | POA: Diagnosis not present

## 2018-06-11 DIAGNOSIS — M75122 Complete rotator cuff tear or rupture of left shoulder, not specified as traumatic: Secondary | ICD-10-CM | POA: Diagnosis not present

## 2018-06-25 DIAGNOSIS — M25512 Pain in left shoulder: Secondary | ICD-10-CM | POA: Diagnosis not present

## 2018-07-28 DIAGNOSIS — R7989 Other specified abnormal findings of blood chemistry: Secondary | ICD-10-CM | POA: Diagnosis not present

## 2018-07-28 DIAGNOSIS — E291 Testicular hypofunction: Secondary | ICD-10-CM | POA: Diagnosis not present

## 2018-07-28 DIAGNOSIS — F39 Unspecified mood [affective] disorder: Secondary | ICD-10-CM | POA: Diagnosis not present

## 2018-07-28 DIAGNOSIS — I10 Essential (primary) hypertension: Secondary | ICD-10-CM | POA: Diagnosis not present

## 2018-07-28 DIAGNOSIS — Z0001 Encounter for general adult medical examination with abnormal findings: Secondary | ICD-10-CM | POA: Diagnosis not present

## 2018-07-28 DIAGNOSIS — I4891 Unspecified atrial fibrillation: Secondary | ICD-10-CM | POA: Diagnosis not present

## 2018-07-28 DIAGNOSIS — M889 Osteitis deformans of unspecified bone: Secondary | ICD-10-CM | POA: Diagnosis not present

## 2018-07-28 DIAGNOSIS — R7309 Other abnormal glucose: Secondary | ICD-10-CM | POA: Diagnosis not present

## 2018-07-28 DIAGNOSIS — R4189 Other symptoms and signs involving cognitive functions and awareness: Secondary | ICD-10-CM | POA: Diagnosis not present

## 2018-07-28 DIAGNOSIS — E559 Vitamin D deficiency, unspecified: Secondary | ICD-10-CM | POA: Diagnosis not present

## 2018-08-07 ENCOUNTER — Other Ambulatory Visit: Payer: Self-pay | Admitting: Cardiovascular Disease

## 2018-08-26 DIAGNOSIS — L821 Other seborrheic keratosis: Secondary | ICD-10-CM | POA: Diagnosis not present

## 2018-08-26 DIAGNOSIS — Z85828 Personal history of other malignant neoplasm of skin: Secondary | ICD-10-CM | POA: Diagnosis not present

## 2018-08-26 DIAGNOSIS — L570 Actinic keratosis: Secondary | ICD-10-CM | POA: Diagnosis not present

## 2018-08-27 DIAGNOSIS — Z09 Encounter for follow-up examination after completed treatment for conditions other than malignant neoplasm: Secondary | ICD-10-CM | POA: Diagnosis not present

## 2018-09-26 ENCOUNTER — Other Ambulatory Visit: Payer: Self-pay

## 2018-09-28 NOTE — Progress Notes (Signed)
Cardiology Office Note  Date:  09/30/2018   ID:  [name] [name], DOB 27-Apr-1941, MRN [id number]  PCP:  Hayden Rasmussen, MD   Chief Complaint  Patient presents with  . other    6 month follow up. Meds reviewed by the pt. verbally. "doing well."     HPI:  [name] [name] is a 77 year old gentleman, history of  padgets, toe amputation on the left   hyperlipidemia ,  cardiac catheterization in June 2007 that showed no significant coronary artery disease. paroxysmal atrial fibrillation with cardioversion in December of 2010,  Continued episodes of paroxysmal atrial fibrillation, previously declined anticoagulation and preferred to take pradaxa as needed  stress at home, daughter had cancer, died in 10-16-15 Partial colectomy 11/2016 Noncompliance on eliquis, Chads Vasc of  3  who presents for routine followup of his atrial fibrillation  5 episodes of atrial fib, most recently in Oct 15, 2018 Often lasting >15 hours Takes amiodarone as needed, 400 PRN He has not been consistent with his Eliquis, often missing doses, other times taking half a dose Reports having periodic nosebleed, sometimes urinating blood On prior office visit reported having blood on the toilet paper Troubled by bruising Got some Eliquis 2.5 from a neighbor and is using their supply On prior office visit was also doing half dose Eliquis 2.5 Monitor his heart rate using pulse meter  Often cuts down on his metoprolol 12.5mg , other times 25 mg Blood pressure typically 846 systolic Elevated on today's visit Same on last clinic visit.  Not like taking medication  Left shoulder tear, surgery for repair now with good range of motion Doing PT, golf  No chest pain or shortness of breath on exertion  Chads Vasc of  3   EKG personally reviewed by myself on todays visit Shows normal sinus rhythm with rate 55 bpm no significant ST or T wave changes  Lab work reviewed total cholesterol 183 LDL 108 hematocrit 43 glucose  94 normal LFTs alkaline phosphatase 230  Other past medical history reviewed episode of atrial fibrillation 06/11/2014. This lasted less than one day. He does take amiodarone as needed to restore normal sinus rhythm.  Since the event at the end of July, he is taking metoprolol 12.5 mg twice a day He also takes extra metoprolol for breakthrough arrhythmia  Previous episodes of atrial fibrillation in 01/21/2013 lasting 15 hours and 06/26/2015 lasting less than 24 hours.  In August 2014, he had had his esophagus stretched and he feels that this attributed to his recurrent arrhythmia.   cardiac catheterization in June 2007 that showed no significant coronary artery disease. Ejection fraction at that time was 45-55% and he was in atrial fibrillation with a rapid rate.    PMH:   has a past medical history of Arthritis, Basal cell carcinoma of cheek, Cerumen impaction (06/05/2016), CHF (congestive heart failure) ([place]), Colon cancer ([place]) (65/99/3570), Complication of anesthesia, Erosive esophagitis (2014), Paget's disease, Paroxysmal atrial fibrillation ([place]), and Prostate cancer ([place]) (1998).  PSH:    Past Surgical History:  Procedure Laterality Date  . BASAL CELL CARCINOMA EXCISION    . CARDIAC CATHETERIZATION  2007   No significant CAD  . CARDIOVERSION  2010   A-Fib  . COLONOSCOPY  01/1999   radiation proctitis, rec rpt 5 yrs ([facility])  . COLONOSCOPY  2016-10-15   invasive adenocacinoma araising in TVA - referred to surgery, radiation proctitis, rpt 1 yr [name] [name])  . ESOPHAGOGASTRODUODENOSCOPY N/A 02/24/2013   esoph stricture dilated, marked erosive esophagitis;  Surgeon: [name], MD  . FLEXIBLE SIGMOIDOSCOPY  10/2016   malignant tumor recto-sigmoid colon, diverticulosis - referred to surgery [name] [name])  . HAMMER TOE SURGERY  04/21/2012   amputation for paget's  . INGUINAL HERNIA REPAIR  2007  . LAPAROSCOPIC SIGMOID COLECTOMY N/A 11/22/2016   path: no residual carcinoma, 13  benign LN, diverticulosis [name] Hausen, MD)  . RADIOACTIVE SEED IMPLANT  1998   Prostate cancer [name] [name])  . SAVORY DILATION N/A 02/24/2013   Procedure: SAVORY DILATION;  Surgeon: [name], MD;  Location: [facility];  Service: Endoscopy;  Laterality: N/A;  . SHOULDER SURGERY Right 2004   RTC    Current Outpatient Medications  Medication Sig Dispense Refill  . amiodarone (PACERONE) 200 MG tablet Take 1 tablet (200 mg total) by mouth daily as needed (a-fib). 90 tablet 3  . apixaban (ELIQUIS) 5 MG TABS tablet Take 1 tablet (5 mg total) by mouth 2 (two) times daily. 180 tablet 3  . cholecalciferol (VITAMIN D) 1000 UNITS tablet Take 5,000 Units by mouth every morning.     . fish oil-omega-3 fatty acids 1000 MG capsule Take 1 g by mouth every morning.     Marland Kitchen glucosamine-chondroitin 500-400 MG tablet Take 1 tablet by mouth daily.    Marland Kitchen HYDROcodone-acetaminophen (NORCO/VICODIN) 5-325 MG tablet Take 1 tablet by mouth every 4 (four) hours as needed for moderate pain. 30 tablet 0  . Magnesium 400 MG CAPS Take 400 mg by mouth every morning.     . metoprolol tartrate (LOPRESSOR) 25 MG tablet Take 1 tablet (25 mg total) by mouth 2 (two) times daily. 180 tablet 3  . Multiple Vitamin (MULTIVITAMIN WITH MINERALS) TABS Take 1 tablet by mouth every morning.    . NON FORMULARY 2 scoop 2 (two) times daily as needed (calm plus - when in A-fib). Reported on 05/28/2016    . NON FORMULARY Take 1,000 mg by mouth daily. Tumeric    . RESVERATROL PO Take 20 mg by mouth daily.    . vitamin C (ASCORBIC ACID) 500 MG tablet Take 1,000 mg by mouth 2 (two) times daily.      No current facility-administered medications for this visit.      Allergies:   Patient has no known allergies.   Social History:  The patient  reports that he quit smoking about 9 years ago. His smoking use included cigars. He quit after 20.00 years of use. He has never used smokeless tobacco. He reports that he drinks about 1.0 standard  drinks of alcohol per week. He reports that he does not use drugs.   Family History:   family history includes Arrhythmia in his mother; Arthritis in his mother; Cancer in his brother; Diabetes in his mother; Stroke in his father.    Review of Systems: Review of Systems  Constitutional: Negative.   Respiratory: Negative.   Cardiovascular: Positive for palpitations.  Gastrointestinal: Negative.   Musculoskeletal: Negative.   Neurological: Negative.   Psychiatric/Behavioral: Negative.   All other systems reviewed and are negative.    PHYSICAL EXAM: VS:  BP (!) 160/70 (BP Location: Left Arm, Patient Position: Sitting, Cuff Size: Normal)   Pulse (!) 55   Ht 5\' 11"  (1.803 m)   Wt 197 lb 8 oz (89.6 kg)   BMI 27.55 kg/m  , BMI Body mass index is 27.55 kg/m. Constitutional:  oriented to person, place, and time. No distress.  HENT:  Head: Grossly normal Eyes:  no discharge. No scleral  icterus.  Neck: No JVD, no carotid bruits  Cardiovascular: Regular rate and rhythm, no murmurs appreciated Pulmonary/Chest: Clear to auscultation bilaterally, no wheezes or rails Abdominal: Soft.  no distension.  no tenderness.  Musculoskeletal: Normal range of motion Neurological:  normal muscle tone. Coordination normal. No atrophy Skin: Skin warm and dry Psychiatric: normal affect, pleasant   Recent Labs: No results found for requested labs within last 8760 hours.    Lipid Panel Lab Results  Component Value Date   CHOL 187 05/28/2016   HDL 60.60 05/28/2016   LDLCALC 116 (H) 05/28/2016   TRIG 51.0 05/28/2016      Wt Readings from Last 3 Encounters:  09/30/18 197 lb 8 oz (89.6 kg)  06/24/17 197 lb 9 oz (89.6 kg)  05/27/17 199 lb (90.3 kg)       ASSESSMENT AND PLAN:  Paroxysmal atrial fibrillation ([place]) - Long discussion concerning risk and benefit of anticoagulation Discussed risk of stroke Same discussion as on last clinic visit Noncompliant with full dose of Eliquis, Seems  to use lower dose Certainly his choice whether to take anticoagulation or not, symptoms of bleeding have not been severe with minimal nosebleeds, blood on the toilet paper, hematuria We did mention he could see ear nose throat, urology Recommended he increase eliquis back to 5 twice a day Stop fish oil This was also recommended on his prior clinic visit  Mixed hyperlipidemia - Plan: EKG 12-Lead Stop fish oil given bleeding as above Lipid panel done through primary care, total cholesterol 180  Paget's disease of bone - Plan: EKG 12-Lead Elevated alkaline phosphatase, being monitored  History of smoking - Plan: EKG 12-Lead Recommended smoking cessation  Long discussion concerning atrial fibrillation and anticoagulation  Total encounter time more than 25 minutes  Greater than 50% was spent in counseling and coordination of care with the patient   Disposition:   F/U  12 months   Orders Placed This Encounter  Procedures  . EKG 12-Lead     Signed, Esmond Plants, M.D., Ph.D. 09/30/2018  [place], [place]

## 2018-09-30 ENCOUNTER — Ambulatory Visit (INDEPENDENT_AMBULATORY_CARE_PROVIDER_SITE_OTHER): Payer: Medicare Other | Admitting: Cardiovascular Disease

## 2018-09-30 ENCOUNTER — Encounter: Payer: Self-pay | Admitting: Cardiovascular Disease

## 2018-09-30 VITALS — BP 160/70 | HR 55 | Ht 71.0 in | Wt 197.5 lb

## 2018-09-30 DIAGNOSIS — E782 Mixed hyperlipidemia: Secondary | ICD-10-CM

## 2018-09-30 DIAGNOSIS — I480 Paroxysmal atrial fibrillation: Secondary | ICD-10-CM | POA: Diagnosis not present

## 2018-09-30 DIAGNOSIS — Z87891 Personal history of nicotine dependence: Secondary | ICD-10-CM

## 2018-09-30 DIAGNOSIS — M889 Osteitis deformans of unspecified bone: Secondary | ICD-10-CM

## 2018-09-30 MED ORDER — AMIODARONE HCL 200 MG PO TABS: 200.0000 mg | ORAL_TABLET | Freq: Every day | ORAL | 3 refills | Status: DC | PRN

## 2018-09-30 MED ORDER — METOPROLOL TARTRATE 25 MG PO TABS: 25.0000 mg | ORAL_TABLET | Freq: Two times a day (BID) | ORAL | 3 refills | Status: DC

## 2018-09-30 NOTE — Patient Instructions (Addendum)
Medication Instructions:   Please try to stay on the eliquis 5 mg twice a day  If you need a refill on your cardiac medications before your next appointment, please call your pharmacy.    Lab work: No new labs needed   If you have labs (blood work) drawn today and your tests are completely normal, you will receive your results only by: Marland Kitchen MyChart Message (if you have MyChart) OR . A paper copy in the mail If you have any lab test that is abnormal or we need to change your treatment, we will call you to review the results.   Testing/Procedures: No new testing needed   Follow-Up: At [facility], you and your health needs are our priority.  As part of our continuing mission to provide you with exceptional heart care, we have created designated Provider Care Teams.  These Care Teams include your primary Cardiologist (physician) and Advanced Practice Providers (APPs -  Physician Assistants and Nurse Practitioners) who all work together to provide you with the care you need, when you need it.  . You will need a follow up appointment in 12 months .   Please call our office 2 months in advance to schedule this appointment.    . Providers on your designated Care Team:   . Murray Hodgkins, NP . Christell Faith, PA-C . Marrianne Mood, PA-C  Any Other Special Instructions Will Be Listed Below (If Applicable).  For educational health videos Log in to : [email or web address] Or : [email or web address], password : [email or web address]

## 2018-10-01 DIAGNOSIS — H02834 Dermatochalasis of left upper eyelid: Secondary | ICD-10-CM | POA: Diagnosis not present

## 2018-10-01 DIAGNOSIS — H02831 Dermatochalasis of right upper eyelid: Secondary | ICD-10-CM | POA: Diagnosis not present

## 2018-10-01 DIAGNOSIS — H43813 Vitreous degeneration, bilateral: Secondary | ICD-10-CM | POA: Diagnosis not present

## 2018-10-01 DIAGNOSIS — H25813 Combined forms of age-related cataract, bilateral: Secondary | ICD-10-CM | POA: Diagnosis not present

## 2019-07-03 ENCOUNTER — Other Ambulatory Visit: Payer: Self-pay

## 2019-07-03 ENCOUNTER — Encounter: Payer: Self-pay | Admitting: Physician Assistant

## 2019-07-03 ENCOUNTER — Other Ambulatory Visit
Admission: RE | Admit: 2019-07-03 | Discharge: 2019-07-03 | Disposition: A | Discharge status: Home / Self Care | Payer: Medicare Other | Source: Ambulatory Visit | Attending: Physician Assistant | Admitting: Physician Assistant

## 2019-07-03 ENCOUNTER — Telehealth: Payer: Self-pay | Admitting: Cardiovascular Disease

## 2019-07-03 ENCOUNTER — Ambulatory Visit (INDEPENDENT_AMBULATORY_CARE_PROVIDER_SITE_OTHER): Payer: Medicare Other | Admitting: Physician Assistant

## 2019-07-03 VITALS — BP 160/70 | HR 65 | Ht 71.0 in | Wt 199.0 lb

## 2019-07-03 DIAGNOSIS — Z87891 Personal history of nicotine dependence: Secondary | ICD-10-CM

## 2019-07-03 DIAGNOSIS — Z9114 Patient's other noncompliance with medication regimen: Secondary | ICD-10-CM | POA: Diagnosis not present

## 2019-07-03 DIAGNOSIS — I480 Paroxysmal atrial fibrillation: Secondary | ICD-10-CM | POA: Diagnosis not present

## 2019-07-03 DIAGNOSIS — I10 Essential (primary) hypertension: Secondary | ICD-10-CM

## 2019-07-03 DIAGNOSIS — E782 Mixed hyperlipidemia: Secondary | ICD-10-CM | POA: Diagnosis not present

## 2019-07-03 LAB — BASIC METABOLIC PANEL
Anion gap: 6 (ref 5–15)
BUN: 21 mg/dL (ref 8–23)
CO2: 26 mmol/L (ref 22–32)
Calcium: 9.4 mg/dL (ref 8.9–10.3)
Chloride: 106 mmol/L (ref 98–111)
Creatinine, Ser: 1.25 mg/dL — ABNORMAL HIGH (ref 0.61–1.24)
GFR calc Af Amer: >60 mL/min (ref >60)
GFR calc non Af Amer: 54 mL/min — ABNORMAL LOW (ref >60)
Glucose, Bld: 97 mg/dL (ref 70–99)
Potassium: 3.8 mmol/L (ref 3.5–5.1)
Sodium: 138 mmol/L (ref 135–145)

## 2019-07-03 LAB — CBC
HCT: 43.2 % (ref 39.0–52.0)
Hemoglobin: 14.7 g/dL (ref 13.0–17.0)
MCH: 33.2 pg (ref 26.0–34.0)
MCHC: 34 g/dL (ref 30.0–36.0)
MCV: 97.5 fL (ref 80.0–100.0)
Platelets: 172 10*3/uL (ref 150–400)
RBC: 4.43 MIL/uL (ref 4.22–5.81)
RDW: 13.2 % (ref 11.5–15.5)
WBC: 5.8 10*3/uL (ref 4.0–10.5)
nRBC: 0 % (ref 0.0–0.2)

## 2019-07-03 LAB — MAGNESIUM: Magnesium: 2.3 mg/dL (ref 1.7–2.4)

## 2019-07-03 LAB — TSH: TSH: 4.729 u[IU]/mL — ABNORMAL HIGH (ref 0.350–4.500)

## 2019-07-03 NOTE — Telephone Encounter (Signed)
Called home number and no answer or VM after several rings.  No answer on cell number. Left message to call back.   Placed hold on Ryan's appointment this afternoon at 3:30pm.

## 2019-07-03 NOTE — Telephone Encounter (Signed)
Patient scheduled to come in this afternoon.

## 2019-07-03 NOTE — Progress Notes (Signed)
Office Visit    Patient Name: [name] [name] Date of Encounter: 07/03/2019  Primary Care Provider:  Hayden Rasmussen, MD Primary Cardiologist: Dr. Rockey Situ  Chief Complaint    78 yo male with PMH PAF s/p DCCV 10/2009 (CHA2DS2VASc 3), medication noncompliance / [place] noncompliance, cardiac catheterization 04/2006 without significant CAD / EF 45-55% , HTN, HLD, past h/o smoking, erosive esophagitis, prostate CA (1998) / basal cell carcinoma, partial colectomy 2018, and Paget's disease.   Here with c/o Afib per handheld monitor x15 hours.  Past Medical History     Past Medical History:  Diagnosis Date  . Arthritis   . Basal cell carcinoma of cheek   . Cerumen impaction 06/05/2016  . CHF (congestive heart failure) ([place])    history of this  . Colon cancer ([place]) 10/27/2016   Found on flex sig 2017  . Complication of anesthesia    at 09/2016-had high BP with colonoscopy.  . Erosive esophagitis 2014   by EGD [name])  . Paget's disease    Dr. [name] [facility])  . Paroxysmal atrial fibrillation ([facility])   . Prostate cancer ([place]) 1998   s/p seed implants [name]) in remission   Past Surgical History:  Procedure Laterality Date  . BASAL CELL CARCINOMA EXCISION    . CARDIAC CATHETERIZATION  2007   No significant CAD  . CARDIOVERSION  2010   A-Fib  . COLONOSCOPY  01/1999   radiation proctitis, rec rpt 5 yrs ([facility])  . COLONOSCOPY  09/2016   invasive adenocacinoma araising in TVA - referred to surgery, radiation proctitis, rpt 1 yr [name])  . ESOPHAGOGASTRODUODENOSCOPY N/A 02/24/2013   esoph stricture dilated, marked erosive esophagitis; Surgeon: [name], MD  . FLEXIBLE SIGMOIDOSCOPY  10/2016   malignant tumor recto-sigmoid colon, diverticulosis - referred to surgery [name])  . HAMMER TOE SURGERY  04/21/2012   amputation for paget's  . INGUINAL HERNIA REPAIR  2007  . LAPAROSCOPIC SIGMOID COLECTOMY N/A 11/22/2016   path: no residual carcinoma, 13  benign LN, diverticulosis [name], MD)  . RADIOACTIVE SEED IMPLANT  1998   Prostate cancer [name])  . SAVORY DILATION N/A 02/24/2013   Procedure: SAVORY DILATION;  Surgeon: [name], MD;  Location: [facility];  Service: Endoscopy;  Laterality: N/A;  . SHOULDER SURGERY Right 2004   RTC    Allergies  No Known Allergies  History of Present Illness    78 yo male with PMH as above with h/o PAF s/p 2010 DCCV. Patient self reported an episode of Afib 09/2018 lasting more than 15 hours with EKG showing SB at 55bpm. He also has a history of medication noncompliance. He takes his amiodarone and  Metoprolol on an "as needed," which he bases on his mobile EMAY monitor. Reports fatigue with BB. He reported that he will not take his amiodarone unless his mobile monitor says that he needs to, such as when it says he is in Afib. He also stated he will not take his BB if his HR is in the 60s, "due to bradycardia." He has not taken his [place] for some time now due to self reported "dark hand spots," as well as scant blood visualized on his hearing aids upon removal.  Since that time, he has reportedly felt fatigued and "not himself." No other cardiac complaints other than occasional DOE when golfing. 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 He reported a diet high in protein, and that he was "attempting a diet low in carbohydrates." He denied orthopnea, LEE, abdominal distention, or weight gain.   On 8/21, he called the office and reported he did not feel himself with [facility] mobile heart monitoring device (read via his thumb), reporting he was atrial fibrillation for 15 hours. No associated chest pain, SOB, or palpitations. No near syncope or syncope. He took 1 "PRN" amiodarone,  after which time his monitor read a normal rhythm with PVCs. He was seen in the office same day, and while waiting to be seen, he stated his monitor read bigeminy. Subsequent office 8/21 EKG showed SR with rate 65bpm, PVCs/ectopy with compensatory pauses, no acute ST/T abnormality.  BP 160/70. He had not taken his BB/metoprolol that morning, due to "bradycardia with HR in the 60s." He had not taken his [place]. He continued to deny chest pain, palpitations, or racing HR.    Home Medications    Prior to Admission medications   Medication Sig Start Date End Date Taking? Authorizing Provider  amiodarone (PACERONE) 200 MG tablet Take 1 tablet (200 mg total) by mouth daily as needed (a-fib). 09/30/18   Minna Merritts, MD  apixaban (ELIQUIS) 5 MG TABS tablet Take 1 tablet (5 mg total) by mouth 2 (two) times daily. 05/27/17   Minna Merritts, MD  cholecalciferol (VITAMIN D) 1000 UNITS tablet Take 5,000 Units by mouth every morning.     [provider]  fish oil-omega-3 fatty acids 1000 MG capsule Take 1 g by mouth every morning.     [provider]  glucosamine-chondroitin 500-400 MG tablet Take 1 tablet by mouth daily.    [provider]  HYDROcodone-acetaminophen (NORCO/VICODIN) 5-325 MG tablet Take 1 tablet by mouth every 4 (four) hours as needed for moderate pain. 11/27/16   [name], MD  Magnesium 400 MG CAPS Take 400 mg by mouth every morning.     [provider]  metoprolol tartrate (LOPRESSOR) 25 MG tablet Take 1 tablet (25 mg total) by mouth 2 (two) times daily. 09/30/18   Minna Merritts, MD  Multiple Vitamin (MULTIVITAMIN WITH MINERALS) TABS Take 1 tablet by mouth every morning.    [provider]  NON FORMULARY 2 scoop 2 (two) times daily as needed (calm plus - when in A-fib). Reported on 05/28/2016    [provider]  NON FORMULARY Take 1,000 mg by mouth daily. Tumeric    [provider]  RESVERATROL PO Take 20 mg by mouth  daily.    [provider]  vitamin C (ASCORBIC ACID) 500 MG tablet Take 1,000 mg by mouth 2 (two) times daily.     [provider]    Review of Systems    He denies chest pain, palpitations, pnd, orthopnea, n, v, dizziness, syncope, edema, weight gain, or early satiety. +fatigue. All other systems reviewed and are otherwise negative except as noted above.  Physical Exam    VS:  There were no vitals taken for this visit. , BMI There is no height or weight on file to calculate BMI. GEN: Well nourished, well developed, in no acute distress. HEENT: normal. Neck: Supple, no JVD, carotid bruits, or masses. Cardiac: RRR, extrasystole, no murmurs, rubs, or gallops. No clubbing, cyanosis, edema.  Radials/DP/PT 2+ and equal bilaterally.  Respiratory:  Respirations regular and  unlabored, clear to auscultation bilaterally. GI: Soft, nontender, nondistended, BS + x 4. MS: no deformity or atrophy. Skin: warm and dry, no rash. Neuro:  Strength and sensation are intact. Psych: Normal affect.  Accessory Clinical Findings    ECG personally reviewed by me today - SR with rate 65bpm, PVCs/ectopy with compensatory pauses, no acute ST/T abnormality.  - no acute changes.  Assessment & Plan    PAF with RVR, s/p 2010 DCCV --EKG shows SR, PVCs. Feels continued fatigue. No CP, palpitations, racing HR. No pre-syncope or syncope.  --Medication noncompliance reported as below. Stressed need for compliance with BB, amiodarone, and Eliquis as prescribed. Discussed risk of thrombotic event off of [place] as CHA2DS2VASc score of at least 3. Discussed strain to heart with uncontrolled BP and rate. --2 week Zio monitor ordered and placed today to assess for presence / burden of arrhthymia and ectopy.  --Labs updated: TSH, BMET, CBC, and Mg. Will defer annual amiodarone monitoring / labs to primary cardiologist f/u.  --Echocardiogram scheduled. If EF normal, recommend transition from metoprolol to  diltiazem as suspect improved medication compliance due to c/o fatigue on BB. If compliant, more likely to control symptomatic ectopy and improve BP. If EF low, do not recommend diltiazem. Will instead need further ischemic workup of etiology of low EF at that time.  Medication noncompliance --Stressed compliance with mediations. Discussed OAC and risk of thrombotic event. Discussed scant blood /small ecchymosis on occasion is not an indication to stop [place]. Stressed consequences of uncontrolled BP, elevated rates, uncontrolled arrhythmias. Patient indicated his understanding that he should take all cardiac medications as prescribed and not on PRN basis.  HTN, poorly controlled --Recommend medication compliance with BB as above.  HLD, poorly controlled --Continues to take multiple supplements, including Coq10, omega 3, vitamin D/C, tart cherry extract, Mg, tumeric, multivitamin, and others. Not on statin. Last LDL 116.  Past history of smoking --Reported he has not smoked for 30+ years. Stated that he did smoke 1 cigar in the past every so often while gardening to keep the bugs away but has not for years.     Arvil Chaco, PA-C 07/03/2019, 3:25 PM

## 2019-07-03 NOTE — Telephone Encounter (Signed)
Patient states he has been experiencing arrhythmia since Monday night. States he "doesn't feel quite right". States he has a home EKG. Monday 8/18 he had afib, VPB runs slightly after that.  Please call to discuss.  Patient is scheduled for Monday 8/24 with Murray Hodgkins, NP

## 2019-07-03 NOTE — Patient Instructions (Signed)
Medication Instructions:  Your physician recommends that you continue on your current medications as directed. Please refer to the Current Medication list given to you today.  If you need a refill on your cardiac medications before your next appointment, please call your pharmacy.   Lab work: Your physician recommends that you return for lab work TODAY at the medical mall. No appt is needed. Hours are M-F 7AM- 6 PM.  If you have labs (blood work) drawn today and your tests are completely normal, you will receive your results only by: Marland Kitchen MyChart Message (if you have MyChart) OR . A paper copy in the mail If you have any lab test that is abnormal or we need to change your treatment, we will call you to review the results.  Testing/Procedures: 1- Echo  Please return to [facility] on ______________ at _______________ AM/PM for an Echocardiogram. Your physician has requested that you have an echocardiogram. Echocardiography is a painless test that uses sound waves to create images of your heart. It provides your doctor with information about the size and shape of your heart and how well your heart's chambers and valves are working. This procedure takes approximately one hour. There are no restrictions for this procedure. Please note; depending on visual quality an IV may need to be placed.   2- A zio monitor was placed today. It will remain on for 14 days. You will then return monitor and event diary in provided box. It takes 1-2 weeks for report to be downloaded and returned to [place]. We will call you with the results. If monitor falls of or has orange flashing light, please call Zio for further instructions.     Follow-Up: At [facility], you and your health needs are our priority.  As part of our continuing mission to provide you with exceptional heart care, we have created designated Provider Care Teams.  These Care Teams include your primary Cardiologist (physician) and Advanced  Practice Providers (APPs -  Physician Assistants and Nurse Practitioners) who all work together to provide you with the care you need, when you need it. You will need a follow up appointment in 3 months.  You may see Dr. Rockey Situ or Marrianne Mood, PA-C

## 2019-07-03 NOTE — Telephone Encounter (Signed)
Called home number and no answer. Left message to call back.  No answer on cell number. Left message to call back.   Placed hold on Ryan's appointment this afternoon at 3:30pm.

## 2019-07-06 ENCOUNTER — Telehealth: Payer: Self-pay

## 2019-07-06 ENCOUNTER — Ambulatory Visit: Payer: Medicare Other | Admitting: Nurse Practitioner

## 2019-07-06 NOTE — Telephone Encounter (Signed)
Spoke to wife, Izora Gala. Reviewed labs and additional orders needed. He will get these retaken at next OV w PCP this week and will fax results.   No further orders at this time.   Advised pt to call for any further questions or concerns.

## 2019-07-06 NOTE — Telephone Encounter (Signed)
-----   Message from Arvil Chaco, PA-C sent at 07/03/2019 11:10 PM EDT ----- Please let [name] [name] know that his TSH shows very slight elevation at 4.729 (normal TSH 0.350-4.500). Recommend additional thyroid labs at this time to clarify these results. If he does not have an upcoming appointment scheduled with his PCP (at which time he could get labs), recommend we order and collect the labs: TSH, FT4, T3.  Electrolytes looked OK with Mg 2.3 (goal 2.0.) and K 3.8 (goal 4.0).  Renal function and CBC without acute findings as well.  Let us know if any questions.

## 2019-07-14 DIAGNOSIS — Z23 Encounter for immunization: Secondary | ICD-10-CM | POA: Diagnosis not present

## 2019-07-14 DIAGNOSIS — R7989 Other specified abnormal findings of blood chemistry: Secondary | ICD-10-CM | POA: Diagnosis not present

## 2019-07-14 DIAGNOSIS — R4189 Other symptoms and signs involving cognitive functions and awareness: Secondary | ICD-10-CM | POA: Diagnosis not present

## 2019-07-14 DIAGNOSIS — Z125 Encounter for screening for malignant neoplasm of prostate: Secondary | ICD-10-CM | POA: Diagnosis not present

## 2019-07-14 DIAGNOSIS — F39 Unspecified mood [affective] disorder: Secondary | ICD-10-CM | POA: Diagnosis not present

## 2019-07-14 DIAGNOSIS — I4891 Unspecified atrial fibrillation: Secondary | ICD-10-CM | POA: Diagnosis not present

## 2019-07-14 DIAGNOSIS — N400 Enlarged prostate without lower urinary tract symptoms: Secondary | ICD-10-CM | POA: Diagnosis not present

## 2019-07-14 DIAGNOSIS — Z85038 Personal history of other malignant neoplasm of large intestine: Secondary | ICD-10-CM | POA: Diagnosis not present

## 2019-07-14 DIAGNOSIS — M889 Osteitis deformans of unspecified bone: Secondary | ICD-10-CM | POA: Diagnosis not present

## 2019-07-15 ENCOUNTER — Encounter: Payer: Self-pay | Admitting: Physician Assistant

## 2019-07-17 ENCOUNTER — Other Ambulatory Visit: Payer: Self-pay

## 2019-07-17 ENCOUNTER — Telehealth: Payer: Self-pay | Admitting: Cardiovascular Disease

## 2019-07-17 ENCOUNTER — Ambulatory Visit (INDEPENDENT_AMBULATORY_CARE_PROVIDER_SITE_OTHER): Payer: Medicare Other

## 2019-07-17 DIAGNOSIS — I480 Paroxysmal atrial fibrillation: Secondary | ICD-10-CM | POA: Diagnosis not present

## 2019-07-17 MED ORDER — PERFLUTREN LIPID MICROSPHERE
1.0000 mL | INTRAVENOUS | Status: AC | PRN
  Administered 2019-07-17: 2 mL via INTRAVENOUS

## 2019-07-17 NOTE — Telephone Encounter (Signed)
Thank you for the update. Will be on the lookout for the results.

## 2019-07-17 NOTE — Telephone Encounter (Signed)
Patient in office for Echo today but stopped by to ask about medication changes. Patient states that he spoke with PA Jacquelyn about a change in metoprolol and another that he does not recall. Patient would like to be made aware of these changes sooner than his appointment in November. Please advise when able.

## 2019-07-17 NOTE — Telephone Encounter (Signed)
Left voicemail message to call back  

## 2019-07-17 NOTE — Telephone Encounter (Signed)
Spoke with patient and he wanted to see if echocardiogram results were back (he had done this morning) and if JV had reviewed and determined if medication change was needed. Reviewed that it will be a few days before we get those results and recommendations and that we will call him with those findings. He was appreciative for the call with no further questions at this time.

## 2019-07-21 ENCOUNTER — Ambulatory Visit (INDEPENDENT_AMBULATORY_CARE_PROVIDER_SITE_OTHER): Payer: Medicare Other

## 2019-07-21 DIAGNOSIS — I480 Paroxysmal atrial fibrillation: Secondary | ICD-10-CM | POA: Diagnosis not present

## 2019-07-21 NOTE — Telephone Encounter (Signed)
Spoke with patient and advised that testing results have not come back but that when they do we will call. I do not see any monitor results at this time so advised that once we have those someone would be in touch. He expressed interest in getting appointment with JV if at all possible. Advised that I would check on that and let him know when we call with those results. He verbalized understanding with no further questions.

## 2019-07-21 NOTE — Telephone Encounter (Signed)
Patient calling in requesting a consultation with Jacquelyn to go over all medications. Patient is aware that some of his test results are still pending but once they are back he would like an appointment. Patient also states that he "probably has more questions than a phone visit would be helpful for". Please advise on if we should schedule or patient should just be called

## 2019-07-22 ENCOUNTER — Telehealth: Payer: Self-pay

## 2019-07-22 MED ORDER — DILTIAZEM HCL ER COATED BEADS 120 MG PO CP24: 120.0000 mg | ORAL_CAPSULE | Freq: Every day | ORAL | 3 refills | Status: DC

## 2019-07-22 NOTE — Telephone Encounter (Signed)
-----   Message from Arvil Chaco, PA-C sent at 07/21/2019  8:49 PM EDT ----- Please let [name] [name] know that his echocardiogram showed normal pump function with EF 60-65%. His heart did show impaired relaxation in the setting of his elevated BP.    Given these findings, his elevated BP, and his frequent ectopy with normal pump function or EF, recommend that he start Cardizem CD 120mg . Please have him check his BP at home as recommended directly below and follow-up with his readings after starting the medication.  He should stop his metoprolol 25mg  BID.   We are still waiting for the results of his Zio monitor. We should have these results by the time of his follow-up. I am not always in the office, but we could certainly arrange for him to follow-up with me if that is his preference. Also, if he is concerned about any new or concerning symptoms, we could certainly push up his follow-up as well. We can also obtain his TSH and free T3/T4 labs if his PCP is unable to do so at this time.   Please have him continue to monitor his BP at home twice daily. The first in the morning before eating or taking any medications and the second in the evening. Each time he measures, take an additional reading if abnormal to ensure accurate. Do not measure BP right after waking and try to take it before morning medications and after. Take BP before exercising. Avoid caffeine and alcohol for 30 minutes before taking a measurement. Sit quietly for five minutes in a comfortable position with your legs and ankles uncrossed and back supported. Have your arm supported and at the level of your heart. Always use the same arm when taking your blood pressure. Place the cuff over bare skin rather than clothing. If needed, take a repeat BP reading by waiting 1-3 minutes after the first reading. Blood pressure varies often throughout the day with higher readings in the morning. BP may also be lower at home than in the office. It is  helpful to document the time of each BP reading, as well as any activity or medications taken around the reading. In addition, it is helpful to include HR.   In summary:  (1) Stop metoprolol 25mg  BID  (2) Start Cardizem CD 120mg  once daily  (to be titrated based on response). (3) Check BP and HR at home as above after starting new medication. If BP continues to run high, we will plan to increase his dose of Cardizem if room in his heart rate.

## 2019-07-22 NOTE — Telephone Encounter (Signed)
DPR on file. Spoke with th patient's wife and made her aware of the echo results and Jacquelyn's recommendationwith verbalized understanding. Rx for Cardizem sent to the patient's pharmacy. Patient's wife rqst that Jacquelyn's recommendation be released to [facility] for the patient to review. Patient was able to have repeat Tsh at his pcp. They are awaiting the results.

## 2019-07-22 NOTE — Telephone Encounter (Signed)
Results reviewed with patients wife in different encounter.

## 2019-07-22 NOTE — Telephone Encounter (Signed)
DPR on file. Spoke with th patient's wife and made her aware of the echo results and Jacquelyn's recommendationwith verbalized understanding. Rx for Cardizem sent to the patient's pharmacy. Patient's wife rqst that Jacquelyn's recommendation be released to [facility] for the patient to review. Patient was able to have repeat Tsh at his pcp. They are awaiting the results.

## 2019-07-23 ENCOUNTER — Telehealth: Payer: Self-pay | Admitting: Cardiovascular Disease

## 2019-07-23 ENCOUNTER — Other Ambulatory Visit: Payer: Self-pay

## 2019-07-23 NOTE — Telephone Encounter (Signed)
Please call regarding registration for monitor. Patch ID [id number]

## 2019-07-23 NOTE — Telephone Encounter (Signed)
[name] [name] did you put this patients monitor on? I can't seem to find it in [place]

## 2019-07-27 DIAGNOSIS — I482 Chronic atrial fibrillation, unspecified: Secondary | ICD-10-CM | POA: Diagnosis not present

## 2019-07-30 ENCOUNTER — Telehealth: Payer: Self-pay | Admitting: Cardiovascular Disease

## 2019-07-30 NOTE — Telephone Encounter (Signed)
Scheduled 9/18

## 2019-07-30 NOTE — Telephone Encounter (Signed)
Patient calling to be worked in sooner than scheduled fu with dr. Rockey Situ.    Patient concerned about arrhythmias and SOB   Patient declines to be seen sooner  by anyone other than Dr. Rockey Situ or visser and also declines a virtual visit.      Patient added to waitlist .

## 2019-07-30 NOTE — Telephone Encounter (Signed)
Sure. Are you able to see when I am on to be in the office and somehow indicate that the patient requested me as well?

## 2019-07-31 ENCOUNTER — Ambulatory Visit (INDEPENDENT_AMBULATORY_CARE_PROVIDER_SITE_OTHER): Payer: Medicare Other | Admitting: Physician Assistant

## 2019-07-31 ENCOUNTER — Encounter: Payer: Self-pay | Admitting: Physician Assistant

## 2019-07-31 ENCOUNTER — Other Ambulatory Visit: Payer: Self-pay

## 2019-07-31 VITALS — BP 150/66 | HR 55 | Ht 71.0 in | Wt 197.0 lb

## 2019-07-31 DIAGNOSIS — Z87891 Personal history of nicotine dependence: Secondary | ICD-10-CM

## 2019-07-31 DIAGNOSIS — Z9114 Patient's other noncompliance with medication regimen: Secondary | ICD-10-CM

## 2019-07-31 DIAGNOSIS — I472 Ventricular tachycardia: Secondary | ICD-10-CM | POA: Diagnosis not present

## 2019-07-31 DIAGNOSIS — I10 Essential (primary) hypertension: Secondary | ICD-10-CM | POA: Diagnosis not present

## 2019-07-31 DIAGNOSIS — Z9189 Other specified personal risk factors, not elsewhere classified: Secondary | ICD-10-CM | POA: Diagnosis not present

## 2019-07-31 DIAGNOSIS — R5383 Other fatigue: Secondary | ICD-10-CM

## 2019-07-31 DIAGNOSIS — I480 Paroxysmal atrial fibrillation: Secondary | ICD-10-CM

## 2019-07-31 DIAGNOSIS — E785 Hyperlipidemia, unspecified: Secondary | ICD-10-CM

## 2019-07-31 DIAGNOSIS — F419 Anxiety disorder, unspecified: Secondary | ICD-10-CM

## 2019-07-31 DIAGNOSIS — I471 Supraventricular tachycardia: Secondary | ICD-10-CM

## 2019-07-31 DIAGNOSIS — I4729 Other ventricular tachycardia: Secondary | ICD-10-CM

## 2019-07-31 MED ORDER — LISINOPRIL 20 MG PO TABS: 20.0000 mg | ORAL_TABLET | Freq: Every day | ORAL | 3 refills | Status: DC

## 2019-07-31 NOTE — Patient Instructions (Addendum)
Medication Instructions:  Your physician has recommended you make the following change in your medication:  1. START Lisinopril 20 mg once daily  If you need a refill on your cardiac medications before your next appointment, please call your pharmacy.   Lab work: BMET in one week. Please go to [facility] to have that done and results will be sent to you in Mychart  If you have labs (blood work) drawn today and your tests are completely normal, you will receive your results only by: Marland Kitchen MyChart Message (if you have MyChart) OR . A paper copy in the mail If you have any lab test that is abnormal or we need to change your treatment, we will call you to review the results.  Testing/Procedures: [place]  Your caregiver has ordered a Stress Test with nuclear imaging. The purpose of this test is to evaluate the blood supply to your heart muscle. This procedure is referred to as a "Non-Invasive Stress Test." This is because other than having an IV started in your vein, nothing is inserted or "invades" your body. Cardiac stress tests are done to find areas of poor blood flow to the heart by determining the extent of coronary artery disease (CAD). Some patients exercise on a treadmill, which naturally increases the blood flow to your heart, while others who are  unable to walk on a treadmill due to physical limitations have a pharmacologic/chemical stress agent called Lexiscan . This medicine will mimic walking on a treadmill by temporarily increasing your coronary blood flow.   Please note: these test may take anywhere between 2-4 hours to complete  PLEASE REPORT TO [place] AT THE FIRST DESK WILL DIRECT YOU WHERE TO GO  Date of Procedure: October 8th  Arrival Time for Procedure: 07:45 AM    PLEASE NOTIFY THE OFFICE AT LEAST 24 HOURS IN ADVANCE IF YOU ARE UNABLE TO KEEP YOUR APPOINTMENT.  [phone] AND  PLEASE NOTIFY NUCLEAR MEDICINE AT [facility] AT  LEAST 24 HOURS IN ADVANCE IF YOU ARE UNABLE TO KEEP YOUR APPOINTMENT. [phone]  How to prepare for your Myoview test:  1. Do not eat or drink after midnight 2. No caffeine for 24 hours prior to test 3. No smoking 24 hours prior to test. 4. Your medication may be taken with water.  If your doctor stopped a medication because of this test, do not take that medication. 5. Ladies, please do not wear dresses.  Skirts or pants are appropriate. Please wear a short sleeve shirt. 6. No perfume, cologne or lotion. 7. Wear comfortable walking shoes. No heels!   Follow-Up: At [facility], you and your health needs are our priority.  As part of our continuing mission to provide you with exceptional heart care, we have created designated Provider Care Teams.  These Care Teams include your primary Cardiologist (physician) and Advanced Practice Providers (APPs -  Physician Assistants and Nurse Practitioners) who all work together to provide you with the care you need, when you need it. Marland Kitchen KEEP SCHEDULED APPOINTMENT WITH DR. Rockey Situ    10/05/19 at 2:40 PM   Any Other Special Instructions Will Be Listed Below (If Applicable).    How to Take Your Blood Pressure You can take your blood pressure at home with a machine. You may need to check your blood pressure at home:  To check if you have high blood pressure (hypertension).  To check your blood pressure over time.  To make sure  your blood pressure medicine is working. Supplies needed: You will need a blood pressure machine, or monitor. You can buy one at a drugstore or online. When choosing one:  Choose one with an arm cuff.  Choose one that wraps around your upper arm. Only one finger should fit between your arm and the cuff.  Do not choose one that measures your blood pressure from your wrist or finger. Your doctor can suggest a monitor. How to prepare Avoid these things for 30 minutes before checking your blood pressure:  Drinking  caffeine.  Drinking alcohol.  Eating.  Smoking.  Exercising. Five minutes before checking your blood pressure:  Pee.  Sit in a dining chair. Avoid sitting in a soft couch or armchair.  Be quiet. Do not talk. How to take your blood pressure Follow the instructions that came with your machine. If you have a digital blood pressure monitor, these may be the instructions: 1. Sit up straight. 2. Place your feet on the floor. Do not cross your ankles or legs. 3. Rest your left arm at the level of your heart. You may rest it on a table, desk, or chair. 4. Pull up your shirt sleeve. 5. Wrap the blood pressure cuff around the upper part of your left arm. The cuff should be 1 inch (2.5 cm) above your elbow. It is best to wrap the cuff around bare skin. 6. Fit the cuff snugly around your arm. You should be able to place only one finger between the cuff and your arm. 7. Put the cord inside the groove of your elbow. 8. Press the power button. 9. Sit quietly while the cuff fills with air and loses air. 10. Write down the numbers on the screen. 11. Wait 2-3 minutes and then repeat steps 1-10. What do the numbers mean? Two numbers make up your blood pressure. The first number is called systolic pressure. The second is called diastolic pressure. An example of a blood pressure reading is "120 over 80" (or 120/80). If you are an adult and do not have a medical condition, use this guide to find out if your blood pressure is normal: Normal  First number: below 120.  Second number: below 80. Elevated  First number: 120-129.  Second number: below 80. Hypertension stage 1  First number: 130-139.  Second number: 80-89. Hypertension stage 2  First number: 140 or above.  Second number: 11 or above. Your blood pressure is above normal even if only the top or bottom number is above normal. Follow these instructions at home:  Check your blood pressure as often as your doctor tells you  to.  Take your monitor to your next doctor's appointment. Your doctor will: ? Make sure you are using it correctly. ? Make sure it is working right.  Make sure you understand what your blood pressure numbers should be.  Tell your doctor if your medicines are causing side effects. Contact a doctor if:  Your blood pressure keeps being high. Get help right away if:  Your first blood pressure number is higher than 180.  Your second blood pressure number is higher than 120. This information is not intended to replace advice given to you by your health care provider. Make sure you discuss any questions you have with your health care provider. Document Released: 10/11/2008 Document Revised: 10/11/2017 Document Reviewed: 04/06/2016 Elsevier Patient Education  2020 [place].  How to Increase Your Level of Physical Activity  Getting regular physical activity is important for  your overall health and well-being. Most people do not get enough exercise. There are easy ways to increase your level of physical activity, even if you have not been very active in the past or you are just starting out. Why is physical activity important? Physical activity has many short-term and long-term health benefits. Regular exercise can:  Help you lose weight or maintain a healthy weight.  Strengthen your muscles and bones.  Boost your mood and improve self-esteem.  Reduce your risk of certain long-term (chronic) diseases, like heart disease, cancer, and diabetes.  Help you stay capable of walking and moving around (mobile) as you age.  Prevent accidents, such as falls, as you age.  Increase life expectancy. What are the benefits of being physically active on a regular basis? In addition to improving your physical health, being physically active on most days of the week can help you in ways that you may not expect. Benefits of regular physical activity may include:  Feeling good about your  body.  Being able to move around more easily and for longer periods of time without getting tired (increased stamina).  Finding new sources of fun and enjoyment.  Meeting new people who share a common interest.  Being able to fight off illness better (enhanced immunity).  Being able to sleep better. What can happen if I am not physically active on a regular basis? Not getting enough physical activity can lead to an unhealthy lifestyle and future health problems. This can increase your chances of:  Becoming overweight or obese.  Becoming sick.  Developing chronic illnesses, like heart disease or diabetes.  Having mental health problems, like depression or anxiety.  Having sleep problems.  Having trouble walking or getting yourself around (reduced mobility).  Injuring yourself in a fall as you get older. What steps can I take to be more physically active?   Check with your health care provider about how to get started. Ask your health care provider what activities are safe for you.  Start out slowly. Walking or doing some simple chair exercises is a good place to start, especially if you have not been active before or for a long time.  Try to find activities that you enjoy. You are more likely to commit to an exercise routine if it does not feel like a chore.  If you have bone or joint problems, choose low-impact exercises, like walking or swimming.  Include physical activity in your everyday routine.  Invite friends or family members to exercise with you. This also will help you commit to your workout plan.  Set goals that you can work toward.  Aim for at least 150 minutes of moderate-intensity exercise each week. Examples of moderate-intensity exercise include walking or riding a bike. Where to find more information  Centers for Disease Control and Prevention: BowlingGrip.is  President's Council on Fitness, Sports & Nutrition  www.http://villegas.org/  ChooseMyPlate: WirelessM[employer].dk Contact a health care provider if:  You have headaches, muscle aches, or joint pain.  You feel dizzy or light-headed while exercising.  You faint.  You have chest pain while exercising. Summary  Exercise benefits your mind and body at any age, even if you are just starting out.  If you have a chronic illness or have not been active for a while, check with your health care provider before increasing your physical activity.  Choose activities that are safe and enjoyable for you. Ask your health care provider what activities are safe for you.  Start slowly.  Tell your health care provider if you have problems as you start to increase your activity level. This information is not intended to replace advice given to you by your health care provider. Make sure you discuss any questions you have with your health care provider. Document Released: 10/18/2016 Document Revised: 02/23/2019 Document Reviewed: 10/18/2016 Elsevier Patient Education  2020 [place].  Cardiac Nuclear Scan A cardiac nuclear scan is a test that is done to check the flow of blood to your heart. It is done when you are resting and when you are exercising. The test looks for problems such as:  Not enough blood reaching a portion of the heart.  The heart muscle not working as it should. You may need this test if:  You have heart disease.  You have had lab results that are not normal.  You have had heart surgery or a balloon procedure to open up blocked arteries (angioplasty).  You have chest pain.  You have shortness of breath. In this test, a special dye (tracer) is put into your bloodstream. The tracer will travel to your heart. A camera will then take pictures of your heart to see how the tracer moves through your heart. This test is usually done at a hospital and takes 2-4 hours. Tell a doctor about:  Any allergies you  have.  All medicines you are taking, including vitamins, herbs, eye drops, creams, and over-the-counter medicines.  Any problems you or family members have had with anesthetic medicines.  Any blood disorders you have.  Any surgeries you have had.  Any medical conditions you have.  Whether you are pregnant or may be pregnant. What are the risks? Generally, this is a safe test. However, problems may occur, such as:  Serious chest pain and heart attack. This is only a risk if the stress portion of the test is done.  Rapid heartbeat.  A feeling of warmth in your chest. This feeling usually does not last long.  Allergic reaction to the tracer. What happens before the test?  Ask your doctor about changing or stopping your normal medicines. This is important.  Follow instructions from your doctor about what you cannot eat or drink.  Remove your jewelry on the day of the test. What happens during the test?  An IV tube will be inserted into one of your veins.  Your doctor will give you a small amount of tracer through the IV tube.  You will wait for 20-40 minutes while the tracer moves through your bloodstream.  Your heart will be monitored with an electrocardiogram (ECG).  You will lie down on an exam table.  Pictures of your heart will be taken for about 15-20 minutes.  You may also have a stress test. For this test, one of these things may be done: ? You will be asked to exercise on a treadmill or a stationary bike. ? You will be given medicines that will make your heart work harder. This is done if you are unable to exercise.  When blood flow to your heart has peaked, a tracer will again be given through the IV tube.  After 20-40 minutes, you will get back on the exam table. More pictures will be taken of your heart.  Depending on the tracer that is used, more pictures may need to be taken 3-4 hours later.  Your IV tube will be removed when the test is over. The test  may vary among doctors and hospitals. What happens after the  test?  Ask your doctor: ? Whether you can return to your normal schedule, including diet, activities, and medicines. ? Whether you should drink more fluids. This will help to remove the tracer from your body. Drink enough fluid to keep your pee (urine) pale yellow.  Ask your doctor, or the department that is doing the test: ? When will my results be ready? ? How will I get my results? Summary  A cardiac nuclear scan is a test that is done to check the flow of blood to your heart.  Tell your doctor whether you are pregnant or may be pregnant.  Before the test, ask your doctor about changing or stopping your normal medicines. This is important.  Ask your doctor whether you can return to your normal activities. You may be asked to drink more fluids. This information is not intended to replace advice given to you by your health care provider. Make sure you discuss any questions you have with your health care provider. Document Released: 04/14/2018 Document Revised: 02/18/2019 Document Reviewed: 04/14/2018 Elsevier Patient Education  2020 [employer].

## 2019-07-31 NOTE — Progress Notes (Signed)
Office Visit    Patient Name: [name] [name] Date of Encounter: 07/31/2019  Primary Care Provider:  Hayden Rasmussen, MD Primary Cardiologist:  No primary care provider on file.  Chief Complaint    78 yo male with PMH PAF s/p DCCV 10/2009 (CHA2DS2VASc 3), medication noncompliance / [place] noncompliance, cardiac catheterization 04/2006 without significant CAD / EF 45-55% , HTN, HLD, past h/o smoking, erosive esophagitis, prostate CA (1998) / basal cell carcinoma, partial colectomy 2018, and Paget's disease and here for follow-up.   Past Medical History    Past Medical History:  Diagnosis Date  . Arthritis   . Basal cell carcinoma of cheek   . Cerumen impaction 06/05/2016  . CHF (congestive heart failure) ([place])    history of this  . Colon cancer ([place]) 10/27/2016   Found on flex sig 2017  . Complication of anesthesia    at 09/2016-had high BP with colonoscopy.  . Erosive esophagitis 2014   by EGD [name])  . Paget's disease    Dr. [name])  . Paroxysmal atrial fibrillation (HCC)   . Prostate cancer ([place]) 1998   s/p seed implants [name]) in remission   Past Surgical History:  Procedure Laterality Date  . BASAL CELL CARCINOMA EXCISION    . CARDIAC CATHETERIZATION  2007   No significant CAD  . CARDIOVERSION  2010   A-Fib  . COLONOSCOPY  01/1999   radiation proctitis, rec rpt 5 yrs ([place])  . COLONOSCOPY  09/2016   invasive adenocacinoma araising in TVA - referred to surgery, radiation proctitis, rpt 1 yr [name])  . ESOPHAGOGASTRODUODENOSCOPY N/A 02/24/2013   esoph stricture dilated, marked erosive esophagitis; Surgeon: [name], MD  . FLEXIBLE SIGMOIDOSCOPY  10/2016   malignant tumor recto-sigmoid colon, diverticulosis - referred to surgery [name])  . HAMMER TOE SURGERY  04/21/2012   amputation for paget's  . INGUINAL HERNIA REPAIR  2007  . LAPAROSCOPIC SIGMOID COLECTOMY N/A 11/22/2016   path: no residual carcinoma, 13 benign LN,  diverticulosis [name], MD)  . RADIOACTIVE SEED IMPLANT  1998   Prostate cancer [name])  . SAVORY DILATION N/A 02/24/2013   Procedure: SAVORY DILATION;  Surgeon: [name], MD;  Location: [facility];  Service: Endoscopy;  Laterality: N/A;  . SHOULDER SURGERY Right 2004   RTC    Allergies  No Known Allergies  History of Present Illness    78 yo male with PMH as above with h/o PAF s/p 2010 DCCV. Patient self reported an episode of Afib 09/2018 lasting more than 15 hours with EKG showing SB at 55bpm. He also has a history of medication noncompliance. He takes his amiodarone and  Metoprolol on an "as needed," which he bases on his mobile EMAY monitor. Reports fatigue with BB. He reported that he will not take his amiodarone unless his mobile monitor says that he needs to, such as when it says he is in Afib. He also stated he will not take his BB if his HR is in the 60s, "due to bradycardia." He has not taken his [place] for some time now due to self reported "dark hand spots," as well as scant blood visualized on his hearing aids upon removal.  Since that time, he has reportedly felt fatigued and "not himself." No other cardiac complaints other than occasional DOE when golfing. He denied a regular workout routine; however, he stated he does not have CP, SOB, palpitations, racing HR, or presyncope/syncope with "more strenuous  farm work." He reported previous alcohol use as once every weeks; however, he denied any alcohol use in two weeks. He reported a diet high in protein, and that he was "attempting a diet low in carbohydrates." He denied orthopnea, LEE, abdominal distention, or weight gain.   On 8/21, he called the office and reported he did not feel himself with [facility] mobile heart monitoring device (read via his thumb), reporting he was atrial fibrillation for 15 hours. He stated he was taking "PRN amiodarone" at a rate of 1 pill (200mg ) every hours whenever his device showed atrial  fibrillation. He reported that he would take this amount of amiodarone until the device showed NSR, sometimes taking days to weeks at a time.On further questioning regarding this regimen, he reported he always calls the office if he is going to take amiodarone every 4 hours for more than 24 hours. The patient had not been taking his [place], however, after a discussion regarding the risks associated with no [place], he reported he would take his [place] going forward.It was discussed that 8/21 EKG showed SR with rate 65bpm, PVCs/ectopy with compensatory pauses, no acute ST/T abnormality.  BP 160/70. When asked about his BB/metoprolol, he reported he does not take it if his HR is in the 60s, which is more often than not. After a long discussion regarding medication options, he agreed to obtain an echo to assess EF, and if normal, start low dose Cardizem CD. If his EF was decreased, he was informed that we will need to explore further ischemic evaluation.   A 2 week Zio monitor was mailed and performed 8/21 to 9/4 (before start of Cardizem CD) with preliminary results as below but official results still pending. Preliminary results showed mainly SR with min HR 41 and max HR 197. Average HR was 62bpm. He had 2 runs of ventricular tachycardia, the fastest/longest interval lasting 6 beats with a max rate of 141 bpm. He had 82 SVT versus AT with variable block episodes with the fastest interval 7 beats and max rate of 197 bpm. The longest lasted 18 beats with an average rate of 144 bpm. Ventricular bigeminy and trigeminy were also present.   Echo was obtained 9/4 (see below) with EF normal as below. He was therefore started on low dose Cardizem CD 120mg , given his concern regarding his lower rates.   Labs were obtained and within limits (TSH was elevated on labs; however, it was repeated with PCP with repeat TSH wnl).   On 9/17, the patient called the office and requested earlier office follow-up for questions. The patient  continued to deny CP, palpitations, or racing HR. He denied presyncope or syncope. He also denied orthostatic hypotension. He stated that he only knew when his heart was racing or skipping beats, because he would use his EMAY monitor, or if he would monitor his pulse. He was asymptomatic otherwise. He reported considerable concern regarding lower home BP readings in the AM with SBP 110s, as well as lower HR in the 60s to high 50s. He reported ongoing fatigue, especially during golf, and sometimes when walking for a long time. This fatigue was not associated with SOB / DOE, leg weakness, or leg claudication. He admitted that he had not worked out since 12/2018 due to COVID-19, so could be deconditioned. Over the past couple of months, he had tried cutting out caffeine; however, he had restarted it when he read an article that said caffeine did not influence cardiac electrical activity /  cardiac health. He also felt that he did not notice improvement in his device's readings without caffeine. He was now drinking ~16oz of caffeine a day. He also drank herbal tea, but stated he may change to black tea, as he had read that additional herbal ingredients could be influencing his health. He had cut out alcohol recently, but as he also did not notice a difference with this change, he intended to restart it with a small 2-5 oz glass of wine each night. He had researched several different medical supplements and cardiac conditions that could be contributing to his fatigue, which were discussed as well. He reported [place] compliance.  EKG 9/18 showed SB, 55bpm, no acute changes, and a significant improvement in ectopy. BP improved from previous appt yet still suboptimal. Also reviewed were most recent echo results, Zio, PCP lab results, previous echo/stress results, several home print outs from the patient, and home BP readings.   Home Medications    Prior to Admission medications   Medication Sig Start Date End Date Taking?  Authorizing Provider  amiodarone (PACERONE) 200 MG tablet Take 1 tablet (200 mg total) by mouth daily as needed (a-fib). 09/30/18   Minna Merritts, MD  apixaban (ELIQUIS) 5 MG TABS tablet Take 1 tablet (5 mg total) by mouth 2 (two) times daily. 05/27/17   Minna Merritts, MD  cholecalciferol (VITAMIN D) 1000 UNITS tablet Take 5,000 Units by mouth every morning.     [provider]  diltiazem (CARDIZEM CD) 120 MG 24 hr capsule Take 1 capsule (120 mg total) by mouth daily. 07/22/19 10/20/19  Marrianne Mood D, PA-C  fish oil-omega-3 fatty acids 1000 MG capsule Take 1 g by mouth every morning.     [provider]  glucosamine-chondroitin 500-400 MG tablet Take 1 tablet by mouth daily.    [provider]  HYDROcodone-acetaminophen (NORCO/VICODIN) 5-325 MG tablet Take 1 tablet by mouth every 4 (four) hours as needed for moderate pain. 11/27/16   [name], MD  Magnesium 400 MG CAPS Take 400 mg by mouth every morning.     [provider]  Multiple Vitamin (MULTIVITAMIN WITH MINERALS) TABS Take 1 tablet by mouth every morning.    [provider]  NON FORMULARY 2 scoop 2 (two) times daily as needed (calm plus - when in A-fib). Reported on 05/28/2016    [provider]  NON FORMULARY Take 1,000 mg by mouth daily. Tumeric    [provider]  RESVERATROL PO Take 20 mg by mouth daily.    [provider]  Turmeric (QC TUMERIC COMPLEX PO) Take by mouth.    [provider]  vitamin C (ASCORBIC ACID) 500 MG tablet Take 1,000 mg by mouth 2 (two) times daily.     [provider]    Review of Systems    He denies chest pain, palpitations, dyspnea, pnd, orthopnea, n, v, dizziness, syncope, edema, weight gain, or early satiety. ++fatigue.  All other systems reviewed and are otherwise negative except as noted above.   Physical Exam    VS:  BP (!) 150/66 (BP Location: Left Arm, Patient Position: Sitting, Cuff Size:  Normal)   Pulse (!) 55   Ht 5\' 11"  (1.803 m)   Wt 197 lb (89.4 kg)   BMI 27.48 kg/m  , BMI Body mass index is 27.48 kg/m. GEN: Well nourished, well developed, in no acute distress. HEENT: normal. Neck: Supple, no JVD, carotid bruits, or masses. Cardiac: Bradycardic but regular, no  murmurs, rubs, or gallops. No clubbing, cyanosis, edema.  Radials/DP/PT 2+ and equal bilaterally.  Respiratory:  Respirations regular and unlabored, clear to auscultation bilaterally. GI: Soft, nontender, nondistended, BS + x 4. MS: no deformity or atrophy. Skin: warm and dry, no rash. Neuro:  Strength and sensation are intact. Psych: Normal affect.  Accessory Clinical Findings    ECG personally reviewed by me today - SB, 55bpm, borderline PRi at 155ms, IVCD with QRS 112 ms, LVH - no acute changes.  Zio Preliminary Findings --Patient had a min HR of 41 bpm, max HR of 197 bpm, and avg HR of 62 bpm. --Predominant underlying rhythm was Sinus Rhythm.  --2 Ventricular Tachycardia runs occurred, the run with the fastest interval lasting 6 beats with a max rate of 141 bpm (avg 114 bpm); the run with the fastest interval was also the longest.  --82 Supraventricular Tachycardia runs occurred, the run with the fastest interval lasting 7 beats with a max rate of 197 bpm, the longest lasting 18 beats with an avg rate of 144 bpm.  --Some episodes of Supraventricular Tachycardia may be possible Atrial Tachycardia with variable block.  --Isolated SVEs were rare (<1.0%), SVE Couplets were rare (<1.0%), and SVE Triplets were rare (<1.0%). Isolated VEs were frequent (12.5%, [id number]), VE Couplets were rare (<1.0%, 927), and VE Triplets were rare (<1.0%, 13).  Ventricular Bigeminy and Trigeminy were present.  --- Echo 07/17/2019  1. The left ventricle has normal systolic function with an ejection fraction of 60-65%. The cavity size was normal. Left ventricular diastolic Doppler parameters are consistent with impaired  relaxation.  2. The right ventricle has normal systolic function. The cavity was normal. There is no increase in right ventricular wall thickness.Unable to estimate RVSP  3. Left atrial size was mildly dilated.  4. There is dilatation of the aortic root and of the ascending aorta 4.0 cm.  ------ 8/21 Labs: K 3.8, Cr 1.25, BUN 21; Hgb 14.7, RBC 4.43 TSH 4.729 with PCP repeat lab wnl    Assessment & Plan    PAF s/p 2010 DCCV Ectopy, NSVT, SVT --Maintaining SR. Asymptomatic with ectopy and denies CP, SOB, DOE, palpitations, racing HR, presyncope, or syncope. Only reports fatigue. Worries regarding ectopy as he often checks his pulse, uses EMAY at home.  --Prior to starting Cardizem CD, 2 week Zio monitor performed with preliminary results as above (not yet formally read): Mainly SR with frequent ectopy, runs of SVT versus AT with variable block, short NSVT. Echo as above with EF normal. Labs with Electrolytes stable. TSH recently checked and elevated. Repeat TSH per PCP wnl. --Continue OAC. CHA2DS2VASc score of at least 3. Compliance confirmed.  No s/sx of bleeding. Recent CBC wnl.  --Continue Cardizem CD at 120mg  (current dose). Confirmed he is taking daily. Significant improvement in ectopy as seen on most recent EKG today, compared to that of his previous clinic EKG a few weeks ago. No plan to escalate dose, given bradycardic rate. --Prescribed PRN amiodarone. Will defer further amiodarone monitoring / testing to primary cardiologist, as previously noted. At follow-up, recommend reassessment of patient's understanding of amiodarone regimen.Marland Kitchen He denies taking amiodarone in larger amounts than prescribed by his primary cardiologist; however, his current regimen differs greatly from that noted and prescribed by his cardiologist. We reviewed this in detail today with recommendation he avoid taking more than prescribed. --Given ongoing concern for fatigue and concern for ectopy as indicated by his  machine, scheduled for stress test. Previous 2002 stress test ruled  low risk. Risk factors for CAD include history of smoking, HLD, uncontrolled BP.   Hypertension --Improved but still suboptimally controlled on Cardizem CD. No room for escalation of Cardizem, given bradycardic rates.   --Start Lisinopril 20mg  daily. Continue Cardizem.  --Follow-up BMET in 1 week.  --Reviewed proper technique for taking BP at home, and print out provided for patient. Long discussion regarding the effects of uncontrolled BP over time, especially in relation to cardiac health. Dietary and lifestyle changes recommended, including avoiding large amounts of alcohol/caffeine/salt/ibuprofen.   Sedentary lifestyle, Fatigue --Patient agreed to increase activity as tolerated. Long discussion regarding the effects of a sedentary lifestyle on health. Hydrate with workouts. --Scheduled for stress test, as above, given ongoing fatigue and concern for ectopy. Patient does have risk factors for CAD, including previous smoker, HLD, and uncontrolled HTN. Previous 2002 stress test ruled low risk. Of note, patient would like to avoid regadenoson if possible. Instructed that he should inquire about exercise treadmill stress tests before his Lexiscan to see if available at that time.  Anxiety --As above, will plan for stress test, given patient's ongoing fatigue and concern for ectopy and underlying cardiac etiology. Discussed EP evaluation if stress test low risk and he expresses continued concern. Deferred further discussion regarding EP evaluation, however, to follow-up and pending patient's increase in activity and stress test results.  HLD --Not on a statin per patient preference. LDL 7/17 of 116. Taking supplements.   Medication noncompliance --Confirmed compliance. Review amiodarone dosing again and at next follow-up as above given suspicion he may be taking more than prescribed at times.   Disposition: Start Lisinopril 20mg   once daily. Follow-up BMET in 1 week. Continue current dose of Cardizem CD. Monitor BP. Increase activity as tolerated. Stress test with Lexiscan Myoview in October. As above, if patient wishes to have treadmill stress, he should call before his Myoview and see if they are available at that time. Follow-up 10/05/2019 or sooner if needed.  A minimum of 60 minutes spent this appointment reviewing test results, lab results, previous studies, and patient research as well as discussing patient concerns and options for treatment.   Of note, cardiac studies brought to today's appointment will be initialed for scan into patient's chart.  Arvil Chaco, PA-C 07/31/2019, 9:19 PM

## 2019-08-04 ENCOUNTER — Encounter: Payer: Self-pay | Admitting: Cardiovascular Disease

## 2019-08-04 DIAGNOSIS — Z23 Encounter for immunization: Secondary | ICD-10-CM | POA: Diagnosis not present

## 2019-08-04 DIAGNOSIS — I480 Paroxysmal atrial fibrillation: Secondary | ICD-10-CM

## 2019-08-04 DIAGNOSIS — R4189 Other symptoms and signs involving cognitive functions and awareness: Secondary | ICD-10-CM | POA: Diagnosis not present

## 2019-08-04 DIAGNOSIS — I4891 Unspecified atrial fibrillation: Secondary | ICD-10-CM | POA: Diagnosis not present

## 2019-08-04 NOTE — Telephone Encounter (Signed)
Patient calling Patient saw PCP today PCP would like for a C reactive protein order to be placed in addition to BMP lab Please call to discuss

## 2019-08-05 NOTE — Telephone Encounter (Signed)
Incoming call from patient. He reports [facility] He saw PCP this am and she wanted C reactive protein added as he did not want to be stuck twice.   [facility] for [facility].   No further questions at this time.   Advised pt to call for any further questions or concerns.   Pt will print results from mychart and share with PCP.

## 2019-08-05 NOTE — Telephone Encounter (Signed)
This encounter was created in error - please disregard.

## 2019-08-07 ENCOUNTER — Other Ambulatory Visit
Admission: RE | Admit: 2019-08-07 | Discharge: 2019-08-07 | Disposition: A | Discharge status: Home / Self Care | Payer: Medicare Other | Source: Ambulatory Visit | Attending: Physician Assistant | Admitting: Physician Assistant

## 2019-08-07 DIAGNOSIS — R5383 Other fatigue: Secondary | ICD-10-CM | POA: Diagnosis not present

## 2019-08-07 DIAGNOSIS — I480 Paroxysmal atrial fibrillation: Secondary | ICD-10-CM | POA: Diagnosis not present

## 2019-08-07 LAB — BASIC METABOLIC PANEL
Anion gap: 9 (ref 5–15)
BUN: 19 mg/dL (ref 8–23)
CO2: 25 mmol/L (ref 22–32)
Calcium: 9.6 mg/dL (ref 8.9–10.3)
Chloride: 108 mmol/L (ref 98–111)
Creatinine, Ser: 1.03 mg/dL (ref 0.61–1.24)
GFR calc Af Amer: >60 mL/min (ref >60)
GFR calc non Af Amer: >60 mL/min (ref >60)
Glucose, Bld: 108 mg/dL — ABNORMAL HIGH (ref 70–99)
Potassium: 4.3 mmol/L (ref 3.5–5.1)
Sodium: 142 mmol/L (ref 135–145)

## 2019-08-07 LAB — C-REACTIVE PROTEIN: CRP: <0.8 mg/dL (ref <1.0)

## 2019-08-13 DIAGNOSIS — G3184 Mild cognitive impairment, so stated: Secondary | ICD-10-CM | POA: Diagnosis not present

## 2019-08-13 DIAGNOSIS — Z8673 Personal history of transient ischemic attack (TIA), and cerebral infarction without residual deficits: Secondary | ICD-10-CM | POA: Diagnosis not present

## 2019-08-13 DIAGNOSIS — G9389 Other specified disorders of brain: Secondary | ICD-10-CM | POA: Diagnosis not present

## 2019-08-20 ENCOUNTER — Encounter
Admission: RE | Admit: 2019-08-20 | Discharge: 2019-08-20 | Disposition: A | Discharge status: Home / Self Care | Payer: Medicare Other | Source: Ambulatory Visit | Attending: Physician Assistant | Admitting: Physician Assistant

## 2019-08-20 ENCOUNTER — Other Ambulatory Visit: Payer: Self-pay

## 2019-08-20 DIAGNOSIS — R5383 Other fatigue: Secondary | ICD-10-CM

## 2019-08-20 DIAGNOSIS — I480 Paroxysmal atrial fibrillation: Secondary | ICD-10-CM | POA: Insufficient documentation

## 2019-08-25 ENCOUNTER — Other Ambulatory Visit: Payer: Self-pay

## 2019-08-25 ENCOUNTER — Encounter
Admission: RE | Admit: 2019-08-25 | Discharge: 2019-08-25 | Disposition: A | Discharge status: Home / Self Care | Payer: Medicare Other | Source: Ambulatory Visit | Attending: Physician Assistant | Admitting: Physician Assistant

## 2019-08-25 DIAGNOSIS — I480 Paroxysmal atrial fibrillation: Secondary | ICD-10-CM | POA: Insufficient documentation

## 2019-08-25 DIAGNOSIS — R5383 Other fatigue: Secondary | ICD-10-CM | POA: Insufficient documentation

## 2019-08-25 MED ORDER — TECHNETIUM TC 99M TETROFOSMIN IV KIT
10.0000 | PACK | Freq: Once | INTRAVENOUS | Status: AC | PRN
  Administered 2019-08-25: 09:00:00 10.62 via INTRAVENOUS

## 2019-08-25 MED ORDER — REGADENOSON 0.4 MG/5ML IV SOLN
0.4000 mg | Freq: Once | INTRAVENOUS | Status: AC
  Administered 2019-08-25: 10:00:00 0.4 mg via INTRAVENOUS

## 2019-08-25 MED ORDER — TECHNETIUM TC 99M TETROFOSMIN IV KIT
32.8560 | PACK | Freq: Once | INTRAVENOUS | Status: AC | PRN
  Administered 2019-08-25: 10:00:00 32.856 via INTRAVENOUS

## 2019-08-26 LAB — NM MYOCAR MULTI W/SPECT W/WALL MOTION / EF
LV dias vol: 129 mL (ref 62–150)
LV sys vol: 49 mL
Peak HR: 74 {beats}/min
Percent HR: 52 %
Rest HR: 56 {beats}/min
TID: 0.93

## 2019-08-28 ENCOUNTER — Telehealth: Payer: Self-pay

## 2019-08-28 NOTE — Telephone Encounter (Addendum)
Pts wife, Izora Gala on [place] advised his stress test results..   She wanted Dr. Rockey Situ to know that he has seen his PCP Dr. Darron Doom recently and she noticed some changes in his demeanor and she is worried he is exhibiting dementia symptoms.. she ordered an MRI of the brain and it showed some changes in his brain matter and one of his lobes and will be seeing neurology 10/14/19 with the thought he may have had a TIA. They wanted Dr.Gollan to know... he is seeing Dr.Gollan back 10/05/19. Will call if they need anything prior to his appt.

## 2019-08-28 NOTE — Telephone Encounter (Signed)
-----   Message from Arvil Chaco, PA-C sent at 08/27/2019 10:16 PM EDT ----- Please let [name] [name] know that his stress test results were normal and ruled a low risk study. His pump function was normal. Overall, very reassuring results. The study did note aortic and coronary artery atherosclerosis (or hardening of arteries). Given these findings, I recommend strict control of both your blood pressure and cholesterol (adding a statin) for appropriate risk factor prevention and to prevent worsening atherosclerosis. Adjustment / additional blood pressure and cholesterol medications can be discussed at your very next follow-up (10/05/19) with Dr. Rockey Situ.   The study also showed cholelithiasis or gallstones. The majority of people with gallstones do not have any symptoms and do not need any treatment. With any new GI symptoms (such as nausea/vomiting/abdominal pain), recommend follow-up with gastroenterology.

## 2019-09-07 ENCOUNTER — Other Ambulatory Visit: Payer: Self-pay

## 2019-09-07 ENCOUNTER — Ambulatory Visit (INDEPENDENT_AMBULATORY_CARE_PROVIDER_SITE_OTHER): Payer: Medicare Other | Admitting: Neurology

## 2019-09-07 ENCOUNTER — Encounter

## 2019-09-07 ENCOUNTER — Encounter: Payer: Self-pay | Admitting: Neurology

## 2019-09-07 ENCOUNTER — Other Ambulatory Visit (INDEPENDENT_AMBULATORY_CARE_PROVIDER_SITE_OTHER): Payer: Medicare Other

## 2019-09-07 VITALS — BP 153/65 | HR 59 | Ht 71.0 in | Wt 199.1 lb

## 2019-09-07 DIAGNOSIS — G3184 Mild cognitive impairment, so stated: Secondary | ICD-10-CM

## 2019-09-07 DIAGNOSIS — R413 Other amnesia: Secondary | ICD-10-CM

## 2019-09-07 NOTE — Patient Instructions (Signed)
1. Bloodwork for B12  2. Follow-up in 6 months, call for any changes   RECOMMENDATIONS FOR ALL PATIENTS WITH MEMORY PROBLEMS: 1. Continue to exercise (Recommend 30 minutes of walking everyday, or 3 hours every week) 2. Increase social interactions - continue going to [place] and enjoy social gatherings with friends and family 3. Eat healthy, avoid fried foods and eat more fruits and vegetables 4. Maintain adequate blood pressure, blood sugar, and blood cholesterol level. Reducing the risk of stroke and cardiovascular disease also helps promoting better memory. 5. Avoid stressful situations. Live a simple life and avoid aggravations. Organize your time and prepare for the next day in anticipation. 6. Sleep well, avoid any interruptions of sleep and avoid any distractions in the bedroom that may interfere with adequate sleep quality 7. Avoid sugar, avoid sweets as there is a strong link between excessive sugar intake, diabetes, and cognitive impairment We discussed the Mediterranean diet, which has been shown to help patients reduce the risk of progressive memory disorders and reduces cardiovascular risk. This includes eating fish, eat fruits and green leafy vegetables, nuts like almonds and hazelnuts, walnuts, and also use olive oil. Avoid fast foods and fried foods as much as possible. Avoid sweets and sugar as sugar use has been linked to worsening of memory function.

## 2019-09-07 NOTE — Progress Notes (Signed)
NEUROLOGY CONSULTATION NOTE  ERIE HECKMAN MRN: [id number] DOB: [birth year]  Referring provider: Dr. Horald Pollen Primary care provider: Dr. Horald Pollen  Reason for consult:  Lapses in short-term memory and executive function  Dear Dr Darron Doom:  Thank you for your kind referral of [name] [name] for consultation of the above symptoms. Although his history is well known to you, please allow me to reiterate it for the purpose of our medical record. The patient was accompanied to the clinic by his wife who also provides collateral information. Records and images were personally reviewed where available.   HISTORY OF PRESENT ILLNESS: This is a pleasant 78 year old right-handed man with a history of hypertension, atrial fibrillation on Eliquis, Paget's disease, prostate cancer, colon cancer, presenting for evaluation of lapses in short term memory and executive function. He notes that his memory is not as good as it once was but has not noticed any impairment in functioning doing normal things he likes to do. His wife started noticing changes over the past year. She initially thought it was his hearing because she would need to repeat herself so much. There would be a disconnect, she would be talking about their daughter and he would ask who she was talking about. He would say she jumps around, which she denies. She states she really has to explain herself more. She has also noticed he is now very short-tempered, which he never has been. Family and friends have also picked up on it. Despite getting hearing aids, she has not noticed much change. His wife reports that Dr. Darron Doom had also expressed concern (notes requested for review) and ordered the MRI brain which showed an old small right frontal cortical infarct with encephalomalacia, otherwise signal in brain parenchyma normal. He denies any prior history of stroke symptoms. He continues to drive and denies getting lost. His wife manages  finances, but he does Scientist, clinical (histocompatibility and immunogenetics) and uses a spreadsheet with no difficulties. He also does writing without issues. He denies missing medications. His wife concurs. He denies misplacing things frequently or leaving the stove on. He does note he is a little more irritable but mood is generally good and trying to be positive. No paranoia or hallucinations. His father had memory issues in his late 71s. No history of significant head injuries. He occasionally drinks alcohol.  He denies any headaches, dizziness, diplopia, dysarthria/dysphagia, focal numbness/tingling/weakness, bowel/bladder dysfunction, anosmia, or tremors. He has occasional neck and back pain. He does not sleep well, with difficulty with sleep maintenance. He feels refreshed upon awakening but usually needs a nap in the afternoon. His wife reports he moves slowly.   Laboratory Data: Lab Results  Component Value Date   TSH 4.729 (H) 07/03/2019     PAST MEDICAL HISTORY: Past Medical History:  Diagnosis Date   Arthritis    Basal cell carcinoma of cheek    Cerumen impaction 06/05/2016   CHF (congestive heart failure) ([place])    history of this   Colon cancer ([place]) 10/27/2016   Found on flex sig 0000000   Complication of anesthesia    at 09/2016-had high BP with colonoscopy.   Erosive esophagitis 2014   by EGD [name])   Paget's disease    Dr. [name] [facility])   Paroxysmal atrial fibrillation [facility])    Prostate cancer [facility]) 1998   s/p seed implants [name]) in remission    PAST SURGICAL HISTORY: Past Surgical History:  Procedure Laterality Date   BASAL CELL CARCINOMA EXCISION  CARDIAC CATHETERIZATION  2007   No significant CAD   CARDIOVERSION  2010   A-Fib   COLONOSCOPY  01/1999   radiation proctitis, rec rpt 5 yrs ([place])   COLONOSCOPY  09/2016   invasive adenocacinoma araising in TVA - referred to surgery, radiation proctitis, rpt 1 yr [name])   ESOPHAGOGASTRODUODENOSCOPY N/A  02/24/2013   esoph stricture dilated, marked erosive esophagitis; Surgeon: [name], MD   FLEXIBLE SIGMOIDOSCOPY  10/2016   malignant tumor recto-sigmoid colon, diverticulosis - referred to surgery [name])   HAMMER TOE SURGERY  04/21/2012   amputation for paget's   [place]  2007   LAPAROSCOPIC SIGMOID COLECTOMY N/A 11/22/2016   path: no residual carcinoma, 13 benign LN, diverticulosis [name], MD)   [place]   Prostate cancer [name])   SAVORY DILATION N/A 02/24/2013   Procedure: Azzie Almas DILATION;  Surgeon: [name], MD;  Location: [facility] ENDOSCOPY;  Service: Endoscopy;  Laterality: N/A;   SHOULDER SURGERY Right 2004   RTC    MEDICATIONS: Current Outpatient Medications on File Prior to Visit  Medication Sig Dispense Refill   amiodarone (PACERONE) 200 MG tablet Take 1 tablet (200 mg total) by mouth daily as needed (a-fib). 90 tablet 3   apixaban (ELIQUIS) 5 MG TABS tablet Take 1 tablet (5 mg total) by mouth 2 (two) times daily. 180 tablet 3   cholecalciferol (VITAMIN D) 1000 UNITS tablet Take 5,000 Units by mouth every morning.      diltiazem (CARDIZEM CD) 120 MG 24 hr capsule Take 1 capsule (120 mg total) by mouth daily. 90 capsule 3   fish oil-omega-3 fatty acids 1000 MG capsule Take 1 g by mouth every morning.      glucosamine-chondroitin 500-400 MG tablet Take 1 tablet by mouth daily.     HYDROcodone-acetaminophen (NORCO/VICODIN) 5-325 MG tablet Take 1 tablet by mouth every 4 (four) hours as needed for moderate pain. 30 tablet 0   lisinopril (ZESTRIL) 20 MG tablet Take 1 tablet (20 mg total) by mouth daily. 90 tablet 3   Magnesium 400 MG CAPS Take 400 mg by mouth every morning.      Multiple Vitamin (MULTIVITAMIN WITH MINERALS) TABS Take 1 tablet by mouth every morning.     NON FORMULARY 2 scoop 2 (two) times daily as needed (calm plus - when in A-fib). Reported on 05/28/2016     NON FORMULARY Take 1,000 mg  by mouth daily. Tumeric     RESVERATROL PO Take 20 mg by mouth daily.     Turmeric (QC TUMERIC COMPLEX PO) Take by mouth.     vitamin C (ASCORBIC ACID) 500 MG tablet Take 1,000 mg by mouth 2 (two) times daily.      No current facility-administered medications on file prior to visit.     ALLERGIES: No Known Allergies  FAMILY HISTORY: Family History  Problem Relation Age of Onset   Arthritis Mother    Arrhythmia Mother    Diabetes Mother    Stroke Father    Cancer Brother        prostate   CAD Neg Hx     SOCIAL HISTORY: Social History   Socioeconomic History   Marital status: Married    Spouse name: Not on file   Number of children: 2   Years of education: Not on file   Highest education level: Not on file  Occupational History   Not on file  Social Needs   Financial  resource strain: Not on file   Food insecurity    Worry: Not on file    Inability: Not on file   Transportation needs    Medical: Not on file    Non-medical: Not on file  Tobacco Use   Smoking status: Former Smoker    Years: 20.00    Types: Cigars    Quit date: 03/11/2009    Years since quitting: 10.4   Smokeless tobacco: Never Used  Substance and Sexual Activity   Alcohol use: Yes    Alcohol/week: 1.0 standard drinks    Types: 1 Standard drinks or equivalent per week    Comment: Occasional   Drug use: No   Sexual activity: Yes    Partners: Female    Birth control/protection: Sponge  Lifestyle   Physical activity    Days per week: Not on file    Minutes per session: Not on file   Stress: Not on file  Relationships   Social connections    Talks on phone: Not on file    Gets together: Not on file    Attends religious service: Not on file    Active member of club or organization: Not on file    Attends meetings of clubs or organizations: Not on file    Relationship status: Not on file   Intimate partner violence    Fear of current or ex partner: Not on file     Emotionally abused: Not on file    Physically abused: Not on file    Forced sexual activity: Not on file  Other Topics Concern   Not on file  Social History Narrative   Married, retired, gets regular exercise.   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, golfing   Diet: good water, fruits/vegetables daily    REVIEW OF SYSTEMS: Constitutional: No fevers, chills, or sweats, no generalized fatigue, change in appetite Eyes: No visual changes, double vision, eye pain Ear, nose and throat: No hearing loss, ear pain, nasal congestion, sore throat Cardiovascular: No chest pain, palpitations Respiratory:  No shortness of breath at rest or with exertion, wheezes GastrointestinaI: No nausea, vomiting, diarrhea, abdominal pain, fecal incontinence Genitourinary:  No dysuria, urinary retention or frequency Musculoskeletal:  +occl neck pain, back pain Integumentary: No rash, pruritus, skin lesions Neurological: as above Psychiatric: No depression, insomnia, anxiety Endocrine: No palpitations, fatigue, diaphoresis, mood swings, change in appetite, change in weight, increased thirst Hematologic/Lymphatic:  No anemia, purpura, petechiae. Allergic/Immunologic: no itchy/runny eyes, nasal congestion, recent allergic reactions, rashes  PHYSICAL EXAM: Vitals:   09/07/19 1024  BP: (!) 153/65  Pulse: (!) 59  SpO2: 97%   General: No acute distress Head:  Normocephalic/atraumatic Skin/Extremities: No rash, no edema Neurological Exam: Mental status: alert and oriented to person, place, and time, no dysarthria or aphasia, Fund of knowledge is appropriate.  Recent and remote memory are intact.  Attention and concentration are normal.    [employer] Mental Exam 09/07/2019  Weekday Correct 1  Current year 1  What state are we in? 1  Amount spent 1    Amount left 0  # of Animals 3  5 objects recall 4  Number series 2  Hour markers 2  Time correct 2  Placed X in triangle correctly 1  Largest Figure 1  Name of male  2  Date back to work 0  Type of work 2  State she lived in 2  Total score 25   Cranial nerves: CN I: not tested CN II: pupils equal, round and reactive to light, visual fields intact CN III, IV, VI:  full range of motion, no nystagmus, no ptosis CN V: facial sensation intact CN VII: upper and lower face symmetric CN VIII: hearing intact to conversation CN IX, X: gag intact, uvula midline CN XI: sternocleidomastoid and trapezius muscles intact CN XII: tongue midline Bulk & Tone: normal, no fasciculations, no cogwheeling Motor: 5/5 throughout with no pronator drift. Sensation: intact to light touch, cold, pin, vibration and joint position sense.  No extinction to double simultaneous stimulation.  Romberg test slight sway Deep Tendon Reflexes: +2 throughout except for absent ankle jerks bilaterally, no ankle clonus Plantar responses: downgoing bilaterally Cerebellar: no incoordination on finger to nose testing Gait: narrow-based and steady, mild difficulty with tandem walk Tremor: none No bradykinesia noted, good finger taps   IMPRESSION: This is a pleasant 78 year old right-handed man with a history of hypertension, atrial fibrillation on Eliquis, Paget's disease, prostate cancer, colon cancer, presenting for evaluation of memory changes over the past year. His neurological exam is non-focal, SLUMS score today 25/30. No difficulties with complex tasks. MRI brain showed a small right frontal infarct that he is asymptomatic from. We discussed the diagnosis of Mild Cognitive Impairment (MCI), which means there are cognitive problems by report and testing but the patient is functioning normally. We discussed that around 50% of MCI patients progress to dementia (functional impairment) over 5 years. Dementia implies that  ADLs are currently compromised, his wife denies this. We discussed doing Neurocognitive testing to further evaluate cognitive issues, he would like to hold off for now. We discussed different causes of memory changes, check B12 level as well. We discussed that there is no evidence that medications such as Donepezil have any significant effect in MCI and sometimes side effects are hard to tolerate. They can alter the slope of progression for a short period of time in dementia and patients may have better function longer. He would like to hold off for now. We discussed the importance of control of vascular risk factors, physical exercise, and brain stimulation exercises for brain health. Follow-up in 6 months, they know to call for any changes.    Thank you for allowing me to participate in the care of this patient. Please do not hesitate to call for any questions or concerns.   Ellouise Newer, M.D.  CC: Dr. Darron Doom

## 2019-09-08 ENCOUNTER — Telehealth: Payer: Self-pay

## 2019-09-08 LAB — VITAMIN B12: Vitamin B-12: 702 pg/mL (ref 200–1100)

## 2019-09-08 NOTE — Telephone Encounter (Signed)
-----   Message from Cameron Sprang, MD sent at 09/08/2019  8:34 AM EDT ----- Pls let him know B12 level is normal, thanks

## 2019-09-08 NOTE — Telephone Encounter (Signed)
Pt's wife informed of B12 results

## 2019-10-04 NOTE — Progress Notes (Signed)
Cardiology Office Note  Date:  10/05/2019   ID:  [name] [name], DOB [birth year], MRN [id number]  PCP:  Hayden Rasmussen, MD   Chief Complaint  Patient presents with  . other    3 month f/u discuss cardiac results with pt. Meds reviewed verbally with pt.    HPI:  [name] is a 78 year old gentleman, history of  padgets, toe amputation on the left   hyperlipidemia ,  cardiac catheterization in June 2007 that showed no significant coronary artery disease. paroxysmal atrial fibrillation with cardioversion in December of 2010,  Continued episodes of paroxysmal atrial fibrillation, previously declined anticoagulation and preferred to take pradaxa as needed  stress at home, daughter had cancer, died in 14-Oct-2015 Partial colectomy 11/2016 Noncompliance on eliquis, Chads Vasc of  3  Dilated aorta 4 cm who presents for routine followup of his atrial fibrillation  Doing well, Lots of questions to discuss on today's visit Active, using chain saw, hit his leg Large ecchymotic bruise below the knee on the right  Active, exercising Plays golf  Rare palpitations, denies any tachycardia  Lots of questions concerning all the testing he had as detailed below  Zio discussed with him  sinus Rhythm.  --82 Supraventricular Tachycardia runs occurred,the longest lasting 18 beats with an avg rate of 144 bpm.  Isolated VEs were frequent (12.5%, [id number]), VE Couplets were rare (<1.0%, 927), and VE Triplets were rare (<1.0%, 13).  Ventricular Bigeminy and Trigeminy were present.  Echo 07/17/2019, results discussed with him 1. The left ventricle has normal systolic function with an ejection fraction of 60-65%.  dilatation of the aortic root and of the ascending aorta 4.0 cm.   Stress test performed with no significant ischemia Mild aortic atherosclerosis seen on CT, images pulled up and discussed with him  Denies having any atrial fibrillation spells  EKG personally reviewed by myself on  todays visit Shows normal sinus rhythm rate 62 bpm no significant ST-T wave changes  Total cholesterol 193 LDL 126  Other past medical history reviewed 5 episodes of atrial fib, most recently in 13-Oct-2018 Often lasting >15 hours Takes amiodarone as needed, 400 PRN  Chads Vasc of  3   episode of atrial fibrillation 06/11/2014. This lasted less than one day. He does take amiodarone as needed to restore normal sinus rhythm.  Since the event at the end of July, he is taking metoprolol 12.5 mg twice a day He also takes extra metoprolol for breakthrough arrhythmia  Previous episodes of atrial fibrillation in 01/21/2013 lasting 15 hours and 06/26/2015 lasting less than 24 hours.  In August 2014, he had had his esophagus stretched and he feels that this attributed to his recurrent arrhythmia.   cardiac catheterization in June 2007 that showed no significant coronary artery disease. Ejection fraction at that time was 45-55% and he was in atrial fibrillation with a rapid rate.    PMH:   has a past medical history of Arthritis, Basal cell carcinoma of cheek, Cerumen impaction (06/05/2016), CHF (congestive heart failure) ([place]), Colon cancer ([place]) ([id number]), Complication of anesthesia, Erosive esophagitis (2014), Paget's disease, Paroxysmal atrial fibrillation ([place]), and Prostate cancer ([place]) (1998).  PSH:    Past Surgical History:  Procedure Laterality Date  . BASAL CELL CARCINOMA EXCISION    . CARDIAC CATHETERIZATION  2007   No significant CAD  . CARDIOVERSION  2010   A-Fib  . COLONOSCOPY  01/1999   radiation proctitis, rec rpt 5 yrs ([facility])  . COLONOSCOPY  October 13, 2016  invasive adenocacinoma araising in TVA - referred to surgery, radiation proctitis, rpt 1 yr [name] [name])  . ESOPHAGOGASTRODUODENOSCOPY N/A 02/24/2013   esoph stricture dilated, marked erosive esophagitis; Surgeon: [name], MD  . FLEXIBLE SIGMOIDOSCOPY  10/2016   malignant tumor recto-sigmoid colon, diverticulosis  - referred to surgery [name] [name])  . HAMMER TOE SURGERY  04/21/2012   amputation for paget's  . INGUINAL HERNIA REPAIR  2007  . LAPAROSCOPIC SIGMOID COLECTOMY N/A 11/22/2016   path: no residual carcinoma, 13 benign LN, diverticulosis [name] Hausen, MD)  . RADIOACTIVE SEED IMPLANT  1998   Prostate cancer [name] [name])  . SAVORY DILATION N/A 02/24/2013   Procedure: SAVORY DILATION;  Surgeon: [name], MD;  Location: [facility];  Service: Endoscopy;  Laterality: N/A;  . SHOULDER SURGERY Right 2004   RTC    Current Outpatient Medications  Medication Sig Dispense Refill  . amiodarone (PACERONE) 200 MG tablet Take 1 tablet (200 mg total) by mouth daily as needed (a-fib). 90 tablet 3  . apixaban (ELIQUIS) 5 MG TABS tablet Take 1 tablet (5 mg total) by mouth 2 (two) times daily. 180 tablet 3  . cholecalciferol (VITAMIN D) 1000 UNITS tablet Take 5,000 Units by mouth every morning.     . diltiazem (CARDIZEM CD) 120 MG 24 hr capsule Take 1 capsule (120 mg total) by mouth daily. 90 capsule 3  . fish oil-omega-3 fatty acids 1000 MG capsule Take 1 g by mouth every morning.     Marland Kitchen glucosamine-chondroitin 500-400 MG tablet Take 1 tablet by mouth daily.    Marland Kitchen HYDROcodone-acetaminophen (NORCO/VICODIN) 5-325 MG tablet Take 1 tablet by mouth every 4 (four) hours as needed for moderate pain. 30 tablet 0  . lisinopril (ZESTRIL) 20 MG tablet Take 1 tablet (20 mg total) by mouth daily. 90 tablet 3  . Magnesium 400 MG CAPS Take 400 mg by mouth every morning.     . Multiple Vitamin (MULTIVITAMIN WITH MINERALS) TABS Take 1 tablet by mouth every morning.    . NON FORMULARY 2 scoop 2 (two) times daily as needed (calm plus - when in A-fib). Reported on 05/28/2016    . RESVERATROL PO Take 20 mg by mouth daily.    . Turmeric (QC TUMERIC COMPLEX PO) Take by mouth.    . vitamin C (ASCORBIC ACID) 500 MG tablet Take 1,000 mg by mouth 2 (two) times daily.      No current facility-administered medications for this visit.       Allergies:   Patient has no known allergies.   Social History:  The patient  reports that he quit smoking about 10 years ago. His smoking use included cigars. He quit after 20.00 years of use. He has never used smokeless tobacco. He reports current alcohol use of about 1.0 standard drinks of alcohol per week. He reports that he does not use drugs.   Family History:   family history includes Arrhythmia in his mother; Arthritis in his mother; Cancer in his brother; Diabetes in his mother; Stroke in his father.    Review of Systems: Review of Systems  Constitutional: Negative.   Respiratory: Negative.   Cardiovascular: Positive for palpitations.  Gastrointestinal: Negative.   Musculoskeletal: Negative.   Neurological: Negative.   Psychiatric/Behavioral: Negative.   All other systems reviewed and are negative.   PHYSICAL EXAM: VS:  BP 130/60 (BP Location: Left Arm, Patient Position: Sitting, Cuff Size: Normal)   Pulse 62   Ht 5\' 11"  (1.803 m)  Wt 194 lb 4 oz (88.1 kg)   SpO2 98%   BMI 27.09 kg/m  , BMI Body mass index is 27.09 kg/m. Constitutional:  oriented to person, place, and time. No distress.  HENT:  Head: Grossly normal Eyes:  no discharge. No scleral icterus.  Neck: No JVD, no carotid bruits  Cardiovascular: Regular rate and rhythm, no murmurs appreciated Pulmonary/Chest: Clear to auscultation bilaterally, no wheezes or rails Abdominal: Soft.  no distension.  no tenderness.  Musculoskeletal: Normal range of motion Neurological:  normal muscle tone. Coordination normal. No atrophy Skin: Skin warm and dry Psychiatric: normal affect, pleasant  Recent Labs: 07/03/2019: Hemoglobin 14.7; Magnesium 2.3; Platelets 172; TSH 4.729 08/07/2019: BUN 19; Creatinine, Ser 1.03; Potassium 4.3; Sodium 142    Lipid Panel Lab Results  Component Value Date   CHOL 187 05/28/2016   HDL 60.60 05/28/2016   LDLCALC 116 (H) 05/28/2016   TRIG 51.0 05/28/2016      Wt Readings  from Last 3 Encounters:  10/05/19 194 lb 4 oz (88.1 kg)  09/07/19 199 lb 2 oz (90.3 kg)  07/31/19 197 lb (89.4 kg)       ASSESSMENT AND PLAN:  Paroxysmal atrial fibrillation (HCC) - eliquis back to 5 twice a day No atrial fib on Zio monitor, no sx of palpitations Monitor was reviewed with him in detail He does not want to treat his short runs of SVT.  Reports he is asymptomatic  Mixed hyperlipidemia - Plan: EKG 12-Lead Lipid panel done through primary care, total cholesterol 180 He does not want a statin or Zetia He does have mild aortic atherosclerosis seen on CT scan  Paget's disease of bone - Plan: EKG 12-Lead Elevated alkaline phosphatase, being monitored  History of smoking - Plan: EKG 12-Lead Recommended smoking cessation  Aortic atherosclerosis Mild on CT, images pulled up and discussed  Mildly dilated ascending aorta 4 cm, consider repeat imaging study next year Details discussed with him  Long discussion concerning atrial fibrillation and anticoagulation  Total encounter time more than 25 minutes  Greater than 50% was spent in counseling and coordination of care with the patient   Disposition:   F/U  12 months   Orders Placed This Encounter  Procedures  . EKG 12-Lead     Signed, Esmond Plants, M.D., Ph.D. 10/05/2019  [place], [place]

## 2019-10-05 ENCOUNTER — Other Ambulatory Visit: Payer: Self-pay

## 2019-10-05 ENCOUNTER — Encounter: Payer: Self-pay | Admitting: Cardiovascular Disease

## 2019-10-05 ENCOUNTER — Ambulatory Visit (INDEPENDENT_AMBULATORY_CARE_PROVIDER_SITE_OTHER): Payer: Medicare Other | Admitting: Cardiovascular Disease

## 2019-10-05 VITALS — BP 130/60 | HR 62 | Ht 71.0 in | Wt 194.2 lb

## 2019-10-05 DIAGNOSIS — Z87891 Personal history of nicotine dependence: Secondary | ICD-10-CM | POA: Diagnosis not present

## 2019-10-05 DIAGNOSIS — I10 Essential (primary) hypertension: Secondary | ICD-10-CM

## 2019-10-05 DIAGNOSIS — E785 Hyperlipidemia, unspecified: Secondary | ICD-10-CM

## 2019-10-05 DIAGNOSIS — I480 Paroxysmal atrial fibrillation: Secondary | ICD-10-CM | POA: Diagnosis not present

## 2019-10-05 DIAGNOSIS — F419 Anxiety disorder, unspecified: Secondary | ICD-10-CM

## 2019-10-05 DIAGNOSIS — I471 Supraventricular tachycardia: Secondary | ICD-10-CM

## 2019-10-05 MED ORDER — METOPROLOL TARTRATE 25 MG PO TABS: 25.0000 mg | ORAL_TABLET | Freq: Two times a day (BID) | ORAL | 1 refills | Status: DC | PRN

## 2019-10-05 MED ORDER — AMIODARONE HCL 200 MG PO TABS: 200.0000 mg | ORAL_TABLET | Freq: Every day | ORAL | 3 refills | Status: DC | PRN

## 2019-10-05 NOTE — Patient Instructions (Addendum)

## 2019-10-06 DIAGNOSIS — H25813 Combined forms of age-related cataract, bilateral: Secondary | ICD-10-CM | POA: Diagnosis not present

## 2019-10-06 DIAGNOSIS — H02831 Dermatochalasis of right upper eyelid: Secondary | ICD-10-CM | POA: Diagnosis not present

## 2019-10-06 DIAGNOSIS — H43813 Vitreous degeneration, bilateral: Secondary | ICD-10-CM | POA: Diagnosis not present

## 2019-10-06 DIAGNOSIS — H02834 Dermatochalasis of left upper eyelid: Secondary | ICD-10-CM | POA: Diagnosis not present

## 2019-10-13 ENCOUNTER — Ambulatory Visit: Payer: Medicare Other | Admitting: Neurology

## 2019-10-22 ENCOUNTER — Telehealth: Payer: Self-pay | Admitting: Cardiovascular Disease

## 2019-10-22 NOTE — Telephone Encounter (Signed)
Patient calling Wants to know if Dr Rockey Situ will approve a switch from Eliquis medication to Plavix If so would like prescription sent in to [facility] in [place] Please call to discuss

## 2019-10-22 NOTE — Telephone Encounter (Signed)
He need eliquis for atrial fib Plavix is not used for atrial fib. He might have a stroke

## 2019-10-23 NOTE — Telephone Encounter (Signed)
Left voicemail message to call back  

## 2019-10-23 NOTE — Telephone Encounter (Signed)
Spoke with patient and reviewed recommendations by provider to continue Eliquis for his atrial fibrillation and reduce risk of stroke. Reviewed the difference and reasons Dr. Rockey Situ recommend he stay on that medication. He verbalized understanding and was agreeable with plan.

## 2019-10-23 NOTE — Telephone Encounter (Signed)
No answer/No voicemail x 2

## 2019-10-23 NOTE — Telephone Encounter (Signed)
Patient returning call.

## 2019-10-27 ENCOUNTER — Telehealth: Payer: Self-pay | Admitting: Cardiovascular Disease

## 2019-10-27 NOTE — Telephone Encounter (Signed)
Spoke with the patient. Patient sts that he was having blood in his urine, nose bleeds, and bright red blood in his stool. Patient sts that he has held 3 doses of Elquis and the bleeding had resolved.  Patient is requesting to speak with Dr. Rockey Situ directly. Advise the patient that Dr. Rockey Situ is currently in clinic and unavailable. Advised the patient that I will fwd his questions/ concerns to Dr. Rockey Situ and we will call back with his response.  -Patient wants to know if he can decrease his dosage of Elquis to 5mg  daily or 2.5mg  bid. He thinks his current dosing of Eliquis  5mg  bid is to high.  I advised the patient that Eliquis needs to be bid to ensure therapeutic effect and that the (mg) dosage is determined by age, weight, kidney function. I advised the patient that I will assume that Dr. Rockey Situ will recommend he resume Eliquis asap. The patient is wanting Dr. Donivan Scull recommendation before restarting Eliquis. If Eliquis is going to be continued the patient will need an Rx sent to [facility].

## 2019-10-27 NOTE — Telephone Encounter (Signed)
Pt c/o medication issue:  1. Name of Medication: Eliquis   2. How are you currently taking this medication (dosage and times per day)? Holding 5 mg po BID   3. Are you having a reaction (difficulty breathing--STAT)? Bleeding per Urine stool and nose stopped in last 24 hrs since holding eliquis   4. What is your medication issue?  Patient concerned dose too much and wants to discuss bleeding and changing

## 2019-10-28 NOTE — Telephone Encounter (Signed)
Left voicemail message to call back  

## 2019-10-28 NOTE — Telephone Encounter (Signed)
Because he is normal weight with normal kidneys Low-dose Eliquis would be inadequate As previously discussed Plavix is not a substitute and puts him at risk of stroke Would recommend Eliquis 5 twice a day for stroke prevention If he is having bleeding he needs to follow-up with primary care to work-up hematuria, blood in his stool

## 2019-10-28 NOTE — Telephone Encounter (Signed)
Patient calling back, will be at home the rest of the evening to be called back

## 2019-10-29 NOTE — Telephone Encounter (Signed)
Patient returning call.

## 2019-10-29 NOTE — Telephone Encounter (Signed)
Patient calling to check on status.

## 2019-10-29 NOTE — Telephone Encounter (Signed)
No answer. Left message to call back on home and mobile number. 

## 2019-10-30 MED ORDER — APIXABAN 5 MG PO TABS: 5.0000 mg | ORAL_TABLET | Freq: Two times a day (BID) | ORAL | 3 refills | Status: DC

## 2019-10-30 NOTE — Addendum Note (Signed)
Addended by: Valora Corporal on: 10/30/2019 10:46 AM   Modules accepted: Orders

## 2019-10-30 NOTE — Telephone Encounter (Signed)
Had lengthy discussion with patient about Eliquis and his concerns. Instructed him to restart his medication and if bleeding returns to follow up with his PCP office for further workup. He wanted to know about lower dose and if we needed to check his INR. Reviewed that Eliquis is dosed based on weight and certain lab levels. Reviewed that INR is not checked when on Eliquis and that other medications are not effective coverage for him to reduce risk of stroke. Then he wanted to know about how to manage the bleeding. He states that he no longer has bleeding in urine or stool but does have nose bleeds from time to time. Reviewed that if bleeding returns he will need to check with PCP for further work up to determine what is causing this bleeding. Also reviewed that with this time of year nose bleeds are frequent and he can try some over the counter nasal saline spray and if bleeding then over the counter afrin if needed. He then reports that he feels his metabolism is the reason for this and that he should be on a lower dose. Advised that lower dose would be subtherapeutic and would not provide the coverage necessary to prevent potential stroke. Confirmed upcoming scheduled appointment and if he should have any further questions or problems to please call back and we can try to have him come in sooner to discuss with provider. He verbalized understanding of our conversation, agreement with plan, and had no further questions at this time.

## 2020-03-07 ENCOUNTER — Ambulatory Visit: Payer: Medicare Other | Admitting: Neurology

## 2020-03-25 ENCOUNTER — Telehealth: Payer: Self-pay | Admitting: Cardiovascular Disease

## 2020-03-25 NOTE — Telephone Encounter (Signed)
Would proceed with second vaccine Benefit outweighs risk If he develops atrial fibrillation again he has metoprolol and  amiodarone Would make sure he is taking Eliquis twice a day

## 2020-03-25 NOTE — Telephone Encounter (Signed)
The patient called today stating he had his first COVID vaccine on 03/03/20- he noticed an episode of atrial fibrillation the day after. Prior to that, his last episode of intermittent a-fib was ~ 7 months ago.   The patient's concern today is that he is due to take his 2nd Vaccine on Monday 5/17 @ 10am. He is concerned that this may trigger his atrial fibrillation again.  I advised the patient I could not confirm or deny that his episode of atrial fibrillation the day after his 1st vaccine was related to his shot. I did confirm with the patient he has PRN amiodarone & metoprolol to take. He is on eliquis BID & diltiazem QD. I advised I did not think Dr. Rockey Situ would advise him to do anything differently, but would check with him for any recommendations prior to his 2nd vaccine scheduled for Monday.   The patient voices understanding and is agreeable.

## 2020-03-25 NOTE — Telephone Encounter (Signed)
.  Patient c/o Palpitations:  High priority if patient c/o lightheadedness, shortness of breath, or chest pain  1) How long have you had palpitations/irregular HR/ Afib? Are you having the symptoms now?    Started after covid vaccine a couple of weeks ago   2) Are you currently experiencing lightheadedness, SOB or CP?   afib / palpitations  3) Do you have a history of afib (atrial fibrillation) or irregular heart rhythm? yes  4) Have you checked your BP or HR? (document readings if available):  Normal patient says is current ecg is normal  5) Are you experiencing any other symptoms?       patient believes the recent moderna vaccine may have caused some afib and is due Monday for the 2nd dose but wants to discuss before getting incase this is not safe  Please call to discuss

## 2020-03-25 NOTE — Telephone Encounter (Signed)
I attempted to call the patient. His wife answered the phone and stated he had just left. She knew what I was calling in reference to and DPR on file- ok to speak with her.  I advised her of Dr. Donivan Scull recommendations as stated below. I had confirmed earlier with the patient he was taking his eliquis BID.  The patient's wife voices understanding of Dr. Donivan Scull recommendations and will notify the patient.

## 2020-04-12 ENCOUNTER — Ambulatory Visit: Payer: Medicare Other | Admitting: Cardiovascular Disease

## 2020-06-28 ENCOUNTER — Telehealth: Payer: Self-pay | Admitting: Cardiovascular Disease

## 2020-06-28 NOTE — Telephone Encounter (Signed)
*  STAT* If patient is at the pharmacy, call can be transferred to refill team.   1. Which medications need to be refilled? (please list name of each medication and dose if known) Cardizem 120 mg  2. Which pharmacy/location (including street and city if local pharmacy) is medication to be sent to? [facility] in [place]  3. Do they need a 30 day or 90 day supply? [place]

## 2020-06-28 NOTE — Telephone Encounter (Signed)
*  STAT* If patient is at the pharmacy, call can be transferred to refill team.   1. Which medications need to be refilled? (please list name of each medication and dose if known) lisinopril 20 mg  2. Which pharmacy/location (including street and city if local pharmacy) is medication to be sent to?[facility]  3. Do they need a 30 day or 90 day supply? [place]

## 2020-06-29 ENCOUNTER — Other Ambulatory Visit: Payer: Self-pay | Admitting: *Deleted

## 2020-06-29 MED ORDER — DILTIAZEM HCL ER COATED BEADS 120 MG PO CP24: 120.0000 mg | ORAL_CAPSULE | Freq: Every day | ORAL | 0 refills | Status: DC

## 2020-06-29 MED ORDER — LISINOPRIL 20 MG PO TABS: 20.0000 mg | ORAL_TABLET | Freq: Every day | ORAL | 0 refills | Status: DC

## 2020-06-29 NOTE — Telephone Encounter (Signed)
Rx's have been sent to pharmacy as requested.

## 2020-07-13 ENCOUNTER — Telehealth: Payer: Self-pay | Admitting: Cardiovascular Disease

## 2020-07-13 NOTE — Telephone Encounter (Signed)
Patient calling Patient had an ECHO last year and recalls we mentioned that he had a gallstone  Patient would like to clarify if that was so - he is experiencing some pain and would like to know who we would recommend to follow up on that  Please call to discuss

## 2020-07-13 NOTE — Telephone Encounter (Signed)
Patient spoke his gastrologist who thinks patient has a gallstone. He has appointment with doctor tomorrow. Nothing else needed from [place] at this time. He will call back if anything from [place] is needed sooner. Patient is aware of upcoming appointment scheduled for in November.

## 2020-07-25 ENCOUNTER — Other Ambulatory Visit: Payer: Self-pay | Admitting: Gastroenterology

## 2020-07-25 ENCOUNTER — Other Ambulatory Visit (HOSPITAL_COMMUNITY): Payer: Self-pay | Admitting: Gastroenterology

## 2020-07-25 DIAGNOSIS — R748 Abnormal levels of other serum enzymes: Secondary | ICD-10-CM

## 2020-08-25 ENCOUNTER — Other Ambulatory Visit: Payer: Self-pay | Admitting: Cardiovascular Disease

## 2020-08-25 NOTE — Telephone Encounter (Signed)
Please review for refill. Thanks!  

## 2020-09-15 DIAGNOSIS — H2512 Age-related nuclear cataract, left eye: Secondary | ICD-10-CM | POA: Diagnosis not present

## 2020-09-30 NOTE — Progress Notes (Signed)
Cardiology Office Note  Date:  October 18, 2020   ID:  [name] [name], DOB 1941/07/17, MRN [id number]  PCP:  Hayden Rasmussen, MD   Chief Complaint  Patient presents with  . Annual Exam    Pt states he had cataract surgery in left eye November 5, right eye done last thursday--states no new cardiac Sx.    HPI:  [name] is a 79 year old gentleman, history of  padgets, toe amputation on the left   hyperlipidemia ,  cardiac catheterization in June 2007 that showed no significant coronary artery disease. paroxysmal atrial fibrillation with cardioversion in December of 2010,  Continued episodes of paroxysmal atrial fibrillation, previously declined anticoagulation and preferred to take pradaxa as needed  stress at home, daughter had cancer, died in 10-19-15 Partial colectomy 11/2016 Noncompliance on eliquis, Chads Vasc of  3  Dilated aorta 4 cm  who presents for routine followup of his atrial fibrillation  April 2021, episode of atrial fib At time of vaccine, Lasted 10 hours  Working on the farm Doing well  Numerous supplements on his medication list Reports compliance with the Cardizem, Eliquis twice a day, lisinopril  Sometimes takes Eliquis once a day  Reports if he gets Covid would like to take ivermectin protocol with supplements as he has read on the Internet  EKG personally reviewed by myself on todays visit Normal sinus rhythm rate 58 beats per minute, rare APC  Other past medical history reviewed Zio discussed with him  sinus Rhythm.  --82 Supraventricular Tachycardia runs occurred,the longest lasting 18 beats with an avg rate of 144 bpm.  Isolated VEs were frequent (12.5%, [id number]), VE Couplets were rare (<1.0%, 927), and VE Triplets were rare (<1.0%, 13).  Ventricular Bigeminy and Trigeminy were present.  Echo 07/17/2019, results discussed with him 1. The left ventricle has normal systolic function with an ejection fraction of 60-65%.  dilatation of the  aortic root and of the ascending aorta 4.0 cm.  Stress test performed with no significant ischemia Mild aortic atherosclerosis seen on CT, images pulled up and discussed with him   5 episodes of atrial fib, most recently in 10-18-18 Often lasting >15 hours Previously took amiodarone as needed,   episode of atrial fibrillation 06/11/2014. This lasted less than one day. He does take amiodarone as needed to restore normal sinus rhythm.  Since the event at the end of July, he is taking metoprolol 12.5 mg twice a day He also takes extra metoprolol for breakthrough arrhythmia  Previous episodes of atrial fibrillation in 01/21/2013 lasting 15 hours and 06/26/2015 lasting less than 24 hours.  In August 2014, he had had his esophagus stretched and he feels that this attributed to his recurrent arrhythmia.   cardiac catheterization in June 2007 that showed no significant coronary artery disease. Ejection fraction at that time was 45-55% and he was in atrial fibrillation with a rapid rate.    PMH:   has a past medical history of Arthritis, Basal cell carcinoma of cheek, Cerumen impaction (06/05/2016), CHF (congestive heart failure) ([place]), Colon cancer ([place]) (06/00/4599), Complication of anesthesia, Erosive esophagitis (2014), Paget's disease, Paroxysmal atrial fibrillation ([place]), and Prostate cancer ([name] [name]) (1998).  PSH:    Past Surgical History:  Procedure Laterality Date  . BASAL CELL CARCINOMA EXCISION    . CARDIAC CATHETERIZATION  2007   No significant CAD  . CARDIOVERSION  2010   A-Fib  . COLONOSCOPY  01/1999   radiation proctitis, rec rpt 5 yrs ([place])  . COLONOSCOPY  09/2016   invasive adenocacinoma araising in TVA - referred to surgery, radiation proctitis, rpt 1 yr [name] [name])  . ESOPHAGOGASTRODUODENOSCOPY N/A 02/24/2013   esoph stricture dilated, marked erosive esophagitis; Surgeon: [name], MD  . FLEXIBLE SIGMOIDOSCOPY  10/2016   malignant tumor recto-sigmoid colon,  diverticulosis - referred to surgery [name] [name])  . HAMMER TOE SURGERY  04/21/2012   amputation for paget's  . INGUINAL HERNIA REPAIR  2007  . LAPAROSCOPIC SIGMOID COLECTOMY N/A 11/22/2016   path: no residual carcinoma, 13 benign LN, diverticulosis [name] Hausen, MD)  . RADIOACTIVE SEED IMPLANT  1998   Prostate cancer [name])  . SAVORY DILATION N/A 02/24/2013   Procedure: SAVORY DILATION;  Surgeon: [name], MD;  Location: [facility];  Service: Endoscopy;  Laterality: N/A;  . SHOULDER SURGERY Right 2004   RTC    Current Outpatient Medications  Medication Sig Dispense Refill  . cholecalciferol (VITAMIN D) 1000 UNITS tablet Take 5,000 Units by mouth every morning.     . diltiazem (CARDIZEM CD) 120 MG 24 hr capsule Take 1 capsule (120 mg total) by mouth daily. 90 capsule 0  . ELIQUIS 5 MG TABS tablet Take 1 tablet by mouth twice daily 180 tablet 1  . fish oil-omega-3 fatty acids 1000 MG capsule Take 1 g by mouth every morning.     Marland Kitchen glucosamine-chondroitin 500-400 MG tablet Take 1 tablet by mouth daily.    Marland Kitchen lisinopril (ZESTRIL) 20 MG tablet Take 1 tablet (20 mg total) by mouth daily. 90 tablet 0  . Magnesium 400 MG CAPS Take 400 mg by mouth every morning.     . Multiple Vitamin (MULTIVITAMIN WITH MINERALS) TABS Take 1 tablet by mouth every morning.    . NON FORMULARY 2 scoop 2 (two) times daily as needed (calm plus - when in A-fib). Reported on 05/28/2016    . NON FORMULARY Co-Q-10 30 mg/L-Carnitine-250 mg/Ashwagandha-475mg     . RESVERATROL PO Take 20 mg by mouth daily.    Marland Kitchen TART CHERRY PO Take by mouth. 500 mg    . Turmeric (QC TUMERIC COMPLEX PO) Take by mouth.    . vitamin C (ASCORBIC ACID) 500 MG tablet Take 1,000 mg by mouth 2 (two) times daily.      No current facility-administered medications for this visit.    Allergies:   Patient has no known allergies.   Social History:  The patient  reports that he quit smoking about 11 years ago. His smoking use included cigars.  He quit after 20.00 years of use. He has never used smokeless tobacco. He reports current alcohol use of about 1.0 standard drink of alcohol per week. He reports that he does not use drugs.   Family History:   family history includes Arrhythmia in his mother; Arthritis in his mother; Cancer in his brother; Diabetes in his mother; Stroke in his father.    Review of Systems: Review of Systems  Constitutional: Negative.   Respiratory: Negative.   Cardiovascular: Positive for palpitations.  Gastrointestinal: Negative.   Musculoskeletal: Negative.   Neurological: Negative.   Psychiatric/Behavioral: Negative.   All other systems reviewed and are negative.   PHYSICAL EXAM: VS:  BP (!) 144/62   Pulse (!) 58   Ht 5\' 11"  (1.803 m)   Wt 192 lb (87.1 kg)   BMI 26.78 kg/m  , BMI Body mass index is 26.78 kg/m. Constitutional:  oriented to person, place, and time. No distress.  HENT:  Head: Grossly normal Eyes:  no discharge. No scleral icterus.  Neck: No JVD, no carotid bruits  Cardiovascular: Regular rate and rhythm, no murmurs appreciated Pulmonary/Chest: Clear to auscultation bilaterally, no wheezes or rails Abdominal: Soft.  no distension.  no tenderness.  Musculoskeletal: Normal range of motion Neurological:  normal muscle tone. Coordination normal. No atrophy Skin: Skin warm and dry Psychiatric: normal affect, pleasant   Recent Labs: No results found for requested labs within last 8760 hours.    Lipid Panel Lab Results  Component Value Date   CHOL 187 05/28/2016   HDL 60.60 05/28/2016   LDLCALC 116 (H) 05/28/2016   TRIG 51.0 05/28/2016    Wt Readings from Last 3 Encounters:  10/03/20 192 lb (87.1 kg)  10/05/19 194 lb 4 oz (88.1 kg)  09/07/19 199 lb 2 oz (90.3 kg)     ASSESSMENT AND PLAN:  Paroxysmal atrial fibrillation (HCC) - eliquis  5 twice a day, reports sometimes takes 1 a day (for "nosebleed") Stay on diltiazem ER 120  Recommend he call for prolonged  episodes of arrhythmia  Mixed hyperlipidemia - He does not want a statin or Zetia mild aortic atherosclerosis seen on CT scan Lifestyle modification recommended  Paget's disease of bone - Prior history of elevated alkaline phosphatase, being monitored  History of smoking -  Recommended smoking cessation  Aortic atherosclerosis Mild on CT, previously discussed  Mildly dilated ascending aorta 4 cm, stable We will need periodic evaluation   Total encounter time more than 25 minutes  Greater than 50% was spent in counseling and coordination of care with the patient    No orders of the defined types were placed in this encounter.    Signed, Esmond Plants, M.D., Ph.D. 10/03/2020  [place], [place]

## 2020-10-03 ENCOUNTER — Ambulatory Visit (INDEPENDENT_AMBULATORY_CARE_PROVIDER_SITE_OTHER): Payer: Medicare Other | Admitting: Cardiovascular Disease

## 2020-10-03 ENCOUNTER — Encounter: Payer: Self-pay | Admitting: Cardiovascular Disease

## 2020-10-03 ENCOUNTER — Other Ambulatory Visit: Payer: Self-pay

## 2020-10-03 VITALS — BP 144/62 | HR 58 | Ht 71.0 in | Wt 192.0 lb

## 2020-10-03 DIAGNOSIS — I480 Paroxysmal atrial fibrillation: Secondary | ICD-10-CM | POA: Diagnosis not present

## 2020-10-03 DIAGNOSIS — E785 Hyperlipidemia, unspecified: Secondary | ICD-10-CM

## 2020-10-03 DIAGNOSIS — I471 Supraventricular tachycardia, unspecified: Secondary | ICD-10-CM

## 2020-10-03 DIAGNOSIS — Z87891 Personal history of nicotine dependence: Secondary | ICD-10-CM

## 2020-10-03 DIAGNOSIS — I10 Essential (primary) hypertension: Secondary | ICD-10-CM | POA: Diagnosis not present

## 2020-10-03 DIAGNOSIS — I4729 Other ventricular tachycardia: Secondary | ICD-10-CM

## 2020-10-03 DIAGNOSIS — I472 Ventricular tachycardia: Secondary | ICD-10-CM

## 2020-10-03 MED ORDER — DILTIAZEM HCL ER COATED BEADS 120 MG PO CP24: 120.0000 mg | ORAL_CAPSULE | Freq: Every day | ORAL | 3 refills | Status: DC

## 2020-10-03 MED ORDER — LISINOPRIL 20 MG PO TABS: 20.0000 mg | ORAL_TABLET | Freq: Every day | ORAL | 3 refills | Status: DC

## 2020-10-03 NOTE — Patient Instructions (Addendum)
Medication Instructions:  No changes  If you need a refill on your cardiac medications before your next appointment, please call your pharmacy.   Lab work: No new labs needed  If you have labs (blood work) drawn today and your tests are completely normal, you will receive your results only by: Marland Kitchen MyChart Message (if you have MyChart) OR . A paper copy in the mail If you have any lab test that is abnormal or we need to change your treatment, we will call you to review the results.   Testing/Procedures: No new testing needed   Follow-Up: At [facility], you and your health needs are our priority.  As part of our continuing mission to provide you with exceptional heart care, we have created designated Provider Care Teams.  These Care Teams include your primary Cardiologist (physician) and Advanced Practice Providers (APPs -  Physician Assistants and Nurse Practitioners) who all work together to provide you with the care you need, when you need it.  . You will need a follow up appointment in 12 months, APP ok  . Providers on your designated Care Team:   . Murray Hodgkins, NP . Christell Faith, PA-C . Marrianne Mood, PA-C  Any Other Special Instructions Will Be Listed Below (If Applicable).  COVID-19 Vaccine Information can be found at: ShippingScam.co.uk For questions related to vaccine distribution or appointments, please email [email or web address] or call [phone].

## 2021-01-24 DIAGNOSIS — Z85828 Personal history of other malignant neoplasm of skin: Secondary | ICD-10-CM | POA: Diagnosis not present

## 2021-01-24 DIAGNOSIS — D2262 Melanocytic nevi of left upper limb, including shoulder: Secondary | ICD-10-CM | POA: Diagnosis not present

## 2021-01-24 DIAGNOSIS — L905 Scar conditions and fibrosis of skin: Secondary | ICD-10-CM | POA: Diagnosis not present

## 2021-01-24 DIAGNOSIS — L570 Actinic keratosis: Secondary | ICD-10-CM | POA: Diagnosis not present

## 2021-01-24 DIAGNOSIS — D1801 Hemangioma of skin and subcutaneous tissue: Secondary | ICD-10-CM | POA: Diagnosis not present

## 2021-01-24 DIAGNOSIS — L821 Other seborrheic keratosis: Secondary | ICD-10-CM | POA: Diagnosis not present

## 2021-01-24 DIAGNOSIS — L84 Corns and callosities: Secondary | ICD-10-CM | POA: Diagnosis not present

## 2021-01-24 DIAGNOSIS — C44629 Squamous cell carcinoma of skin of left upper limb, including shoulder: Secondary | ICD-10-CM | POA: Diagnosis not present

## 2021-01-24 DIAGNOSIS — D485 Neoplasm of uncertain behavior of skin: Secondary | ICD-10-CM | POA: Diagnosis not present

## 2021-02-07 DIAGNOSIS — D1724 Benign lipomatous neoplasm of skin and subcutaneous tissue of left leg: Secondary | ICD-10-CM | POA: Diagnosis not present

## 2021-02-07 DIAGNOSIS — C44629 Squamous cell carcinoma of skin of left upper limb, including shoulder: Secondary | ICD-10-CM | POA: Diagnosis not present

## 2021-04-27 ENCOUNTER — Telehealth: Payer: Self-pay | Admitting: Physician Assistant

## 2021-04-27 DIAGNOSIS — I10 Essential (primary) hypertension: Secondary | ICD-10-CM

## 2021-04-27 DIAGNOSIS — E785 Hyperlipidemia, unspecified: Secondary | ICD-10-CM

## 2021-04-27 DIAGNOSIS — I480 Paroxysmal atrial fibrillation: Secondary | ICD-10-CM

## 2021-04-27 DIAGNOSIS — Z79899 Other long term (current) drug therapy: Secondary | ICD-10-CM

## 2021-04-27 NOTE — Telephone Encounter (Signed)
Patient scheduled for ROV in September and wants to obtain labs prior to visit.

## 2021-04-27 NOTE — Telephone Encounter (Signed)
Left voicemail message to call back  

## 2021-04-27 NOTE — Telephone Encounter (Signed)
Patient returning call. Please advise

## 2021-04-28 NOTE — Telephone Encounter (Signed)
Patient returning call.

## 2021-04-28 NOTE — Telephone Encounter (Signed)
Spoke with patients wife per release form and patient was also in the background. Advised that I did not see any request for labs prior to next appointment in September. Patient states that he gets these done prior to each appointment to allow the results to come in and as a yearly monitoring of labs. Inquired if he knew which labs that are done and they only know one which is alkaline phosphatase. Reviewed that I will forward this to provider for review and recommendations and will be in touch. Advised that it may be Monday before we call back and they verbalized understanding. No further questions or concerns at this time.

## 2021-05-01 DIAGNOSIS — H43813 Vitreous degeneration, bilateral: Secondary | ICD-10-CM | POA: Diagnosis not present

## 2021-05-01 DIAGNOSIS — Z961 Presence of intraocular lens: Secondary | ICD-10-CM | POA: Diagnosis not present

## 2021-05-01 DIAGNOSIS — H31091 Other chorioretinal scars, right eye: Secondary | ICD-10-CM | POA: Diagnosis not present

## 2021-05-01 DIAGNOSIS — H02831 Dermatochalasis of right upper eyelid: Secondary | ICD-10-CM | POA: Diagnosis not present

## 2021-05-01 DIAGNOSIS — H35371 Puckering of macula, right eye: Secondary | ICD-10-CM | POA: Diagnosis not present

## 2021-05-01 DIAGNOSIS — H02834 Dermatochalasis of left upper eyelid: Secondary | ICD-10-CM | POA: Diagnosis not present

## 2021-05-07 ENCOUNTER — Other Ambulatory Visit: Payer: Self-pay | Admitting: Physician Assistant

## 2021-05-07 DIAGNOSIS — I4891 Unspecified atrial fibrillation: Secondary | ICD-10-CM

## 2021-05-07 DIAGNOSIS — Z79899 Other long term (current) drug therapy: Secondary | ICD-10-CM

## 2021-05-09 NOTE — Telephone Encounter (Signed)
Attempted to call both pt and wife, [name] [name]. No answer.  Left detailed message (DPR approved) on wife's vm that pt may have labs prior to appointment in September per Jacquelyn. Notified that she has ordered CMET, CBC, Magnesium, TSH, Lipid panel and that if any further tests were requested these would need to be ordered by PCP. Notified that these labs will need to be FASTING with lipid panel. Notified on vm instructions to arrive at the [facility] at pt convenience any time between 7AM and 6PM M-F and no appt needed. Asked that she or pt call back with any further questions.   I have changed lab orders to be completed at [facility] mall at [facility].

## 2021-05-11 NOTE — Telephone Encounter (Signed)
Spoke with patients wife per release form and reviewed information from provider. Discussed labs that were ordered and he can have them done over at the [facility]. Offered to send my chart message but she stated that she had all the information and did not need it. She verbalized understanding of our conversation, agreement with plan, and had no further questions at this time.     Marrianne Mood D, PA-C  Valora Corporal, RN; P Cv Div [place] Triage Caller: Unspecified (2 weeks ago) I suspect that he maybe wants [place] to do his annual PCP labs and send them along to his PCP.   We can certainly collect labs that are helpful from a medical management standpoint, which include a CBC, CMET, Mg, TSH, and lipids.   If any additional labs are needed, his PCP should place the order, as we will want to be certain they are covered from an insurance standpoint. We can likely still collect them here, but insurance is more likely to cover them if ordered by primary care and not relevant to his cardiac care. It looks like we may have had to fax previous results to his PCP in the past, and due to a difference in the electronic medical records, so I suspect any ordered labs from his PCP office will require printed out orders. Last year I was able to collect a CRP for his PCP- was this covered by insurance?   I will place orders for the above labs now if he wants to get them before hisvisit.  Let me know if additional questions or concerns - thanks!

## 2021-07-24 ENCOUNTER — Other Ambulatory Visit
Admission: RE | Admit: 2021-07-24 | Discharge: 2021-07-24 | Disposition: A | Discharge status: Home / Self Care | Payer: Medicare Other | Source: Ambulatory Visit | Attending: Physician Assistant | Admitting: Physician Assistant

## 2021-07-24 DIAGNOSIS — Z79899 Other long term (current) drug therapy: Secondary | ICD-10-CM | POA: Insufficient documentation

## 2021-07-24 DIAGNOSIS — I480 Paroxysmal atrial fibrillation: Secondary | ICD-10-CM | POA: Diagnosis not present

## 2021-07-24 DIAGNOSIS — I10 Essential (primary) hypertension: Secondary | ICD-10-CM | POA: Diagnosis not present

## 2021-07-24 DIAGNOSIS — E785 Hyperlipidemia, unspecified: Secondary | ICD-10-CM | POA: Diagnosis not present

## 2021-07-24 LAB — LIPID PANEL
Cholesterol: 200 mg/dL (ref 0–200)
HDL: 63 mg/dL (ref >40)
LDL Cholesterol: 124 mg/dL — ABNORMAL HIGH (ref 0–99)
Total CHOL/HDL Ratio: 3.2 RATIO
Triglycerides: 65 mg/dL (ref <150)
VLDL: 13 mg/dL (ref 0–40)

## 2021-07-24 LAB — COMPREHENSIVE METABOLIC PANEL
ALT: 16 U/L (ref 0–44)
AST: 23 U/L (ref 15–41)
Albumin: 4.3 g/dL (ref 3.5–5.0)
Alkaline Phosphatase: 321 U/L — ABNORMAL HIGH (ref 38–126)
Anion gap: 6 (ref 5–15)
BUN: 19 mg/dL (ref 8–23)
CO2: 27 mmol/L (ref 22–32)
Calcium: 9.2 mg/dL (ref 8.9–10.3)
Chloride: 108 mmol/L (ref 98–111)
Creatinine, Ser: 1.02 mg/dL (ref 0.61–1.24)
GFR, Estimated: >60 mL/min (ref >60)
Glucose, Bld: 98 mg/dL (ref 70–99)
Potassium: 4.2 mmol/L (ref 3.5–5.1)
Sodium: 141 mmol/L (ref 135–145)
Total Bilirubin: 1 mg/dL (ref 0.3–1.2)
Total Protein: 7.1 g/dL (ref 6.5–8.1)

## 2021-07-24 LAB — CBC WITH DIFFERENTIAL/PLATELET
Abs Immature Granulocytes: 0.02 10*3/uL (ref 0.00–0.07)
Basophils Absolute: 0 10*3/uL (ref 0.0–0.1)
Basophils Relative: 1 %
Eosinophils Absolute: 0.1 10*3/uL (ref 0.0–0.5)
Eosinophils Relative: 1 %
HCT: 40.2 % (ref 39.0–52.0)
Hemoglobin: 14.3 g/dL (ref 13.0–17.0)
Immature Granulocytes: 0 %
Lymphocytes Relative: 28 %
Lymphs Abs: 1.3 10*3/uL (ref 0.7–4.0)
MCH: 34.8 pg — ABNORMAL HIGH (ref 26.0–34.0)
MCHC: 35.6 g/dL (ref 30.0–36.0)
MCV: 97.8 fL (ref 80.0–100.0)
Monocytes Absolute: 0.5 10*3/uL (ref 0.1–1.0)
Monocytes Relative: 11 %
Neutro Abs: 2.7 10*3/uL (ref 1.7–7.7)
Neutrophils Relative %: 59 %
Platelets: 166 10*3/uL (ref 150–400)
RBC: 4.11 MIL/uL — ABNORMAL LOW (ref 4.22–5.81)
RDW: 13.3 % (ref 11.5–15.5)
WBC: 4.6 10*3/uL (ref 4.0–10.5)
nRBC: 0 % (ref 0.0–0.2)

## 2021-07-24 LAB — MAGNESIUM: Magnesium: 2.2 mg/dL (ref 1.7–2.4)

## 2021-07-24 LAB — TSH: TSH: 5.17 u[IU]/mL — ABNORMAL HIGH (ref 0.350–4.500)

## 2021-07-26 ENCOUNTER — Telehealth: Payer: Self-pay

## 2021-07-26 NOTE — Telephone Encounter (Signed)
Able to reach pt's wife regarding Mr. Vonbargen's recent lab work, Blanche East, PA-C had a chance to review their results and advised   "Labs obtained for PCP/annual check=  --Bad cholesterol (LDL) elevated at 124 - better control recommended as below.  --Thyroid lab elevated - will Cc PCP.  --Kidney function / electrolytes stable  --Liver enzymes stable  --Alkaline phosphatase elevated as in past.  Will Cc PCP.  --Blood count stable.   Recommendations:  --Reassuring labs from heart standpoint with the exception of LDL, which should be below 70. Given the calcification seen on his prior CT and history of mildly dilated aorta, recommend better cholesterol control with a statin or Zetia (he declined these when last seen by Dr. Rockey Situ). If agreeable to start a statin, please start Crestor '40mg'$  daily. If not interested in a statin, he can try Zetia '10mg'$  daily. "  At this time, wife and pt are hesitate of starting a cholesterol medication as past family has complained of statin intolerance and leg cramping/muscle weakness. Wants to control with diet and exercise and will think about possible Zetia. Has appt with Richrd Sox 9/23 and will discuss medication further at that time.

## 2021-08-04 ENCOUNTER — Ambulatory Visit (INDEPENDENT_AMBULATORY_CARE_PROVIDER_SITE_OTHER): Payer: Medicare Other | Admitting: Physician Assistant

## 2021-08-04 ENCOUNTER — Other Ambulatory Visit: Payer: Self-pay

## 2021-08-04 ENCOUNTER — Encounter: Payer: Self-pay | Admitting: Physician Assistant

## 2021-08-04 VITALS — BP 130/58 | HR 65 | Ht 71.0 in | Wt 196.1 lb

## 2021-08-04 DIAGNOSIS — I471 Supraventricular tachycardia, unspecified: Secondary | ICD-10-CM

## 2021-08-04 DIAGNOSIS — I480 Paroxysmal atrial fibrillation: Secondary | ICD-10-CM

## 2021-08-04 DIAGNOSIS — E785 Hyperlipidemia, unspecified: Secondary | ICD-10-CM

## 2021-08-04 DIAGNOSIS — I10 Essential (primary) hypertension: Secondary | ICD-10-CM

## 2021-08-04 MED ORDER — APIXABAN 5 MG PO TABS: 5.0000 mg | ORAL_TABLET | Freq: Two times a day (BID) | ORAL | 2 refills | Status: DC

## 2021-08-04 NOTE — Patient Instructions (Signed)
Medication Instructions:  .isntcur  *If you need a refill on your cardiac medications before your next appointment, please call your pharmacy*   Lab Work: None ordered   If you have labs (blood work) drawn today and your tests are completely normal, you will receive your results only by: [facility] (if you have MyChart) OR A paper copy in the mail If you have any lab test that is abnormal or we need to change your treatment, we will call you to review the results.   Testing/Procedures: None ordered    Follow-Up: Follow up as scheduled    Other Instructions Look into Zetia and see if this is a medication that you will be comfortable taking for your cholesterol.

## 2021-08-04 NOTE — Progress Notes (Signed)
Office Visit    Patient Name: [name] [name] Date of Encounter: 08/04/2021  PCP:  Hayden Rasmussen, MD   [place]  Cardiologist:  Dr. Rockey Situ Advanced Practice Provider:  No care team member to display Electrophysiologist:  None   Chief Complaint    Chief Complaint  Patient presents with   Other    12 month f/u no complaints today. Meds reviewed verbally with pt.    80 y.o. male PMH PAF s/p DCCV 10/2009 (CHA2DS2VASc 3), medication noncompliance / [place] noncompliance, cardiac catheterization 04/2006 without significant CAD / EF 45-55% , HTN, HLD, past h/o smoking, erosive esophagitis, prostate CA (1998) / basal cell carcinoma, partial colectomy 2018, and Paget's disease and here for 1 yr follow-up.   Past Medical History    Past Medical History:  Diagnosis Date   Arthritis    Basal cell carcinoma of cheek    Cerumen impaction 06/05/2016   CHF (congestive heart failure) ([place])    history of this   Colon cancer ([place]) 10/27/2016   Found on flex sig 9242   Complication of anesthesia    at 09/2016-had high BP with colonoscopy.   Erosive esophagitis 2014   by EGD [name])   Paget's disease    Dr. [name] [facility])   Paroxysmal atrial fibrillation [facility])    Prostate cancer [facility]) 1998   s/p seed implants [name]) in remission   Past Surgical History:  Procedure Laterality Date   BASAL CELL CARCINOMA EXCISION     CARDIAC CATHETERIZATION  2007   No significant CAD   CARDIOVERSION  2010   A-Fib   CATARACT EXTRACTION     COLONOSCOPY  01/1999   radiation proctitis, rec rpt 5 yrs [name])   COLONOSCOPY  09/2016   invasive adenocacinoma araising in TVA - referred to surgery, radiation proctitis, rpt 1 yr [name])   ESOPHAGOGASTRODUODENOSCOPY N/A 02/24/2013   esoph stricture dilated, marked erosive esophagitis; Surgeon: [name], MD   FLEXIBLE SIGMOIDOSCOPY  10/2016   malignant tumor recto-sigmoid colon, diverticulosis -  referred to surgery [name])   HAMMER TOE SURGERY  04/21/2012   amputation for paget's   [place]  2007   LAPAROSCOPIC SIGMOID COLECTOMY N/A 11/22/2016   path: no residual carcinoma, 13 benign LN, diverticulosis [name], MD)   [place]   Prostate cancer [name])   SAVORY DILATION N/A 02/24/2013   Procedure: Azzie Almas DILATION;  Surgeon: [name], MD;  Location: [facility] ENDOSCOPY;  Service: Endoscopy;  Laterality: N/A;   SHOULDER SURGERY Right 2004   RTC    Allergies  No Known Allergies  History of Present Illness    [name] is a 80 y.o. male with PMH as above and including PAF s/p 2010 DCCV. Patient self reported an episode of Afib 09/2018 lasting more than 15 hours with EKG showing SB at 55bpm.. He takes his amiodarone and  Metoprolol on an "as needed," which he bases on his mobile EMAY monitor. Reports fatigue with BB. He reported that he will not take his amiodarone unless his mobile monitor says that he needs to, such as when it says he is in Afib. He also stated he will not take his BB if his HR is in the 60s, "due to bradycardia." He has not taken his [place] for some time now due to self reported "dark hand spots," as well as scant blood visualized on his hearing aids upon removal.  A 2 week Zio monitor was mailed and performed 8/21 to 9/4 (before start of Cardizem CD) with preliminary results as below but official results still pending. Preliminary results showed mainly SR with min HR 41 and max HR 197. Average HR was 62bpm. He had 2 runs of ventricular tachycardia, the fastest/longest interval lasting 6 beats with a max rate of 141 bpm. He had 82 SVT versus AT with variable block episodes with the fastest interval 7 beats and max rate of 197 bpm. The longest lasted 18 beats with an average rate of 144 bpm. Ventricular bigeminy and trigeminy were also present.    Echo was obtained 9/4 (see below) with EF normal as below. He was  therefore started on low dose Cardizem CD 120mg , given his concern regarding his lower rates.    Labs were obtained and within limits (TSH was elevated on labs; however, it was repeated with PCP with repeat TSH wnl).    Seen today 9/23 and reports he is doing well without CP or SOB, as well as without any sx of overload. On further discussion, he is only taking Eliquis once per day. He states that he had blood in urine and bloody nose, as well as ecchymosis with daily dosing of Eliquis. He also reported bleeding easily.  He states Eliquis is expensive and asks about other options and states he may be interested in Xarelto in the future. He indicates a desire to purchase of medications in [place], which is discouraged as well as other out of country medications. He reports an episode of Afib after his covid shot with rates into the 160s.  He states he was instructed to take amiodarone 400mg  every 4 hours when he that happens with recommendation he discontinue amiodarone today given his TSH results and as this dose of 400mg  every four hours is too high. We also discussed that this medication was discontinued on review of EMR. He has palpitations, especially after exercising, though he recently signed up for a 5K walk that takes place in 3 weeks. As a result, he has been training on a treadmill 2-3 miles every other day. He denies any exertional sx and reports only the palpitations after exercise. He states he has not smoked for many years (quit out of college). Labs reviewed in detail, including LDL, and with pt preference to avoid medication changes and try lifestyle changes and recheck LDL at RTC.  Home Medications   Current Outpatient Medications  Medication Instructions   apixaban (ELIQUIS) 5 mg, Oral, 2 times daily   cholecalciferol (VITAMIN D) 5,000 Units, Oral, Every morning   diltiazem (CARDIZEM CD) 120 mg, Oral, Daily   fish oil-omega-3 fatty acids 1 g, Oral, Every morning    glucosamine-chondroitin 500-400 MG tablet 1 tablet, Oral, Daily   lisinopril (ZESTRIL) 20 mg, Oral, Daily   Magnesium 400 mg, Oral, Every morning   Multiple Vitamin (MULTIVITAMIN WITH MINERALS) TABS 1 tablet, Oral, Every morning   NON FORMULARY 2 scoop, 2 times daily PRN, Reported on 05/28/2016   NON FORMULARY Co-Q-10 30 mg/L-Carnitine-250 mg/Ashwagandha-475mg     RESVERATROL PO 20 mg, Oral, Daily   TART CHERRY PO Oral, 500 mg    Turmeric (QC TUMERIC COMPLEX PO) Oral   vitamin C (ASCORBIC ACID) 1,000 mg, Oral, 2 times daily     Review of Systems    He denies chest pain, dyspnea, pnd, orthopnea, n, v, dizziness, syncope, edema, weight gain, or early satiety. He reports palpitations after exercise and elevated HR after  the COVID19 shot.   All other systems reviewed and are otherwise negative except as noted above.  Physical Exam    VS:  BP (!) 130/58 (BP Location: Right Arm, Patient Position: Sitting, Cuff Size: Normal)   Pulse 65   Ht 5\' 11"  (1.803 m)   Wt 196 lb 2 oz (89 kg)   SpO2 97%   BMI 27.35 kg/m  , BMI Body mass index is 27.35 kg/m. GEN: Well nourished, well developed, in no acute distress. Mask in place. HEENT: normal. Neck: Supple, no JVD, carotid bruits, or masses. Cardiac: RRR, no murmurs, rubs, or gallops. No clubbing, cyanosis, edema.  Radials/DP/PT 2+ and equal bilaterally.  Respiratory:  Respirations regular and unlabored, clear to auscultation bilaterally. GI: Soft, nontender, nondistended, BS + x 4. MS: no deformity or atrophy. Skin: warm and dry, no rash. Neuro:  Strength and sensation are intact. Psych: Normal affect.  Accessory Clinical Findings    ECG personally reviewed by me today - NSR, 65bpm, occasional PVCs, PR interval 180 ms, IVCD with QRS 106 ms, QTC 449 ms- no acute changes.  VITALS Reviewed today   Temp Readings from Last 3 Encounters:  11/27/16 98.3 F (36.8 C) (Oral)  11/19/16 98.5 F (36.9 C) (Oral)  06/05/16 98 F (36.7 C) (Oral)    BP Readings from Last 3 Encounters:  08/04/21 (!) 130/58  10/03/20 (!) 144/62  10/05/19 130/60   Pulse Readings from Last 3 Encounters:  08/04/21 65  10/03/20 (!) 58  10/05/19 62    Wt Readings from Last 3 Encounters:  08/04/21 196 lb 2 oz (89 kg)  10/03/20 192 lb (87.1 kg)  10/05/19 194 lb 4 oz (88.1 kg)     LABS  reviewed today   Lab Results  Component Value Date   WBC 4.6 07/24/2021   HGB 14.3 07/24/2021   HCT 40.2 07/24/2021   MCV 97.8 07/24/2021   PLT 166 07/24/2021   Lab Results  Component Value Date   CREATININE 1.02 07/24/2021   BUN 19 07/24/2021   NA 141 07/24/2021   K 4.2 07/24/2021   CL 108 07/24/2021   CO2 27 07/24/2021   Lab Results  Component Value Date   ALT 16 07/24/2021   AST 23 07/24/2021   ALKPHOS 321 (H) 07/24/2021   BILITOT 1.0 07/24/2021   Lab Results  Component Value Date   CHOL 200 07/24/2021   HDL 63 07/24/2021   LDLCALC 124 (H) 07/24/2021   LDLDIRECT 132.9 11/16/2013   TRIG 65 07/24/2021   CHOLHDL 3.2 07/24/2021    Lab Results  Component Value Date   HGBA1C 5.5 11/19/2016   Lab Results  Component Value Date   TSH 5.170 (H) 07/24/2021     STUDIES/PROCEDURES reviewed today   MPI 08/2019 Normal pharmacologic myocardial perfusion stress test without significant ischemia or scar. The left ventricular ejection fraction is normal by visual estimation and Siemens calculation (62%). This is a low risk study. Attenuation correction CT shows mild coronary artery calcification. Aortic atherosclerosis and cholelithiasis are noted.  Zio 07/2019 Preliminary Findings --Patient had a min HR of 41 bpm, max HR of 197 bpm, and avg HR of 62 bpm. --Predominant underlying rhythm was Sinus Rhythm.  --2 Ventricular Tachycardia runs occurred, the run with the fastest interval lasting 6 beats with a max rate of 141 bpm (avg 114 bpm); the run with the fastest interval was also the longest.  --82 Supraventricular Tachycardia runs occurred,  the run with the fastest interval lasting  7 beats with a max rate of 197 bpm, the longest lasting 18 beats with an avg rate of 144 bpm.  --Some episodes of Supraventricular Tachycardia may be possible Atrial Tachycardia with variable block.  --Isolated SVEs were rare (<1.0%), SVE Couplets were rare (<1.0%), and SVE Triplets were rare (<1.0%). Isolated VEs were frequent (12.5%, [id number]), VE Couplets were rare (<1.0%, 927), and VE Triplets were rare (<1.0%, 13).  Ventricular Bigeminy and Trigeminy were present.   --- Echo 07/17/2019  1. The left ventricle has normal systolic function with an ejection fraction of 60-65%. The cavity size was normal. Left ventricular diastolic Doppler parameters are consistent with impaired relaxation.  2. The right ventricle has normal systolic function. The cavity was normal. There is no increase in right ventricular wall thickness.Unable to estimate RVSP  3. Left atrial size was mildly dilated.  4. There is dilatation of the aortic root and of the ascending aorta 4.0 cm.   Assessment & Plan    PAF s/p 2010 DCCV Ectopy, NSVT, SVT --Maintaining SR. Continue Eliquis/OAC with recommendation he increase to frequency of BID. Continue BB and diltiazem. Amiodarone discontinued on review of EMR, though pt reports still taking in high amounts. Concern expressed for ongoing amiodarone with elevated TSH as above.    Hypertension --Continue current medications.     HLD --Not on a statin per patient preference. Discussed Zetia. Pt preference to defer.     Disposition: RTC 6 mo    *Please be aware that the above documentation was completed voice recognition software and may contain dictation errors.     Arvil Chaco, PA-C 08/04/2021

## 2021-10-10 ENCOUNTER — Other Ambulatory Visit: Payer: Self-pay

## 2021-10-10 MED ORDER — DILTIAZEM HCL ER COATED BEADS 120 MG PO CP24: 120.0000 mg | ORAL_CAPSULE | Freq: Every day | ORAL | 0 refills | Status: DC

## 2021-10-10 MED ORDER — LISINOPRIL 20 MG PO TABS: 20.0000 mg | ORAL_TABLET | Freq: Every day | ORAL | 0 refills | Status: DC

## 2021-10-10 NOTE — Telephone Encounter (Signed)
*  STAT* If patient is at the pharmacy, call can be transferred to refill team.   1. Which medications need to be refilled? (please list name of each medication and dose if known) Lisinopril and Cardizem  2. Which pharmacy/location (including street and city if local pharmacy) is medication to be sent to? [place]  3. Do they need a 30 day or 90 day supply? [place]

## 2021-11-20 ENCOUNTER — Ambulatory Visit: Payer: Medicare Other | Admitting: Cardiovascular Disease

## 2022-01-08 ENCOUNTER — Telehealth: Payer: Self-pay | Admitting: Cardiovascular Disease

## 2022-01-08 MED ORDER — LISINOPRIL 20 MG PO TABS: 20.0000 mg | ORAL_TABLET | Freq: Every day | ORAL | 0 refills | Status: DC

## 2022-01-08 MED ORDER — DILTIAZEM HCL ER COATED BEADS 120 MG PO CP24: 120.0000 mg | ORAL_CAPSULE | Freq: Every day | ORAL | 0 refills | Status: DC

## 2022-01-08 NOTE — Telephone Encounter (Signed)
Requested Prescriptions   Signed Prescriptions Disp Refills   diltiazem (CARDIZEM CD) 120 MG 24 hr capsule 90 capsule 0    Sig: Take 1 capsule (120 mg total) by mouth daily.    Authorizing Provider: Minna Merritts    Ordering User: Othelia Pulling C   lisinopril (ZESTRIL) 20 MG tablet 90 tablet 0    Sig: Take 1 tablet (20 mg total) by mouth daily.    Authorizing Provider: Minna Merritts    Ordering User: Britt Bottom

## 2022-01-08 NOTE — Telephone Encounter (Signed)
°*  STAT* If patient is at the pharmacy, call can be transferred to refill team.   1. Which medications need to be refilled? (please list name of each medication and dose if known) diltiazem and lisinopril   2. Which pharmacy/location (including street and city if local pharmacy) is medication to be sent to?[facility] [place]   3. Do they need a 30 day or 90 day supply? [place]

## 2022-02-05 NOTE — Progress Notes (Signed)
Cardiology Office Note ? ?Date:  02/06/2022  ? ?ID:  [name] [name], DOB 01/30/1941, MRN [id number] ? ?PCP:  [name] Rasmussen, MD  ? ?Chief Complaint  ?Patient presents with  ? 6 month follow up   ?  Patient c/o more frequent spells of arrhythmia.  Medications reviewed by the patient verbally.   ? ? ?HPI:  ?[name] [name] is a 81 year old gentleman, history of  ?padgets, toe amputation on the left   ?hyperlipidemia ,  ?cardiac catheterization in June 2007 that showed no significant coronary artery disease. ?paroxysmal atrial fibrillation with cardioversion in December of 2010,  ?Continued episodes of paroxysmal atrial fibrillation, previously declined anticoagulation and preferred to take pradaxa as needed  ?stress at home, daughter had cancer, died in 2015/10/07 ?Partial colectomy 11/2016 ?Noncompliance on eliquis, Chads Vasc of  3  ?Dilated aorta 4 cm  ?who presents for routine followup of his atrial fibrillation ? ?Last seen in clinic by myself 10/06/2020 ?Seen by one of our providers September 2022 ? ?[place], ?Feels extra beats when sitting, ?Does exercise, [employer] ? ?Previously taking Eliquis once a day ?Confirmed taking twice a day ? ?Rare tachycardia, "6 months ago" ?Takes amiodarone and metoprolol "as needed" for breakthrough afib ? ?EKG personally reviewed by myself on todays visit ?NSR rate 62 bpm,  no ST or T wave changes ? ?Other past medical history reviewed ?Zio  ? sinus Rhythm.  ?--82 Supraventricular Tachycardia runs occurred,the longest lasting 18 beats with an avg rate of 144 bpm.  ?Isolated VEs were frequent (12.5%, [id number]), VE Couplets were rare (<1.0%, 927), and VE Triplets were rare ?(<1.0%, 13).  Ventricular Bigeminy and Trigeminy were present. ?  ?Echo 07/17/2019,  ? 1. The left ventricle has normal systolic function with an ejection fraction of 60-65%.  ? dilatation of the aortic root and of the ascending aorta 4.0 cm. ? ?Stress test performed with no significant  ischemia ?Mild aortic atherosclerosis seen on CT, images pulled up and discussed with him ? ?5 episodes of atrial fib, most recently in 10/06/2018 ?Often lasting >15 hours ?Previously took amiodarone as needed,  ? ?episode of atrial fibrillation 06/11/2014. This lasted less than one day. He does take amiodarone as needed to restore normal sinus rhythm.  ?Since the event at the end of July, he is taking metoprolol 12.5 mg twice a day ?He also takes extra metoprolol for breakthrough arrhythmia ?  ?Previous episodes of atrial fibrillation in 01/21/2013 lasting 15 hours and 06/26/2015 lasting less than 24 hours. ? In August 2014, he had had his esophagus stretched and he feels that this attributed to his recurrent arrhythmia. ?  ? cardiac catheterization in June 2007 that showed no significant coronary artery disease. Ejection fraction at that time was 45-55% and he was in atrial fibrillation with a rapid rate. ?  ? ?PMH:   has a past medical history of Arthritis, Basal cell carcinoma of cheek, Cerumen impaction (06/05/2016), CHF (congestive heart failure) ([name] [name]), Colon cancer ([name] [name]) (74/10/8785), Complication of anesthesia, Erosive esophagitis (2014), Paget's disease, Paroxysmal atrial fibrillation ([name] [name]), and Prostate cancer ([name] [name]) (1998). ? ?PSH:    ?Past Surgical History:  ?Procedure Laterality Date  ? BASAL CELL CARCINOMA EXCISION    ? CARDIAC CATHETERIZATION  2007  ? No significant CAD  ? CARDIOVERSION  2010  ? A-Fib  ? CATARACT EXTRACTION    ? COLONOSCOPY  01/1999  ? radiation proctitis, rec rpt 5 yrs [name])  ? COLONOSCOPY  06-Oct-2016  ?  invasive adenocacinoma araising in TVA - referred to surgery, radiation proctitis, rpt 1 yr [name])  ? ESOPHAGOGASTRODUODENOSCOPY N/A 02/24/2013  ? esoph stricture dilated, marked erosive esophagitis; Surgeon: [name], MD  ? FLEXIBLE SIGMOIDOSCOPY  10/2016  ? malignant tumor recto-sigmoid colon, diverticulosis - referred to surgery [name])  ? HAMMER TOE SURGERY   04/21/2012  ? amputation for paget's  ? INGUINAL HERNIA REPAIR  2007  ? LAPAROSCOPIC SIGMOID COLECTOMY N/A 11/22/2016  ? path: no residual carcinoma, 13 benign LN, diverticulosis [name], MD)  ? RADIOACTIVE SEED IMPLANT  1998  ? Prostate cancer [name])  ? SAVORY DILATION N/A 02/24/2013  ? Procedure: SAVORY DILATION;  Surgeon: [name], MD;  Location: [facility];  Service: Endoscopy;  Laterality: N/A;  ? SHOULDER SURGERY Right 2004  ? RTC  ? ? ?Current Outpatient Medications  ?Medication Sig Dispense Refill  ? cholecalciferol (VITAMIN D) 1000 UNITS tablet Take 5,000 Units by mouth every morning.     ? diltiazem (CARDIZEM CD) 120 MG 24 hr capsule Take 1 capsule (120 mg total) by mouth daily. 90 capsule 0  ? fish oil-omega-3 fatty acids 1000 MG capsule Take 1 g by mouth every morning.     ? glucosamine-chondroitin 500-400 MG tablet Take 1 tablet by mouth daily.    ? lisinopril (ZESTRIL) 20 MG tablet Take 1 tablet (20 mg total) by mouth daily. 90 tablet 0  ? Magnesium 400 MG CAPS Take 400 mg by mouth every morning.     ? Multiple Vitamin (MULTIVITAMIN WITH MINERALS) TABS Take 1 tablet by mouth every morning.    ? NON FORMULARY 2 scoop 2 (two) times daily as needed (calm plus - when in A-fib). Reported on 05/28/2016    ? NON FORMULARY Co-Q-10 30 mg/L-Carnitine-250 mg/Ashwagandha-'475mg'$     ? RESVERATROL PO Take 20 mg by mouth daily.    ? TART CHERRY PO Take by mouth. 500 mg    ? Turmeric (QC TUMERIC COMPLEX PO) Take by mouth.    ? vitamin C (ASCORBIC ACID) 500 MG tablet Take 1,000 mg by mouth 2 (two) times daily.     ? apixaban (ELIQUIS) 5 MG TABS tablet Take 1 tablet (5 mg total) by mouth 2 (two) times daily. 180 tablet 3  ? ?No current facility-administered medications for this visit.  ? ? ?Allergies:   Patient has no known allergies.  ? ?Social History:  The patient  reports that he quit smoking about 12 years ago. His smoking use included cigars. He has never used smokeless tobacco. He reports  current alcohol use of about 1.0 standard drink per week. He reports that he does not use drugs.  ? ?Family History:   family history includes Arrhythmia in his mother; Arthritis in his mother; Cancer in his brother; Diabetes in his mother; Stroke in his father.  ? ? ?Review of Systems: ?Review of Systems  ?Constitutional: Negative.   ?HENT: Negative.    ?Respiratory: Negative.    ?Cardiovascular:  Positive for palpitations.  ?Gastrointestinal: Negative.   ?Musculoskeletal: Negative.   ?Neurological: Negative.   ?Psychiatric/Behavioral: Negative.    ?All other systems reviewed and are negative. ? ?PHYSICAL EXAM: ?VS:  BP (!) 160/60 (BP Location: Left Arm, Patient Position: Sitting, Cuff Size: Normal)   Pulse 62   Ht '5\' 11"'$  (1.803 m)   Wt 201 lb 2 oz (91.2 kg)   SpO2 98%   BMI 28.05 kg/m?  , BMI Body mass index is 28.05 kg/m?Marland Kitchen ?Constitutional:  oriented to person, place, and time. No distress.  ?HENT:  ?Head: Grossly normal ?Eyes:  no discharge. No scleral icterus.  ?Neck: No JVD, no carotid bruits  ?Cardiovascular: Regular rate and rhythm, no murmurs appreciated ?Pulmonary/Chest: Clear to auscultation bilaterally, no wheezes or rails ?Abdominal: Soft.  no distension.  no tenderness.  ?Musculoskeletal: Normal range of motion ?Neurological:  normal muscle tone. Coordination normal. No atrophy ?Skin: Skin warm and dry ?Psychiatric: normal affect, pleasant ? ?Recent Labs: ?07/24/2021: ALT 16; BUN 19; Creatinine, Ser 1.02; Hemoglobin 14.3; Magnesium 2.2; Platelets 166; Potassium 4.2; Sodium 141; TSH 5.170  ? ? ?Lipid Panel ?Lab Results  ?Component Value Date  ? CHOL 200 07/24/2021  ? HDL 63 07/24/2021  ? LDLCALC 124 (H) 07/24/2021  ? TRIG 65 07/24/2021  ? ? ?Wt Readings from Last 3 Encounters:  ?02/06/22 201 lb 2 oz (91.2 kg)  ?08/04/21 196 lb 2 oz (89 kg)  ?10/03/20 192 lb (87.1 kg)  ?  ? ?ASSESSMENT AND PLAN: ? ?Paroxysmal atrial fibrillation (HCC) - ?eliquis  5 twice a day, previously reports taking once a day  (for "nosebleed") ?We recommend he take Eliquis twice a day ?Stay on diltiazem ER 120  ?Reports he has amio and metoprolol to take as needed ?Minimal " tachycardia" episodes ? ?Mixed hyperlipidemia - ?He does not want a statin or Zeti

## 2022-02-06 ENCOUNTER — Other Ambulatory Visit: Payer: Self-pay

## 2022-02-06 ENCOUNTER — Ambulatory Visit (INDEPENDENT_AMBULATORY_CARE_PROVIDER_SITE_OTHER): Payer: Medicare Other | Admitting: Cardiovascular Disease

## 2022-02-06 ENCOUNTER — Encounter: Payer: Self-pay | Admitting: Cardiovascular Disease

## 2022-02-06 VITALS — BP 160/60 | HR 62 | Ht 71.0 in | Wt 201.1 lb

## 2022-02-06 DIAGNOSIS — Z87891 Personal history of nicotine dependence: Secondary | ICD-10-CM | POA: Diagnosis not present

## 2022-02-06 DIAGNOSIS — I480 Paroxysmal atrial fibrillation: Secondary | ICD-10-CM | POA: Diagnosis not present

## 2022-02-06 DIAGNOSIS — I10 Essential (primary) hypertension: Secondary | ICD-10-CM

## 2022-02-06 DIAGNOSIS — I471 Supraventricular tachycardia: Secondary | ICD-10-CM | POA: Diagnosis not present

## 2022-02-06 DIAGNOSIS — E785 Hyperlipidemia, unspecified: Secondary | ICD-10-CM

## 2022-02-06 MED ORDER — APIXABAN 5 MG PO TABS: 5.0000 mg | ORAL_TABLET | Freq: Two times a day (BID) | ORAL | 3 refills | Status: DC

## 2022-02-06 NOTE — Patient Instructions (Addendum)
Medication Instructions:  Your physician recommends that you continue on your current medications as directed. Please refer to the Current Medication list given to you today.  If you need a refill on your cardiac medications before your next appointment, please call your pharmacy.   Lab work: No new labs needed  Testing/Procedures: No new testing needed  Follow-Up: At [facility], you and your health needs are our priority.  As part of our continuing mission to provide you with exceptional heart care, we have created designated Provider Care Teams.  These Care Teams include your primary Cardiologist (physician) and Advanced Practice Providers (APPs -  Physician Assistants and Nurse Practitioners) who all work together to provide you with the care you need, when you need it.  You will need a follow up appointment in 12 months  Providers on your designated Care Team:   Christopher Berge, NP Ryan Dunn, PA-C Cadence Furth, PA-C  COVID-19 Vaccine Information can be found at: [email or web address] For questions related to vaccine distribution or appointments, please email [email or web address] or call [phone].   

## 2022-04-10 ENCOUNTER — Other Ambulatory Visit: Payer: Self-pay | Admitting: Cardiovascular Disease

## 2022-04-10 DIAGNOSIS — I10 Essential (primary) hypertension: Secondary | ICD-10-CM | POA: Diagnosis not present

## 2022-04-10 DIAGNOSIS — E559 Vitamin D deficiency, unspecified: Secondary | ICD-10-CM | POA: Diagnosis not present

## 2022-04-10 DIAGNOSIS — R7989 Other specified abnormal findings of blood chemistry: Secondary | ICD-10-CM | POA: Diagnosis not present

## 2022-04-17 DIAGNOSIS — I4891 Unspecified atrial fibrillation: Secondary | ICD-10-CM | POA: Diagnosis not present

## 2022-04-17 DIAGNOSIS — Z7901 Long term (current) use of anticoagulants: Secondary | ICD-10-CM | POA: Diagnosis not present

## 2022-04-17 DIAGNOSIS — F0150 Vascular dementia without behavioral disturbance: Secondary | ICD-10-CM | POA: Diagnosis not present

## 2022-04-17 DIAGNOSIS — Z0000 Encounter for general adult medical examination without abnormal findings: Secondary | ICD-10-CM | POA: Diagnosis not present

## 2022-04-17 DIAGNOSIS — I472 Ventricular tachycardia, unspecified: Secondary | ICD-10-CM | POA: Diagnosis not present

## 2022-04-17 DIAGNOSIS — M889 Osteitis deformans of unspecified bone: Secondary | ICD-10-CM | POA: Diagnosis not present

## 2022-04-17 DIAGNOSIS — I10 Essential (primary) hypertension: Secondary | ICD-10-CM | POA: Diagnosis not present

## 2022-04-17 DIAGNOSIS — R946 Abnormal results of thyroid function studies: Secondary | ICD-10-CM | POA: Diagnosis not present

## 2022-05-02 DIAGNOSIS — H26493 Other secondary cataract, bilateral: Secondary | ICD-10-CM | POA: Diagnosis not present

## 2022-05-02 DIAGNOSIS — H43813 Vitreous degeneration, bilateral: Secondary | ICD-10-CM | POA: Diagnosis not present

## 2022-05-02 DIAGNOSIS — H31091 Other chorioretinal scars, right eye: Secondary | ICD-10-CM | POA: Diagnosis not present

## 2022-05-02 DIAGNOSIS — H02834 Dermatochalasis of left upper eyelid: Secondary | ICD-10-CM | POA: Diagnosis not present

## 2022-05-02 DIAGNOSIS — H02831 Dermatochalasis of right upper eyelid: Secondary | ICD-10-CM | POA: Diagnosis not present

## 2022-05-02 DIAGNOSIS — Z961 Presence of intraocular lens: Secondary | ICD-10-CM | POA: Diagnosis not present

## 2022-05-02 DIAGNOSIS — H35371 Puckering of macula, right eye: Secondary | ICD-10-CM | POA: Diagnosis not present

## 2022-07-24 DIAGNOSIS — D225 Melanocytic nevi of trunk: Secondary | ICD-10-CM | POA: Diagnosis not present

## 2022-07-24 DIAGNOSIS — Z85828 Personal history of other malignant neoplasm of skin: Secondary | ICD-10-CM | POA: Diagnosis not present

## 2022-07-24 DIAGNOSIS — L814 Other melanin hyperpigmentation: Secondary | ICD-10-CM | POA: Diagnosis not present

## 2022-07-24 DIAGNOSIS — C4442 Squamous cell carcinoma of skin of scalp and neck: Secondary | ICD-10-CM | POA: Diagnosis not present

## 2022-07-24 DIAGNOSIS — Z08 Encounter for follow-up examination after completed treatment for malignant neoplasm: Secondary | ICD-10-CM | POA: Diagnosis not present

## 2022-07-24 DIAGNOSIS — L570 Actinic keratosis: Secondary | ICD-10-CM | POA: Diagnosis not present

## 2022-07-24 DIAGNOSIS — D485 Neoplasm of uncertain behavior of skin: Secondary | ICD-10-CM | POA: Diagnosis not present

## 2022-07-24 DIAGNOSIS — L821 Other seborrheic keratosis: Secondary | ICD-10-CM | POA: Diagnosis not present

## 2022-08-07 ENCOUNTER — Telehealth: Payer: Self-pay | Admitting: Cardiovascular Disease

## 2022-08-07 MED ORDER — LISINOPRIL 20 MG PO TABS: 20.0000 mg | ORAL_TABLET | Freq: Every day | ORAL | 2 refills | Status: DC

## 2022-08-07 MED ORDER — DILTIAZEM HCL ER COATED BEADS 120 MG PO CP24: 120.0000 mg | ORAL_CAPSULE | Freq: Every day | ORAL | 2 refills | Status: DC

## 2022-08-07 NOTE — Telephone Encounter (Signed)
*  STAT* If patient is at the pharmacy, call can be transferred to refill team.   1. Which medications need to be refilled? (please list name of each medication and dose if known)   lisinopril (ZESTRIL) 20 MG tablet diltiazem (CARDIZEM CD) 120 MG 24 hr capsule  2. Which pharmacy/location (including street and city if local pharmacy) is medication to be sent to?  [place] ([place]), [place] [address]  3. Do they need a 30 day or 90 day supply?  90 day  Patient stated he has 1 week of these medications left.

## 2022-08-07 NOTE — Telephone Encounter (Signed)
Requested Prescriptions   Signed Prescriptions Disp Refills   diltiazem (CARDIZEM CD) 120 MG 24 hr capsule 90 capsule 2    Sig: Take 1 capsule (120 mg total) by mouth daily.    Authorizing Provider: Minna Merritts    Ordering User: Othelia Pulling C   lisinopril (ZESTRIL) 20 MG tablet 90 tablet 2    Sig: Take 1 tablet (20 mg total) by mouth daily.    Authorizing Provider: Minna Merritts    Ordering User: Britt Bottom

## 2022-08-22 DIAGNOSIS — L570 Actinic keratosis: Secondary | ICD-10-CM | POA: Diagnosis not present

## 2022-08-29 DIAGNOSIS — D044 Carcinoma in situ of skin of scalp and neck: Secondary | ICD-10-CM | POA: Diagnosis not present

## 2022-09-18 DIAGNOSIS — Z6828 Body mass index (BMI) 28.0-28.9, adult: Secondary | ICD-10-CM | POA: Diagnosis not present

## 2022-09-18 DIAGNOSIS — M889 Osteitis deformans of unspecified bone: Secondary | ICD-10-CM | POA: Diagnosis not present

## 2022-09-18 DIAGNOSIS — E663 Overweight: Secondary | ICD-10-CM | POA: Diagnosis not present

## 2022-09-18 DIAGNOSIS — M1991 Primary osteoarthritis, unspecified site: Secondary | ICD-10-CM | POA: Diagnosis not present

## 2022-11-30 ENCOUNTER — Telehealth: Payer: Self-pay | Admitting: Cardiovascular Disease

## 2022-11-30 MED ORDER — METOPROLOL TARTRATE 25 MG PO TABS: ORAL_TABLET | ORAL | 1 refills | Status: DC

## 2022-11-30 MED ORDER — AMIODARONE HCL 200 MG PO TABS: ORAL_TABLET | ORAL | 1 refills | Status: DC

## 2022-11-30 NOTE — Telephone Encounter (Signed)
Patient called to talk with Dr. Gollan or nurse. Please call back 

## 2022-11-30 NOTE — Telephone Encounter (Signed)
Called pt and spoke with pt's wife Izora Gala, to inform them that pt medication Amiodarone, has been D/C and pt needed to contact our office to speak with the nurse concerning this matter, because medication is not longer on the list, we will not be able to refill it. I advised the wife that if the pt has any other problems, questions or concerns, to give our office a call back. Pt's wife verbalized understanding.

## 2022-11-30 NOTE — Telephone Encounter (Signed)
I called the patient and advised him that his RX's have been sent to his requested pharmacy.  He voices understanding and is agreeable.

## 2022-11-30 NOTE — Telephone Encounter (Signed)
I spoke with the patient. He states for quite some time he has been using:  - Amiodarone 200 mg: 1 tablet every 4-6 hours as needed for a-fib episodes  - Metoprolol tartrate 25 mg - 1 tablet every 6 hours as needed for a-fib episodes, not to exceed 3 tablets per day  He is needing these refilled as he rarely has a-fib epidoses but when he does, this combination of therapy will help convert him to NSR.  The patient reports and he did have an episode of a-fib last night that lasted ~ 13 hours,but he took what he had on hand for his amiodarone & metoprolol and her converted back to rhythm.    I advised the patient that the RX for these drugs are no longer on his medication list, although I do see it mentioned in Dr. Donivan Scull notes he is taking these  drugs with no specific instructions.  The patient is aware I clarify with Dr Rockey Situ to see if we can send in these prescriptions as requested above.  Pharmacy= [facility] on [address]

## 2022-11-30 NOTE — Telephone Encounter (Signed)
Minna Merritts, MD  Sent: Fri November 30, 2022  4:50 PM  To: Emily Filbert, RN         Message  We can refill his medications  Amiodarone and metoprolol  Thx  TG    Additional message received from Dr. Rockey Situ after requesting he clarify if it is ok to refill Amiodarone and metoprolol as below. Per Dr. Raechel Ache I think that is fine. thx could give him 42 of each with 1 refill

## 2022-11-30 NOTE — Telephone Encounter (Signed)
*  STAT* If patient is at the pharmacy, call can be transferred to refill team.   1. Which medications need to be refilled? (please list name of each medication and dose if known) amiodarone '200mg'$    2. Which pharmacy/location (including street and city if local pharmacy) is medication to be sent to?[facility] ([place]), [address]   3. Do they need a 30 day or 90 day supply? 90 day

## 2023-05-10 ENCOUNTER — Other Ambulatory Visit: Payer: Self-pay

## 2023-05-10 MED ORDER — DILTIAZEM HCL ER COATED BEADS 120 MG PO CP24: 120.0000 mg | ORAL_CAPSULE | Freq: Every day | ORAL | 0 refills | Status: DC

## 2023-05-24 ENCOUNTER — Telehealth: Payer: Self-pay | Admitting: Cardiovascular Disease

## 2023-05-24 ENCOUNTER — Other Ambulatory Visit: Payer: Self-pay | Admitting: Cardiovascular Disease

## 2023-05-24 NOTE — Telephone Encounter (Signed)
Please contact pt for future appointment. °Pt due for 12 month f/u. °

## 2023-05-24 NOTE — Telephone Encounter (Signed)
*  STAT* If patient is at the pharmacy, call can be transferred to refill team.   1. Which medications need to be refilled? (please list name of each medication and dose if known) Cardizem cd 120mg   2. Which pharmacy/location (including street and city if local pharmacy) is medication to be sent to?[facility] [address]  3. Do they need a 30 day or 90 day supply? 90

## 2023-05-24 NOTE — Telephone Encounter (Signed)
Called and scheduled patient on 10/22 with Gollan only. Pt chose this date so that his labs and pcp appt would be completed before this visit. Patient also requested that his Cardizem be a 90 day supply due to the cost being the same as 30 day supply. Thank you.

## 2023-05-30 ENCOUNTER — Other Ambulatory Visit: Payer: Self-pay | Admitting: Cardiovascular Disease

## 2023-07-23 ENCOUNTER — Ambulatory Visit: Payer: Medicare Other | Admitting: Cardiovascular Disease

## 2023-08-31 ENCOUNTER — Other Ambulatory Visit: Payer: Self-pay | Admitting: Cardiovascular Disease

## 2023-09-02 NOTE — Progress Notes (Unsigned)
Cardiology Office Note  Date:  09/03/2023   ID:  GARRELL LAVERS, DOB 1941-07-26, MRN [id number]  PCP:  Dois Davenport, MD   Chief Complaint  Patient presents with   12 month follow up     "Doing well." Medications reviewed by the patient's home list.     HPI:  Mr. Ela is a 82 year old gentleman, history of  padgets, toe amputation on the left   hyperlipidemia ,  cardiac catheterization in June 2007 that showed no significant coronary artery disease. paroxysmal atrial fibrillation with cardioversion in December of 2010,  Continued episodes of paroxysmal atrial fibrillation, previously declined anticoagulation and preferred to take pradaxa as needed  stress at home, daughter had cancer, died in 12/04/16Partial colectomy 11/2016 Noncompliance on eliquis, Chads Vasc of  3  Dilated aorta 4 cm  who presents for routine followup of his atrial fibrillation  Last seen in clinic by myself 3/23  [place] farm, has donkeys, remodeling barn Does exercise, [employer] Plays golf, plays with others  Rare afib episodes, lasts 14 hrs Takes amiodarone, metoprolol More afib when over tired, eats late  BP elevated in Am, better in the PM 120s/70 Takes both of his blood pressure medications Cardizem and lisinopril at noon  Times takes Eliquis once a day, other days twice a day Rare nosebleeds  EKG personally reviewed by myself on todays visit EKG Interpretation Date/Time:  Tuesday September 03 2023 10:33:29 EDT Ventricular Rate:  63 PR Interval:  196 QRS Duration:  106 QT Interval:  420 QTC Calculation: 429 R Axis:   -20  Text Interpretation: Normal sinus rhythm Normal ECG When compared with ECG of 01-Nov-2009 10:28, No significant change was found Confirmed by Julien Nordmann [phone]) on 09/03/2023 10:36:05 AM   Other past medical history reviewed Zio   sinus Rhythm.  --82 Supraventricular Tachycardia runs occurred,the longest lasting 18 beats with an avg rate of  144 bpm.  Isolated VEs were frequent (12.5%, 191478), VE Couplets were rare (<1.0%, 927), and VE Triplets were rare (<1.0%, 13).  Ventricular Bigeminy and Trigeminy were present.   Echo 07/17/2019,   1. The left ventricle has normal systolic function with an ejection fraction of 60-65%.   dilatation of the aortic root and of the ascending aorta 4.0 cm.  Stress test performed with no significant ischemia Mild aortic atherosclerosis seen on CT, images pulled up and discussed with him  5 episodes of atrial fib, most recently in 2018-10-15 Often lasting >15 hours Previously took amiodarone as needed,   episode of atrial fibrillation 06/11/2014. This lasted less than one day. He does take amiodarone as needed to restore normal sinus rhythm.  Since the event at the end of July, he is taking metoprolol 12.5 mg twice a day He also takes extra metoprolol for breakthrough arrhythmia   Previous episodes of atrial fibrillation in 01/21/2013 lasting 15 hours and 06/26/2015 lasting less than 24 hours.  In August 2014, he had had his esophagus stretched and he feels that this attributed to his recurrent arrhythmia.    cardiac catheterization in June 2007 that showed no significant coronary artery disease. Ejection fraction at that time was 45-55% and he was in atrial fibrillation with a rapid rate.    PMH:   has a past medical history of Arthritis, Basal cell carcinoma of cheek, Cerumen impaction (06/05/2016), CHF (congestive heart failure) (HCC), Colon cancer (HCC) (10/27/2016), Complication of anesthesia, Erosive esophagitis (2014), Paget's disease, Paroxysmal atrial fibrillation (HCC), and Prostate  cancer (HCC) (1998).  PSH:    Past Surgical History:  Procedure Laterality Date   BASAL CELL CARCINOMA EXCISION     CARDIAC CATHETERIZATION  2007   No significant CAD   CARDIOVERSION  2010   A-Fib   CATARACT EXTRACTION     COLONOSCOPY  01/1999   radiation proctitis, rec rpt 5 yrs ([facility])    COLONOSCOPY  09/2016   invasive adenocacinoma araising in TVA - referred to surgery, radiation proctitis, rpt 1 yr Randa Evens)   ESOPHAGOGASTRODUODENOSCOPY N/A 02/24/2013   esoph stricture dilated, marked erosive esophagitis; Surgeon: Vertell Novak., MD   FLEXIBLE SIGMOIDOSCOPY  10/2016   malignant tumor recto-sigmoid colon, diverticulosis - referred to surgery Randa Evens)   HAMMER TOE SURGERY  04/21/2012   amputation for paget's   INGUINAL HERNIA REPAIR  2007   LAPAROSCOPIC SIGMOID COLECTOMY N/A 11/22/2016   path: no residual carcinoma, 13 benign LN, diverticulosis Luretha Murphy, MD)   RADIOACTIVE SEED IMPLANT  1998   Prostate cancer Earlene Plater)   SAVORY DILATION N/A 02/24/2013   Procedure: Gaspar Bidding DILATION;  Surgeon: Vertell Novak., MD;  Location: [facility];  Service: Endoscopy;  Laterality: N/A;   SHOULDER SURGERY Right 2004   RTC    Current Outpatient Medications  Medication Sig Dispense Refill   amiodarone (PACERONE) 200 MG tablet Take 1 tablet (200 mg) by mouth every 4-6 hours as needed for a-fib episodes 60 tablet 1   apixaban (ELIQUIS) 5 MG TABS tablet Take 1 tablet (5 mg total) by mouth 2 (two) times daily. 180 tablet 3   cholecalciferol (VITAMIN D) 1000 UNITS tablet Take 5,000 Units by mouth every morning.      diltiazem (CARDIZEM CD) 120 MG 24 hr capsule Take 1 capsule by mouth once daily 30 capsule 0   fish oil-omega-3 fatty acids 1000 MG capsule Take 1 g by mouth every morning.      glucosamine-chondroitin 500-400 MG tablet Take 1 tablet by mouth daily.     lisinopril (ZESTRIL) 20 MG tablet Take 1 tablet by mouth once daily 30 tablet 0   Magnesium 400 MG CAPS Take 400 mg by mouth every morning.      metoprolol tartrate (LOPRESSOR) 25 MG tablet Take 1 tablet (25 mg) by mouth every 6 hours for needed for a-fib episodes. Do not exceed 3 tablets per day. 60 tablet 1   Multiple Vitamin (MULTIVITAMIN WITH MINERALS) TABS Take 1 tablet by mouth every morning.     NON  FORMULARY 2 scoop 2 (two) times daily as needed (calm plus - when in A-fib). Reported on 05/28/2016     NON FORMULARY Co-Q-10 30 mg/L-Carnitine-250 mg/Ashwagandha-475mg      RESVERATROL PO Take 20 mg by mouth daily.     Turmeric (QC TUMERIC COMPLEX PO) Take by mouth.     vitamin C (ASCORBIC ACID) 500 MG tablet Take 1,000 mg by mouth 2 (two) times daily.      No current facility-administered medications for this visit.    Allergies:   Patient has no known allergies.   Social History:  The patient  reports that he quit smoking about 14 years ago. His smoking use included cigars. He has never used smokeless tobacco. He reports current alcohol use of about 1.0 standard drink of alcohol per week. He reports that he does not use drugs.   Family History:   family history includes Arrhythmia in his mother; Arthritis in his mother; Cancer in his brother; Diabetes in his mother; Stroke in his  father.    Review of Systems: Review of Systems  Constitutional: Negative.   HENT: Negative.    Respiratory: Negative.    Cardiovascular:  Positive for palpitations.  Gastrointestinal: Negative.   Musculoskeletal: Negative.   Neurological: Negative.   Psychiatric/Behavioral: Negative.    All other systems reviewed and are negative.  PHYSICAL EXAM: VS:  BP (!) 140/50 (BP Location: Left Arm, Patient Position: Sitting, Cuff Size: Normal)   Pulse 63   Ht 5\' 11"  (1.803 m)   Wt 190 lb 4 oz (86.3 kg)   SpO2 97%   BMI 26.53 kg/m  , BMI Body mass index is 26.53 kg/m. Constitutional:  oriented to person, place, and time. No distress.  HENT:  Head: Grossly normal Eyes:  no discharge. No scleral icterus.  Neck: No JVD, no carotid bruits  Cardiovascular: Regular rate and rhythm, no murmurs appreciated Pulmonary/Chest: Clear to auscultation bilaterally, no wheezes or rails Abdominal: Soft.  no distension.  no tenderness.  Musculoskeletal: Normal range of motion Neurological:  normal muscle tone. Coordination  normal. No atrophy Skin: Skin warm and dry Psychiatric: normal affect, pleasant  Recent Labs: No results found for requested labs within last 365 days.    Lipid Panel Lab Results  Component Value Date   CHOL 200 07/24/2021   HDL 63 07/24/2021   LDLCALC 124 (H) 07/24/2021   TRIG 65 07/24/2021    Wt Readings from Last 3 Encounters:  09/03/23 190 lb 4 oz (86.3 kg)  02/06/22 201 lb 2 oz (91.2 kg)  08/04/21 196 lb 2 oz (89 kg)     ASSESSMENT AND PLAN:  Paroxysmal atrial fibrillation (HCC) - Rare breakthrough episodes of atrial fibrillation, predominantly normal sinus Rest importance of eliquis  5 twice a day, times takes it once a day, occasional nosebleeds on diltiazem ER 120 at noon Reports he has amio and metoprolol to take as needed For worsening episodes could increase diltiazem up to 180 daily  Mixed hyperlipidemia - He does not want a statin or Zetia mild aortic atherosclerosis seen on CT scan Total cholesterol 180 LDL over 100  Paget's disease of bone - Prior history of elevated alkaline phosphatase, being monitored  History of smoking -  Recommended smoking cessation  HTN takes his lisinopril and diltiazem  Higher blood pressure in the morning as he takes both of his medications at noon  Aortic atherosclerosis Mild on CT, previously discussed  Mildly dilated ascending aorta 4 cm, not recently evaluated Follow-up imaging in next clinic visit    Orders Placed This Encounter  Procedures   EKG 12-Lead     Signed, Dossie Arbour, M.D., Ph.D. 09/03/2023  [facility] [place], [place] [phone]

## 2023-09-03 ENCOUNTER — Ambulatory Visit: Payer: Medicare Other | Attending: Cardiovascular Disease | Admitting: Cardiovascular Disease

## 2023-09-03 ENCOUNTER — Encounter: Payer: Self-pay | Admitting: Cardiovascular Disease

## 2023-09-03 VITALS — BP 140/50 | HR 63 | Ht 71.0 in | Wt 190.2 lb

## 2023-09-03 DIAGNOSIS — I471 Supraventricular tachycardia, unspecified: Secondary | ICD-10-CM | POA: Diagnosis not present

## 2023-09-03 DIAGNOSIS — E785 Hyperlipidemia, unspecified: Secondary | ICD-10-CM | POA: Diagnosis not present

## 2023-09-03 DIAGNOSIS — I480 Paroxysmal atrial fibrillation: Secondary | ICD-10-CM | POA: Diagnosis not present

## 2023-09-03 DIAGNOSIS — I10 Essential (primary) hypertension: Secondary | ICD-10-CM

## 2023-09-03 DIAGNOSIS — Z87891 Personal history of nicotine dependence: Secondary | ICD-10-CM

## 2023-09-03 MED ORDER — DILTIAZEM HCL ER COATED BEADS 120 MG PO CP24: 120.0000 mg | ORAL_CAPSULE | Freq: Every day | ORAL | 3 refills | Status: DC

## 2023-09-03 MED ORDER — LISINOPRIL 20 MG PO TABS: 20.0000 mg | ORAL_TABLET | Freq: Every day | ORAL | 3 refills | Status: DC

## 2023-09-03 NOTE — Patient Instructions (Signed)

## 2024-01-10 ENCOUNTER — Other Ambulatory Visit: Payer: Self-pay | Admitting: Cardiovascular Disease

## 2024-01-16 ENCOUNTER — Telehealth: Payer: Self-pay | Admitting: Cardiovascular Disease

## 2024-01-16 NOTE — Telephone Encounter (Signed)
 Follow Up:       The medicine patient needs is Amiodarone.

## 2024-01-16 NOTE — Telephone Encounter (Signed)
*  STAT* If patient is at the pharmacy, call can be transferred to refill team.   1. Which medications need to be refilled? (please list name of each medication and dose if known)    2. Would you like to learn more about the convenience, safety, & potential cost savings by using the [facility]?    3. Are you open to using the [facility] (Type [facility].   4. Which pharmacy/location (including street and city if local pharmacy) is medication to be sent to?  [facility] [address] [place]   5. Do they need a 30 day or 90 day supply? #60 and refills- pt is out of medicine, please call today

## 2024-01-16 NOTE — Telephone Encounter (Signed)
 Disp Refills Start End   amiodarone (PACERONE) 200 MG tablet 60 tablet 0 01/13/2024 --   Sig: TAKE 1 TABLET BY MOUTH EVERY 4 TO 6 HOURS AS NEEDED FOR A-FIB EPISODES   Sent to pharmacy as: amiodarone (PACERONE) 200 MG tablet   E-Prescribing Status: Receipt confirmed by pharmacy (01/13/2024  7:41 AM EST)

## 2024-02-01 DIAGNOSIS — R350 Frequency of micturition: Secondary | ICD-10-CM | POA: Insufficient documentation

## 2024-07-09 ENCOUNTER — Encounter: Payer: Self-pay | Admitting: Cardiovascular Disease

## 2024-08-23 ENCOUNTER — Other Ambulatory Visit: Payer: Self-pay | Admitting: Cardiovascular Disease

## 2024-08-24 ENCOUNTER — Other Ambulatory Visit: Payer: Self-pay | Admitting: Cardiovascular Disease

## 2024-08-28 DIAGNOSIS — C187 Malignant neoplasm of sigmoid colon: Secondary | ICD-10-CM | POA: Insufficient documentation

## 2024-08-28 DIAGNOSIS — Z85038 Personal history of other malignant neoplasm of large intestine: Secondary | ICD-10-CM | POA: Insufficient documentation

## 2024-08-28 DIAGNOSIS — K222 Esophageal obstruction: Secondary | ICD-10-CM | POA: Insufficient documentation

## 2024-08-28 DIAGNOSIS — Z800 Family history of malignant neoplasm of digestive organs: Secondary | ICD-10-CM | POA: Insufficient documentation

## 2024-08-31 ENCOUNTER — Telehealth: Payer: Self-pay

## 2024-08-31 ENCOUNTER — Ambulatory Visit: Admission: EM | Admit: 2024-08-31 | Discharge: 2024-08-31 | Disposition: A | Discharge status: Home / Self Care

## 2024-08-31 VITALS — BP 153/71 | HR 57 | Temp 97.5°F | Resp 18 | Ht 71.0 in | Wt 190.3 lb

## 2024-08-31 DIAGNOSIS — H6123 Impacted cerumen, bilateral: Secondary | ICD-10-CM

## 2024-08-31 MED ORDER — CARBAMIDE PEROXIDE 6.5 % OT SOLN
5.0000 [drp] | Freq: Once | OTIC | Status: AC
  Administered 2024-08-31: 5 [drp] via OTIC

## 2024-08-31 MED ORDER — CIPROFLOXACIN-DEXAMETHASONE 0.3-0.1 % OT SUSP: 4.0000 [drp] | Freq: Two times a day (BID) | OTIC | 0 refills | Status: DC

## 2024-08-31 NOTE — Discharge Instructions (Signed)
  1. Bilateral impacted cerumen (Primary) - carbamide peroxide (DEBROX) 6.5 % OTIC (EAR) solution 5 drop to bilateral ears for cerumen removal - Ear wax removal completed in UC by CMA, complete cerumen removal from bilateral ear canals, tympanic membrane appears intact, no erythema, moderate inflammation to bilateral ear canals. - ciprofloxacin-dexamethasone  (CIPRODEX) OTIC suspension; Place 4 drops into both ears 2 (two) times daily.  Dispense: 7.5 mL; Refill: 0 -Continue to monitor symptoms for any change in severity if there is any escalation of current symptoms or development of new symptoms follow-up in ER for further evaluation and management.

## 2024-08-31 NOTE — Telephone Encounter (Signed)
 Rx change (to [facility]) per patient. Discussed with provider.

## 2024-08-31 NOTE — ED Provider Notes (Signed)
 [facility]  Note:  This document was prepared using Conservation officer, historic buildings and may include unintentional dictation errors.  MRN: [id number] DOB: Feb 11, 1941  Subjective:   [name] [name] is a 83 y.o. male presenting for evaluation of bilateral ears due to cerumen impaction.  Patient reports that he feels that bilateral ears are full and clogged.  Patient reports previous issues with cerumen impaction.  Patient states that he does wear hearing aids and recently went to be evaluated to see if he could upgrade his hearing aids when they informed him that his ear canals were blocked with cerumen and advised him to follow-up for further evaluation and irrigation and urgent care or primary care physician's office.  Patient denies any ear pain, drainage, fever, dizziness.  No current facility-administered medications for this encounter.  Current Outpatient Medications:    ciprofloxacin-dexamethasone  (CIPRODEX) OTIC suspension, Place 4 drops into both ears 2 (two) times daily., Disp: 7.5 mL, Rfl: 0   amiodarone  (PACERONE ) 200 MG tablet, TAKE 1 TABLET BY MOUTH EVERY 4 TO 6 HOURS AS NEEDED FOR  A-FIB  EPISODES, Disp: 60 tablet, Rfl: 0   amiodarone  (PACERONE ) 400 MG tablet, one tab orally three times a day as needed, Disp: , Rfl:    apixaban  (ELIQUIS ) 5 MG TABS tablet, Take 1 tablet (5 mg total) by mouth 2 (two) times daily., Disp: 180 tablet, Rfl: 3   Ashwagandha 500 MG CAPS, 475mg  1 tab Orally, Disp: , Rfl:    aspirin 81 MG chewable tablet, 1 tab Oral, Disp: , Rfl:    cholecalciferol  (VITAMIN D ) 1000 UNITS tablet, Take 5,000 Units by mouth every morning. , Disp: , Rfl:    Coenzyme Q10 (CO Q 10) 100 MG CAPS, 1000 mg Orally, Disp: , Rfl:    diltiazem  (CARDIZEM  CD) 120 MG 24 hr capsule, Take 1 capsule (120 mg total) by mouth daily., Disp: 90 capsule, Rfl: 3   diltiazem  (CARDIZEM ) 120 MG tablet, Take 120 mg by mouth as directed., Disp: , Rfl:    fish oil-omega-3 fatty acids 1000 MG  capsule, Take 1 g by mouth every morning. , Disp: , Rfl:    Glucosamine 500 MG CAPS, 375mg  1 tab Orally, Disp: , Rfl:    glucosamine-chondroitin 500-400 MG tablet, Take 1 tablet by mouth daily., Disp: , Rfl:    lisinopril  (ZESTRIL ) 20 MG tablet, Take 1 tablet (20 mg total) by mouth daily., Disp: 90 tablet, Rfl: 3   Magnesium  400 MG CAPS, Take 400 mg by mouth every morning. , Disp: , Rfl:    methocarbamol  (ROBAXIN ) 500 MG tablet, 1 tablet Orally, Disp: , Rfl:    metoprolol  tartrate (LOPRESSOR ) 25 MG tablet, TAKE 1 TABLET BY MOUTH EVERY 6 HOURS AS NEEDED FOR A-FIB EPISODES. DO NOT EXCEED 3 TABLETS PER DAY, Disp: 60 tablet, Rfl: 0   Misc Natural Products (TART CHERRY ADVANCED) CAPS, 500mg  Orally, Disp: , Rfl:    Multiple Vitamin (MULTIVITAMIN WITH MINERALS) TABS, Take 1 tablet by mouth every morning., Disp: , Rfl:    NON FORMULARY, 2 scoop 2 (two) times daily as needed (calm plus - when in A-fib). Reported on 05/28/2016, Disp: , Rfl:    NON FORMULARY, Co-Q-10 30 mg/L-Carnitine-250 mg/Ashwagandha-475mg , Disp: , Rfl:    RESVERATROL PO, Take 20 mg by mouth daily., Disp: , Rfl:    Sodium Caprylate POWD, Take by mouth., Disp: , Rfl:    Turmeric (QC TUMERIC COMPLEX PO), Take by mouth., Disp: , Rfl:    vitamin C  (  ASCORBIC ACID ) 500 MG tablet, Take 1,000 mg by mouth 2 (two) times daily. , Disp: , Rfl:    Vitamin D , Ergocalciferol , (DRISDOL) 1.25 MG (50000 UNIT) CAPS capsule, 1 capsule., Disp: , Rfl:    No Known Allergies  Past Medical History:  Diagnosis Date   Arthritis    Basal cell carcinoma of cheek    Cerumen impaction 06/05/2016   CHF (congestive heart failure) (HCC)    history of this   Colon cancer (HCC) 10/27/2016   Found on flex sig 2017   Complication of anesthesia    at 09/2016-had high BP with colonoscopy.   Erosive esophagitis 2014   by EGD [name])   Paget's disease    Dr. [facility])   Paroxysmal atrial fibrillation [facility])    Prostate cancer [facility]) 1998   s/p  seed implants [name] in remission     Past Surgical History:  Procedure Laterality Date   BASAL CELL CARCINOMA EXCISION     CARDIAC CATHETERIZATION  2007   No significant CAD   CARDIOVERSION  2010   A-Fib   CATARACT EXTRACTION     COLONOSCOPY  01/1999   radiation proctitis, rec rpt 5 yrs [name])   COLONOSCOPY  09/2016   invasive adenocacinoma araising in TVA - referred to surgery, radiation proctitis, rpt 1 yr [name])   ESOPHAGOGASTRODUODENOSCOPY N/A 02/24/2013   esoph stricture dilated, marked erosive esophagitis; Surgeon: Lynwood LITTIE Celestia Mickey., MD   FLEXIBLE SIGMOIDOSCOPY  10/2016   malignant tumor recto-sigmoid colon, diverticulosis - referred to surgery [name])   HAMMER TOE SURGERY  04/21/2012   amputation for paget's   INGUINAL HERNIA REPAIR  2007   LAPAROSCOPIC SIGMOID COLECTOMY N/A 11/22/2016   path: no residual carcinoma, 13 benign LN, diverticulosis [name], MD)   RADIOACTIVE SEED IMPLANT  1998   Prostate cancer [name]   SAVORY DILATION N/A 02/24/2013   Procedure: HARLEY DILATION;  Surgeon: Lynwood LITTIE Celestia Mickey., MD;  Location: [facility];  Service: Endoscopy;  Laterality: N/A;   SHOULDER SURGERY Right 2004   RTC    Family History  Problem Relation Age of Onset   Arthritis Mother    Arrhythmia Mother    Diabetes Mother    Stroke Father    Cancer Brother        prostate   CAD Neg Hx     Social History   Tobacco Use   Smoking status: Former    Types: Cigars    Quit date: 03/11/2009    Years since quitting: 15.4   Smokeless tobacco: Never  Vaping Use   Vaping status: Never Used  Substance Use Topics   Alcohol use: Not Currently    Alcohol/week: 1.0 standard drink of alcohol    Types: 1 Standard drinks or equivalent per week    Comment: Occasional   Drug use: Never    ROS Refer to HPI for ROS details.  Objective:   Vitals: BP (!) 153/71 (BP Location: Left Arm)   Pulse (!) 57   Temp (!) 97.5 F (36.4 C) (Oral)   Resp 18   Ht 5'  11 (1.803 m)   Wt 190 lb 4.1 oz (86.3 kg)   SpO2 96%   BMI 26.54 kg/m   Physical Exam Vitals and nursing note reviewed.  Constitutional:      General: He is not in acute distress.    Appearance: He is well-developed. He is not ill-appearing or toxic-appearing.  HENT:     Head: Normocephalic.  Right Ear: Ear canal and external ear normal. Decreased hearing noted. No drainage, swelling or tenderness. There is impacted cerumen.     Left Ear: Ear canal and external ear normal. Decreased hearing noted. No drainage, swelling or tenderness. There is impacted cerumen.  Cardiovascular:     Rate and Rhythm: Normal rate.  Pulmonary:     Effort: Pulmonary effort is normal. No respiratory distress.  Skin:    General: Skin is warm and dry.  Neurological:     General: No focal deficit present.     Mental Status: He is alert and oriented to person, place, and time.  Psychiatric:        Mood and Affect: Mood normal.        Behavior: Behavior normal.     Procedures  No results found for this or any previous visit (from the past 24 hours).  No results found.   Assessment and Plan :     Discharge Instructions       1. Bilateral impacted cerumen (Primary) - carbamide peroxide (DEBROX) 6.5 % OTIC (EAR) solution 5 drop to bilateral ears for cerumen removal - Ear wax removal completed in UC by CMA, complete cerumen removal from bilateral ear canals, tympanic membrane appears intact, no erythema, moderate inflammation to bilateral ear canals. - ciprofloxacin-dexamethasone  (CIPRODEX) OTIC suspension; Place 4 drops into both ears 2 (two) times daily.  Dispense: 7.5 mL; Refill: 0 -Continue to monitor symptoms for any change in severity if there is any escalation of current symptoms or development of new symptoms follow-up in ER for further evaluation and management.       Alexee Delsanto B Juandiego Kolenovic   Michai Dieppa, Hazen B, NP 08/31/24 1046

## 2024-08-31 NOTE — ED Triage Notes (Addendum)
 Patient reports needing wax removed in ears, he was having his hearing aides updated recently and before the provider/audiologist can do that his ears have to be cleaned out. No pain.

## 2024-09-02 ENCOUNTER — Telehealth: Payer: Self-pay | Admitting: Cardiovascular Disease

## 2024-09-02 MED ORDER — APIXABAN 5 MG PO TABS: 5.0000 mg | ORAL_TABLET | Freq: Two times a day (BID) | ORAL | 1 refills | Status: DC

## 2024-09-02 NOTE — Telephone Encounter (Signed)
 Prescription refill request for Eliquis  received. Indication:  A-Fib Last office visit:  09/03/2023 Scr:  1.15  scan Lab on 07/09/2024 Age: 83 yrs. Weight:  86.3 kg  Pt has an upcoming appt with Dr. Gollan on 10/19/2024. Pt want Rx emailed to him. Please address

## 2024-09-02 NOTE — Telephone Encounter (Signed)
*  STAT* If patient is at the pharmacy, call can be transferred to refill team.   1. Which medications need to be refilled? (please list name of each medication and dose if known)   apixaban  (ELIQUIS ) 5 MG TABS tablet   2. Would you like to learn more about the convenience, safety, & potential cost savings by using the [facility]?   3. Are you open to using the [facility] (Type [facility]. ).  4. Which pharmacy/location (including street and city if local pharmacy) is medication to be sent to?  [facility] [place]  5. Do they need a 30 day or 90 day supply?   90 day  Patient stated he gets his medication from a [place] pharmacy and wants his prescription emailed to him.  Patient has appointment scheduled with Dr. Gollan on 12/8.

## 2024-09-02 NOTE — Telephone Encounter (Signed)
 Prescription refill request for Eliquis  received. Indication: PAF Last office visit: 09/03/23  ONEIDA Lunger MD Scr: 1.15 on 07/02/24  Epic Age: 83 Weight: 86.3kg  Based on above findings Eliquis  5mg  twice daily is the appropriate dose.  Refill approved.

## 2024-09-02 NOTE — Telephone Encounter (Signed)
 Prescription refill request for Eliquis  received. Indication:afib Last office visit:upcoming Scr:1.15  8/25 Age: 83 Weight:86.3  kg  Prescription refilled

## 2024-09-04 ENCOUNTER — Other Ambulatory Visit: Payer: Self-pay | Admitting: Emergency Medicine

## 2024-09-04 MED ORDER — APIXABAN 5 MG PO TABS: 5.0000 mg | ORAL_TABLET | Freq: Two times a day (BID) | ORAL | 3 refills | Status: AC

## 2024-09-04 NOTE — Telephone Encounter (Signed)
 Patient called again wanting his prescription emailed to [email or web address].  Patient noted he only has 2 days left of this medication and the prescription will be coming from [place].

## 2024-09-04 NOTE — Telephone Encounter (Signed)
 Spoke with patient. Informed him that I have the prescription printed and will have it signed by Dr. Gollan on Monday. Offered samples to pick up on Monday. Patient verbalized understanding.

## 2024-09-04 NOTE — Telephone Encounter (Signed)
 Called patient and left message for call back.

## 2024-09-04 NOTE — Telephone Encounter (Signed)
 Pt has an upcoming appt with Dr. Gollan on 10/19/2024. Pt want Rx emailed to him. Please address   Script was sent to pharmacy but script should have been printed. Patient wants it emailed to him. Please advise.

## 2024-09-07 MED ORDER — APIXABAN 5 MG PO TABS: 5.0000 mg | ORAL_TABLET | Freq: Two times a day (BID) | ORAL | 0 refills | Status: DC

## 2024-09-07 NOTE — Addendum Note (Signed)
 Addended by: ESTELLE OLAM GRADE on: 09/07/2024 03:08 PM   Modules accepted: Orders

## 2024-10-09 ENCOUNTER — Other Ambulatory Visit: Payer: Self-pay | Admitting: Cardiovascular Disease

## 2024-10-09 DIAGNOSIS — E785 Hyperlipidemia, unspecified: Secondary | ICD-10-CM

## 2024-10-09 DIAGNOSIS — I471 Supraventricular tachycardia, unspecified: Secondary | ICD-10-CM

## 2024-10-09 DIAGNOSIS — I10 Essential (primary) hypertension: Secondary | ICD-10-CM

## 2024-10-12 ENCOUNTER — Other Ambulatory Visit: Payer: Self-pay | Admitting: Cardiovascular Disease

## 2024-10-12 DIAGNOSIS — I471 Supraventricular tachycardia, unspecified: Secondary | ICD-10-CM

## 2024-10-12 DIAGNOSIS — E785 Hyperlipidemia, unspecified: Secondary | ICD-10-CM

## 2024-10-12 DIAGNOSIS — I10 Essential (primary) hypertension: Secondary | ICD-10-CM

## 2024-10-18 NOTE — Progress Notes (Deleted)
 Cardiology Office Note  Date:  10/18/2024   ID:  ISIAC BREIGHNER, DOB 26-Mar-1941, MRN [id number]  PCP:  Burney Darice CROME, MD   No chief complaint on file.   HPI:  Mr. Bann is a 83 year old gentleman, history of  padgets, toe amputation on the left   hyperlipidemia ,  cardiac catheterization in June 2007 that showed no significant coronary artery disease. paroxysmal atrial fibrillation with cardioversion in December of 2010,  Continued episodes of paroxysmal atrial fibrillation, previously declined anticoagulation and preferred to take pradaxa  as needed  stress at home, daughter had cancer, died in November 22, 2016Partial colectomy 11/2016 Noncompliance on eliquis , Chads Vasc of  3  Dilated aorta 4 cm  who presents for routine followup of his atrial fibrillation  Last seen in clinic by myself 10/24  Active on the farm, has donkeys, remodeling barn Does exercise, [employer] Plays golf, plays with others  Rare afib episodes, lasts 14 hrs Takes amiodarone , metoprolol  More afib when over tired, eats late  BP elevated in Am, better in the PM 120s/70 Takes both of his blood pressure medications Cardizem  and lisinopril  at noon  Times takes Eliquis  once a day, other days twice a day Rare nosebleeds  EKG personally reviewed by myself on todays visit     Other past medical history reviewed Zio   sinus Rhythm.  --82 Supraventricular Tachycardia runs occurred,the longest lasting 18 beats with an avg rate of 144 bpm.  Isolated VEs were frequent (12.5%, 848217), VE Couplets were rare (<1.0%, 927), and VE Triplets were rare (<1.0%, 13).  Ventricular Bigeminy and Trigeminy were present.   Echo 07/17/2019,   1. The left ventricle has normal systolic function with an ejection fraction of 60-65%.   dilatation of the aortic root and of the ascending aorta 4.0 cm.  Stress test performed with no significant ischemia Mild aortic atherosclerosis seen on CT, images pulled up and discussed  with him  5 episodes of atrial fib, most recently in 10-03-2018 Often lasting >15 hours Previously took amiodarone  as needed,   episode of atrial fibrillation 06/11/2014. This lasted less than one day. He does take amiodarone  as needed to restore normal sinus rhythm.  Since the event at the end of July, he is taking metoprolol  12.5 mg twice a day He also takes extra metoprolol  for breakthrough arrhythmia   Previous episodes of atrial fibrillation in 01/21/2013 lasting 15 hours and 06/26/2015 lasting less than 24 hours.  In August 2014, he had had his esophagus stretched and he feels that this attributed to his recurrent arrhythmia.    cardiac catheterization in June 2007 that showed no significant coronary artery disease. Ejection fraction at that time was 45-55% and he was in atrial fibrillation with a rapid rate.    PMH:   has a past medical history of Arthritis, Basal cell carcinoma of cheek, Cerumen impaction (06/05/2016), CHF (congestive heart failure) (HCC), Colon cancer (HCC) (10/27/2016), Complication of anesthesia, Erosive esophagitis (2014), Paget's disease, Paroxysmal atrial fibrillation (HCC), and Prostate cancer (HCC) (1998).  PSH:    Past Surgical History:  Procedure Laterality Date   BASAL CELL CARCINOMA EXCISION     CARDIAC CATHETERIZATION  2007   No significant CAD   CARDIOVERSION  2010   A-Fib   CATARACT EXTRACTION     COLONOSCOPY  01/1999   radiation proctitis, rec rpt 5 yrs ([facility])   COLONOSCOPY  2016-10-03   invasive adenocacinoma araising in [place] - referred to surgery, radiation proctitis, rpt 1 yr (  [facility])   ESOPHAGOGASTRODUODENOSCOPY N/A 02/24/2013   esoph stricture dilated, marked erosive esophagitis; Surgeon: Lynwood LITTIE Celestia Mickey., MD   FLEXIBLE SIGMOIDOSCOPY  10/2016   malignant tumor recto-sigmoid colon, diverticulosis - referred to surgery [name])   HAMMER TOE SURGERY  04/21/2012   amputation for paget's   INGUINAL HERNIA REPAIR  2007   LAPAROSCOPIC  SIGMOID COLECTOMY N/A 11/22/2016   path: no residual carcinoma, 13 benign LN, diverticulosis [name], MD)   RADIOACTIVE SEED IMPLANT  1998   Prostate cancer [name]   SAVORY DILATION N/A 02/24/2013   Procedure: HARLEY DILATION;  Surgeon: Lynwood LITTIE Celestia Mickey., MD;  Location: [facility];  Service: Endoscopy;  Laterality: N/A;   SHOULDER SURGERY Right 2004   RTC    Current Outpatient Medications  Medication Sig Dispense Refill   amiodarone  (PACERONE ) 200 MG tablet TAKE 1 TABLET BY MOUTH EVERY 4 TO 6 HOURS AS NEEDED FOR  A-FIB  EPISODES 60 tablet 0   amiodarone  (PACERONE ) 400 MG tablet one tab orally three times a day as needed     apixaban  (ELIQUIS ) 5 MG TABS tablet Take 1 tablet (5 mg total) by mouth 2 (two) times daily. 180 tablet 3   apixaban  (ELIQUIS ) 5 MG TABS tablet Take 1 tablet (5 mg total) by mouth 2 (two) times daily. 28 tablet 0   Ashwagandha 500 MG CAPS 475mg  1 tab Orally     aspirin 81 MG chewable tablet 1 tab Oral     cholecalciferol  (VITAMIN D ) 1000 UNITS tablet Take 5,000 Units by mouth every morning.      ciprofloxacin -dexamethasone  (CIPRODEX ) OTIC suspension Place 4 drops into both ears 2 (two) times daily. 7.5 mL 0   Coenzyme Q10 (CO Q 10) 100 MG CAPS 1000 mg Orally     [START ON 10/19/2024] diltiazem  (CARDIZEM  CD) 120 MG 24 hr capsule Take 1 capsule (120 mg total) by mouth daily. 90 capsule 1   diltiazem  (CARDIZEM ) 120 MG tablet Take 120 mg by mouth as directed.     fish oil-omega-3 fatty acids 1000 MG capsule Take 1 g by mouth every morning.      Glucosamine 500 MG CAPS 375mg  1 tab Orally     glucosamine-chondroitin 500-400 MG tablet Take 1 tablet by mouth daily.     lisinopril  (ZESTRIL ) 20 MG tablet Take 1 tablet by mouth once daily 30 tablet 0   Magnesium  400 MG CAPS Take 400 mg by mouth every morning.      methocarbamol  (ROBAXIN ) 500 MG tablet 1 tablet Orally     metoprolol  tartrate (LOPRESSOR ) 25 MG tablet TAKE 1 TABLET BY MOUTH EVERY 6 HOURS AS NEEDED FOR  A-FIB EPISODES. DO NOT EXCEED 3 TABLETS PER DAY 60 tablet 0   Misc Natural Products (TART CHERRY ADVANCED) CAPS 500mg  Orally     Multiple Vitamin (MULTIVITAMIN WITH MINERALS) TABS Take 1 tablet by mouth every morning.     NON FORMULARY 2 scoop 2 (two) times daily as needed (calm plus - when in A-fib). Reported on 05/28/2016     NON FORMULARY Co-Q-10 30 mg/L-Carnitine-250 mg/Ashwagandha-475mg      RESVERATROL PO Take 20 mg by mouth daily.     Sodium Caprylate POWD Take by mouth.     Turmeric (QC TUMERIC COMPLEX PO) Take by mouth.     vitamin C  (ASCORBIC ACID ) 500 MG tablet Take 1,000 mg by mouth 2 (two) times daily.      Vitamin D , Ergocalciferol , (DRISDOL) 1.25 MG (50000 UNIT) CAPS capsule 1 capsule.  No current facility-administered medications for this visit.    Allergies:   Patient has no known allergies.   Social History:  The patient  reports that he quit smoking about 15 years ago. His smoking use included cigars. He has never used smokeless tobacco. He reports that he does not currently use alcohol after a past usage of about 1.0 standard drink of alcohol per week. He reports that he does not use drugs.   Family History:   family history includes Arrhythmia in his mother; Arthritis in his mother; Cancer in his brother; Diabetes in his mother; Stroke in his father.    Review of Systems: Review of Systems  Constitutional: Negative.   HENT: Negative.    Respiratory: Negative.    Cardiovascular:  Positive for palpitations.  Gastrointestinal: Negative.   Musculoskeletal: Negative.   Neurological: Negative.   Psychiatric/Behavioral: Negative.    All other systems reviewed and are negative.  PHYSICAL EXAM: VS:  There were no vitals taken for this visit. , BMI There is no height or weight on file to calculate BMI. Constitutional:  oriented to person, place, and time. No distress.  HENT:  Head: Grossly normal Eyes:  no discharge. No scleral icterus.  Neck: No JVD, no carotid  bruits  Cardiovascular: Regular rate and rhythm, no murmurs appreciated Pulmonary/Chest: Clear to auscultation bilaterally, no wheezes or rails Abdominal: Soft.  no distension.  no tenderness.  Musculoskeletal: Normal range of motion Neurological:  normal muscle tone. Coordination normal. No atrophy Skin: Skin warm and dry Psychiatric: normal affect, pleasant  Recent Labs: No results found for requested labs within last 365 days.    Lipid Panel Lab Results  Component Value Date   CHOL 200 07/24/2021   HDL 63 07/24/2021   LDLCALC 124 (H) 07/24/2021   TRIG 65 07/24/2021    Wt Readings from Last 3 Encounters:  08/31/24 190 lb 4.1 oz (86.3 kg)  09/03/23 190 lb 4 oz (86.3 kg)  02/06/22 201 lb 2 oz (91.2 kg)     ASSESSMENT AND PLAN:  Paroxysmal atrial fibrillation (HCC) - Rare breakthrough episodes of atrial fibrillation, predominantly normal sinus Rest importance of eliquis   5 twice a day, times takes it once a day, occasional nosebleeds on diltiazem  ER 120 at noon Reports he has amio and metoprolol  to take as needed For worsening episodes could increase diltiazem  up to 180 daily  Mixed hyperlipidemia - He does not want a statin or Zetia mild aortic atherosclerosis seen on CT scan Total cholesterol 180 LDL over 100  Paget's disease of bone - Prior history of elevated alkaline phosphatase, being monitored  History of smoking -  Recommended smoking cessation  HTN takes his lisinopril  and diltiazem   Higher blood pressure in the morning as he takes both of his medications at noon  Aortic atherosclerosis Mild on CT, previously discussed  Mildly dilated ascending aorta 4 cm, not recently evaluated Follow-up imaging in next clinic visit    No orders of the defined types were placed in this encounter.    Signed, Velinda Lunger, M.D., Ph.D. 10/18/2024  [facility] [place], [place] [phone]

## 2024-10-19 ENCOUNTER — Ambulatory Visit: Admitting: Cardiovascular Disease

## 2024-10-19 DIAGNOSIS — E785 Hyperlipidemia, unspecified: Secondary | ICD-10-CM

## 2024-10-19 DIAGNOSIS — Z87891 Personal history of nicotine dependence: Secondary | ICD-10-CM

## 2024-10-19 DIAGNOSIS — I480 Paroxysmal atrial fibrillation: Secondary | ICD-10-CM

## 2024-10-19 DIAGNOSIS — I10 Essential (primary) hypertension: Secondary | ICD-10-CM

## 2024-10-19 DIAGNOSIS — I471 Supraventricular tachycardia, unspecified: Secondary | ICD-10-CM

## 2024-11-11 ENCOUNTER — Other Ambulatory Visit: Payer: Self-pay | Admitting: Cardiovascular Disease

## 2024-11-11 DIAGNOSIS — E785 Hyperlipidemia, unspecified: Secondary | ICD-10-CM

## 2024-11-11 DIAGNOSIS — I10 Essential (primary) hypertension: Secondary | ICD-10-CM

## 2024-12-07 ENCOUNTER — Ambulatory Visit: Admitting: Cardiovascular Disease

## 2024-12-13 NOTE — Progress Notes (Unsigned)
 Cardiology Office Note  Date:  12/15/2024   ID:  [name] [name], DOB [birth year], MRN [id number]  PCP:  [name] Darice CROME, MD   Chief Complaint  Patient presents with   12 month follow up     Doing well.   HPI:  [name] [name] is a 84 year old gentleman, history of  padgets, toe amputation on the left   hyperlipidemia ,  cardiac catheterization in June 2007 that showed no significant coronary artery disease. 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, has a barn  Reports he is active at baseline and on the farm, exercises, [employer], plays golf with several others  Reports having rare afib episodes, lasts 14 hrs Takes amiodarone  1 to 2 pills, metoprolol  PRN More afib when over tired, eats late  Blood pressure relatively well-controlled at home Taking Cardizem  ER 120 daily and lisinopril  20 daily at noon  Times takes Eliquis  twice daily No recent nosebleeds of concern  EKG personally reviewed by myself on todays visit EKG Interpretation Date/Time:  Tuesday December 15 2024 11:12:46 EST Ventricular Rate:  54 PR Interval:  214 QRS Duration:  112 QT Interval:  448 QTC Calculation: 424 R Axis:   -15  Text Interpretation: Sinus bradycardia with 1st degree A-V block When compared with ECG of 03-Sep-2023 10:33, No significant change was found Confirmed by [name] [name] [id number]) on 12/15/2024 11:25:37 AM    Other past medical history reviewed Zio   sinus Rhythm.  --82 Supraventricular Tachycardia runs occurred,the longest lasting 18 beats with  an avg rate of 144 bpm.  Isolated VEs were frequent (12.5%, 848217), VE Couplets were rare (<1.0%, 927), and VE Triplets were rare (<1.0%, 13).  Ventricular Bigeminy and Trigeminy were present.   Echo 07/17/2019,   1. The left ventricle has normal systolic function with an ejection fraction of 60-65%.   dilatation of the aortic root and of the ascending aorta 4.0 cm.  Stress test performed with no significant ischemia Mild aortic atherosclerosis seen on CT, images pulled up and discussed with him  5 episodes of atrial fib, most recently in Oct 13, 2018 Often lasting >15 hours Previously took amiodarone  as needed,   episode of atrial fibrillation 06/11/2014. This lasted less than one day. He does take amiodarone  as needed to restore normal sinus rhythm.  Since the event at the end of July, he is taking metoprolol  12.5 mg twice a day He also takes extra metoprolol  for breakthrough arrhythmia   Previous episodes of atrial fibrillation in 01/21/2013 lasting 15 hours and 06/26/2015 lasting less than 24 hours.  In August 2014, he had had his esophagus stretched and he feels that this attributed to his recurrent arrhythmia.    cardiac catheterization in June 2007 that showed no significant coronary artery disease. Ejection fraction at that time was 45-55% and he was in atrial fibrillation with a rapid rate.    PMH:   has a past medical history of Arthritis, Basal cell carcinoma of cheek, Cerumen impaction (06/05/2016), CHF (congestive heart failure) (HCC), Colon cancer (HCC) (10/27/2016), Complication of anesthesia, Erosive esophagitis (2014), Paget's disease,  Paroxysmal atrial fibrillation (HCC), and Prostate cancer (HCC) (1998).  PSH:    Past Surgical History:  Procedure Laterality Date   BASAL CELL CARCINOMA EXCISION     CARDIAC CATHETERIZATION  2007   No significant CAD   CARDIOVERSION  2010   A-Fib   CATARACT EXTRACTION     COLONOSCOPY  01/1999   radiation proctitis, rec rpt 5 yrs  ([name] [name])   COLONOSCOPY  09/2016   invasive adenocacinoma araising in [place] - referred to surgery, radiation proctitis, rpt 1 yr [name] [name])   ESOPHAGOGASTRODUODENOSCOPY N/A 02/24/2013   esoph stricture dilated, marked erosive esophagitis; Surgeon: [name] LITTIE Celestia Mickey., MD   FLEXIBLE SIGMOIDOSCOPY  10/2016   malignant tumor recto-sigmoid colon, diverticulosis - referred to surgery [name] [name])   HAMMER TOE SURGERY  04/21/2012   amputation for paget's   INGUINAL HERNIA REPAIR  2007   LAPAROSCOPIC SIGMOID COLECTOMY N/A 11/22/2016   path: no residual carcinoma, 13 benign LN, diverticulosis [name] Lunger, MD)   RADIOACTIVE SEED IMPLANT  1998   Prostate cancer [name]   SAVORY [name] N/A 02/24/2013   Procedure: [name] [name];  Surgeon: [name] LITTIE Celestia Mickey., MD;  Location: [facility];  Service: Endoscopy;  Laterality: N/A;   SHOULDER SURGERY Right 2004   RTC    Current Outpatient Medications  Medication Sig Dispense Refill   amiodarone  (PACERONE ) 200 MG tablet TAKE 1 TABLET BY MOUTH EVERY 4 TO 6 HOURS AS NEEDED FOR  A-FIB  EPISODES 60 tablet 0   amiodarone  (PACERONE ) 400 MG tablet one tab orally three times a day as needed     apixaban  (ELIQUIS ) 5 MG TABS tablet Take 1 tablet (5 mg total) by mouth 2 (two) times daily. 180 tablet 3   Ashwagandha 500 MG CAPS 475mg  1 tab Orally     cholecalciferol  (VITAMIN D ) 1000 UNITS tablet Take 5,000 Units by mouth every morning.      Coenzyme Q10 (CO Q 10) 100 MG CAPS 1000 mg Orally     diltiazem  (CARDIZEM  CD) 120 MG 24 hr capsule Take 1 capsule (120 mg total) by mouth daily. 90 capsule 1   fish oil-omega-3 fatty acids 1000 MG capsule Take 1 g by mouth every morning.      glucosamine-chondroitin 500-400 MG tablet Take 1 tablet by mouth daily.     lisinopril  (ZESTRIL ) 20 MG tablet Take 1 tablet by mouth once daily 24 tablet 0   Magnesium  400 MG CAPS Take 400 mg by mouth every morning.      metoprolol  tartrate (LOPRESSOR ) 25 MG tablet TAKE 1 TABLET BY MOUTH  EVERY 6 HOURS AS NEEDED FOR A-FIB EPISODES. DO NOT EXCEED 3 TABLETS PER DAY 60 tablet 0   Misc Natural Products (TART CHERRY ADVANCED) CAPS 500mg  Orally     Multiple Vitamin (MULTIVITAMIN WITH MINERALS) TABS Take 1 tablet by mouth every morning.     NON FORMULARY 2 scoop 2 (two) times daily as needed (calm plus - when in A-fib). Reported on 05/28/2016     NON FORMULARY Co-Q-10 30 mg/L-Carnitine-250 mg/Ashwagandha-475mg      RESVERATROL PO Take 20 mg by mouth daily.     Sodium Caprylate POWD Take by mouth.     Turmeric (QC TUMERIC COMPLEX PO) Take by mouth.     vitamin C  (ASCORBIC ACID ) 500 MG tablet Take 1,000 mg by mouth 2 (two) times daily.      methocarbamol  (ROBAXIN ) 500 MG tablet 1 tablet Orally (Patient not taking: Reported on 12/15/2024)     No current  facility-administered medications for this visit.    Allergies:   Patient has no known allergies.   Social History:  The patient  reports that he quit smoking about 15 years ago. His smoking use included cigars. He has never used smokeless tobacco. He reports that he does not currently use alcohol after a past usage of about 1.0 standard drink of alcohol per week. He reports that he does not use drugs.   Family History:   family history includes Arrhythmia in his mother; Arthritis in his mother; Cancer in his brother; Diabetes in his mother; Stroke in his father.    Review of Systems: Review of Systems  Constitutional: Negative.   HENT: Negative.    Respiratory: Negative.    Cardiovascular:  Positive for palpitations.  Gastrointestinal: Negative.   Musculoskeletal: Negative.   Neurological: Negative.   Psychiatric/Behavioral: Negative.    All other systems reviewed and are negative.  PHYSICAL EXAM: VS:  BP (!) 134/58 (BP Location: Left Arm, Patient Position: Sitting, Cuff Size: Normal)   Pulse (!) 54   Ht 5' 11 (1.803 m)   Wt 199 lb (90.3 kg)   SpO2 96%   BMI 27.75 kg/m  , BMI Body mass index is 27.75 kg/m. Constitutional:   oriented to person, place, and time. No distress.  HENT:  Head: Normocephalic and atraumatic.  Eyes:  no discharge. No scleral icterus.  Neck: Normal range of motion. Neck supple. No JVD present.  Cardiovascular: Normal rate, regular rhythm, normal heart sounds and intact distal pulses. Exam reveals no gallop and no friction rub. No edema No murmur heard. Pulmonary/Chest: Effort normal and breath sounds normal. No stridor. No respiratory distress.  no wheezes.  no rales.  no tenderness.  Abdominal: Soft.  no distension.  no tenderness.  Musculoskeletal: Normal range of motion.  no  tenderness or deformity.  Neurological:  normal muscle tone. Coordination normal. No atrophy Skin: Skin is warm and dry. No rash noted. not diaphoretic.  Psychiatric:  normal mood and affect. behavior is normal. Thought content normal.   Recent Labs: No results found for requested labs within last 365 days.    Lipid Panel Lab Results  Component Value Date   CHOL 200 07/24/2021   HDL 63 07/24/2021   LDLCALC 124 (H) 07/24/2021   TRIG 65 07/24/2021    Wt Readings from Last 3 Encounters:  12/15/24 199 lb (90.3 kg)  08/31/24 190 lb 4.1 oz (86.3 kg)  09/03/23 190 lb 4 oz (86.3 kg)    ASSESSMENT AND PLAN:  Paroxysmal atrial fibrillation (HCC) - Maintaining normal sinus rhythm on today's visit Rare breakthrough episodes of atrial fibrillation,  Recommended he continue eliquis   5 twice a day, rare nosebleeds on diltiazem  ER 120 at noon As needed he will take amio and metoprolol  for breakthrough atrial fibrillation spells and typically converts back to normal sinus rhythm less than 24 hours  Mixed hyperlipidemia - He does not want a statin or Zetia mild aortic atherosclerosis seen on CT scan Total cholesterol 176 LDL 101  Paget's disease of bone - Prior history of elevated alkaline phosphatase, being monitored  History of smoking -  Recommended smoking cessation  HTN takes his lisinopril  and  diltiazem   Blood pressure is well controlled on today's visit. No changes made to the medications.  Aortic atherosclerosis Mild on CT, previously discussed  Mildly dilated ascending aorta 4 cm, by history, no recent evaluation Findings will be discussed on the phone with him as he has already left  the office We will call him to discuss whether he would like echo or CT scan for further evaluation   Orders Placed This Encounter  Procedures   EKG 12-Lead     Signed, Velinda [name], M.D., Ph.D. 12/15/2024  [facility] [place], [place] [phone]

## 2024-12-15 ENCOUNTER — Ambulatory Visit: Admitting: Cardiovascular Disease

## 2024-12-15 ENCOUNTER — Other Ambulatory Visit: Payer: Self-pay | Admitting: Emergency Medicine

## 2024-12-15 VITALS — BP 134/58 | HR 54 | Ht 71.0 in | Wt 199.0 lb

## 2024-12-15 DIAGNOSIS — I471 Supraventricular tachycardia, unspecified: Secondary | ICD-10-CM

## 2024-12-15 DIAGNOSIS — E782 Mixed hyperlipidemia: Secondary | ICD-10-CM

## 2024-12-15 DIAGNOSIS — Z87891 Personal history of nicotine dependence: Secondary | ICD-10-CM

## 2024-12-15 DIAGNOSIS — I10 Essential (primary) hypertension: Secondary | ICD-10-CM

## 2024-12-15 DIAGNOSIS — I480 Paroxysmal atrial fibrillation: Secondary | ICD-10-CM

## 2024-12-15 DIAGNOSIS — I719 Aortic aneurysm of unspecified site, without rupture: Secondary | ICD-10-CM

## 2024-12-15 DIAGNOSIS — E785 Hyperlipidemia, unspecified: Secondary | ICD-10-CM

## 2024-12-15 MED ORDER — DILTIAZEM HCL ER COATED BEADS 120 MG PO CP24: 120.0000 mg | ORAL_CAPSULE | Freq: Every day | ORAL | 3 refills | Status: AC

## 2024-12-15 MED ORDER — AMIODARONE HCL 200 MG PO TABS: 200.0000 mg | ORAL_TABLET | Freq: Two times a day (BID) | ORAL | 1 refills | Status: AC | PRN

## 2024-12-15 MED ORDER — LISINOPRIL 20 MG PO TABS: 20.0000 mg | ORAL_TABLET | Freq: Every day | ORAL | 3 refills | Status: AC

## 2024-12-15 MED ORDER — METOPROLOL TARTRATE 25 MG PO TABS: 25.0000 mg | ORAL_TABLET | Freq: Two times a day (BID) | ORAL | 6 refills | Status: AC | PRN

## 2024-12-15 NOTE — Patient Instructions (Signed)
